# Patient Record
Sex: Male | Born: 1950
Health system: Southern US, Community
[De-identification: ages and names within clinical notes are randomized; demographics above are authoritative.]

## PROBLEM LIST (undated history)

## (undated) DIAGNOSIS — I509 Heart failure, unspecified: Secondary | ICD-10-CM

## (undated) DIAGNOSIS — M1990 Unspecified osteoarthritis, unspecified site: Secondary | ICD-10-CM

## (undated) DIAGNOSIS — K429 Umbilical hernia without obstruction or gangrene: Secondary | ICD-10-CM

## (undated) DIAGNOSIS — M21372 Foot drop, left foot: Secondary | ICD-10-CM

## (undated) DIAGNOSIS — I10 Essential (primary) hypertension: Secondary | ICD-10-CM

## (undated) DIAGNOSIS — M869 Osteomyelitis, unspecified: Secondary | ICD-10-CM

## (undated) DIAGNOSIS — M5126 Other intervertebral disc displacement, lumbar region: Secondary | ICD-10-CM

## (undated) DIAGNOSIS — N289 Disorder of kidney and ureter, unspecified: Secondary | ICD-10-CM

## (undated) HISTORY — PX: FOOT SURGERY: SHX648

## (undated) HISTORY — PX: ACHILLES TENDON SURGERY: SHX542

## (undated) HISTORY — PX: EYE SURGERY: SHX253

---

## 2008-09-03 DIAGNOSIS — M869 Osteomyelitis, unspecified: Secondary | ICD-10-CM

## 2008-09-03 HISTORY — DX: Osteomyelitis, unspecified: M86.9

## 2012-06-03 ENCOUNTER — Other Ambulatory Visit: Payer: Self-pay | Admitting: Orthopedic Surgery

## 2012-06-03 MED ORDER — DEXAMETHASONE SODIUM PHOSPHATE 10 MG/ML IJ SOLN: 10.0000 mg | Freq: Once | INTRAMUSCULAR | Status: DC

## 2012-06-03 MED ORDER — BUPIVACAINE LIPOSOME 1.3 % IJ SUSP: 20.0000 mL | Freq: Once | INTRAMUSCULAR | Status: DC

## 2012-06-03 NOTE — Progress Notes (Signed)
Need orders for surgery pre op is 10-3

## 2012-06-03 NOTE — Progress Notes (Signed)
Preoperative surgical orders have been place into the Epic hospital system for Ethan Alexander on 06/03/2012, 7:58 PM  by Patrica Duel for surgery on 06/11/12.  Preop Total Hip orders including Experel Injecion, IV Tylenol, and IV Decadron as long as there are no contraindications to the above medications. Avel Peace, PA-C

## 2012-06-04 ENCOUNTER — Other Ambulatory Visit: Payer: Self-pay | Admitting: Orthopedic Surgery

## 2012-06-04 ENCOUNTER — Encounter (HOSPITAL_COMMUNITY): Payer: Self-pay | Admitting: Pharmacy Technician

## 2012-06-05 ENCOUNTER — Ambulatory Visit (HOSPITAL_COMMUNITY)
Admission: RE | Admit: 2012-06-05 | Discharge: 2012-06-05 | Disposition: A | Discharge status: Home / Self Care | Payer: BC Managed Care – PPO | Source: Ambulatory Visit | Attending: Orthopedic Surgery | Admitting: Orthopedic Surgery

## 2012-06-05 ENCOUNTER — Encounter (HOSPITAL_COMMUNITY)
Admission: RE | Admit: 2012-06-05 | Discharge: 2012-06-05 | Disposition: A | Discharge status: Home / Self Care | Payer: BC Managed Care – PPO | Source: Ambulatory Visit | Attending: Orthopedic Surgery | Admitting: Orthopedic Surgery

## 2012-06-05 ENCOUNTER — Encounter (HOSPITAL_COMMUNITY): Payer: Self-pay

## 2012-06-05 DIAGNOSIS — Z01812 Encounter for preprocedural laboratory examination: Secondary | ICD-10-CM | POA: Insufficient documentation

## 2012-06-05 DIAGNOSIS — M169 Osteoarthritis of hip, unspecified: Secondary | ICD-10-CM | POA: Insufficient documentation

## 2012-06-05 DIAGNOSIS — I10 Essential (primary) hypertension: Secondary | ICD-10-CM | POA: Insufficient documentation

## 2012-06-05 DIAGNOSIS — M1610 Unilateral primary osteoarthritis, unspecified hip: Secondary | ICD-10-CM | POA: Insufficient documentation

## 2012-06-05 DIAGNOSIS — Z79899 Other long term (current) drug therapy: Secondary | ICD-10-CM | POA: Insufficient documentation

## 2012-06-05 DIAGNOSIS — E119 Type 2 diabetes mellitus without complications: Secondary | ICD-10-CM | POA: Insufficient documentation

## 2012-06-05 HISTORY — DX: Unspecified osteoarthritis, unspecified site: M19.90

## 2012-06-05 HISTORY — DX: Other intervertebral disc displacement, lumbar region: M51.26

## 2012-06-05 HISTORY — DX: Osteomyelitis, unspecified: M86.9

## 2012-06-05 HISTORY — DX: Foot drop, left foot: M21.372

## 2012-06-05 HISTORY — DX: Essential (primary) hypertension: I10

## 2012-06-05 LAB — CBC
HCT: 39.3 % (ref 39.0–52.0)
Hemoglobin: 12.7 g/dL — ABNORMAL LOW (ref 13.0–17.0)
MCHC: 32.3 g/dL (ref 30.0–36.0)
MCV: 92.9 fL (ref 78.0–100.0)
RDW: 14.6 % (ref 11.5–15.5)

## 2012-06-05 LAB — URINALYSIS, ROUTINE W REFLEX MICROSCOPIC
Bilirubin Urine: NEGATIVE
Hgb urine dipstick: NEGATIVE
Nitrite: NEGATIVE
Protein, ur: NEGATIVE mg/dL
Specific Gravity, Urine: 1.016 (ref 1.005–1.030)
Urobilinogen, UA: 0.2 mg/dL (ref 0.0–1.0)

## 2012-06-05 LAB — COMPREHENSIVE METABOLIC PANEL
Albumin: 3 g/dL — ABNORMAL LOW (ref 3.5–5.2)
BUN: 14 mg/dL (ref 6–23)
Creatinine, Ser: 0.74 mg/dL (ref 0.50–1.35)
GFR calc Af Amer: >90 mL/min (ref >90)
Glucose, Bld: 76 mg/dL (ref 70–99)
Total Bilirubin: 0.2 mg/dL — ABNORMAL LOW (ref 0.3–1.2)
Total Protein: 7 g/dL (ref 6.0–8.3)

## 2012-06-05 LAB — PROTIME-INR
INR: 0.94 (ref 0.00–1.49)
Prothrombin Time: 12.5 seconds (ref 11.6–15.2)

## 2012-06-05 LAB — SURGICAL PCR SCREEN: Staphylococcus aureus: POSITIVE — AB

## 2012-06-05 NOTE — Patient Instructions (Addendum)
20 Ethan Alexander  06/05/2012   Your procedure is scheduled on:  06/11/12   Wednesday  Surgery [id number]  Report to [facility] at     1030am Call this number if you have problems the morning of surgery: [phone]       Remember: HAVE SNACK BEFORE BEDTIME OR MIDNIGHT Tuesday NIGHT  Do not eat food  Or drink :After Midnight.  Tuesday NIGHT   Take these medicines the morning of surgery with A SIP OF WATER:   none   DO NOT TAKE ANY BLOOD SUGAR MEDICINE MORNING OF SURGERY.  Contacts, dentures or partial plates can not be worn to surgery  Leave suitcase in the car. After surgery it may be brought to your room.  For patients admitted to the hospital, checkout time is 11:00 AM day of  discharge.             SPECIAL INSTRUCTIONS- SEE  PREPARING FOR SURGERY INSTRUCTION SHEET-     DO NOT WEAR JEWELRY, LOTIONS, POWDERS, OR PERFUMES.  WOMEN-- DO NOT SHAVE LEGS OR UNDERARMS FOR 12 HOURS BEFORE SHOWERS. MEN MAY SHAVE FACE.  Patients discharged the day of surgery will not be allowed to drive home. IF going home the day of surgery, you must have a driver and someone to stay with you for the first 24 hours  Name and phone number of your driver:  wife                                                                      Please read over the following fact sheets that you were given: MRSA Information, Incentive Spirometry Sheet, Blood Transfusion Sheet  Information                                                                                   [name]  PST [phone]  [phone]

## 2012-06-05 NOTE — Progress Notes (Signed)
06/05/12 1325  OBSTRUCTIVE SLEEP APNEA  Have you ever been diagnosed with sleep apnea through a sleep study? No  Do you snore loudly (loud enough to be heard through closed doors)?  1  Do you often feel tired, fatigued, or sleepy during the daytime? 0  Has anyone observed you stop breathing during your sleep? 0  Do you have, or are you being treated for high blood pressure? 1  BMI more than 35 kg/m2? 0  Age over 59 years old? 1  Neck circumference greater than 40 cm/18 inches? 0  Gender: 1  Obstructive Sleep Apnea Score 4   Score 4 or greater  Results sent to PCP

## 2012-06-05 NOTE — Pre-Procedure Instructions (Signed)
Clearance Loleta Books PA  chart

## 2012-06-05 NOTE — Pre-Procedure Instructions (Signed)
When EKG completed per Tamela Gammon today,  I noted after patient left hospital that name did not scan. Cannot verify this was his EKG.  Called wife and informed her it would be repeated day of surgery and why. Told her there would be no charge for today

## 2012-06-06 ENCOUNTER — Encounter (HOSPITAL_COMMUNITY): Payer: Self-pay

## 2012-06-09 ENCOUNTER — Other Ambulatory Visit: Payer: Self-pay | Admitting: Orthopedic Surgery

## 2012-06-09 NOTE — H&P (Signed)
Ethan Alexander  DOB: [birth year] Married / Language: English / Race: White Male  Date of Admission:  06/11/2012  Chief Complaint:  Left Hip Pain  History of Present Illness The patient is a 61 year old male who comes in for a preoperative History and Physical. The patient is scheduled for a left total hip arthroplasty to be performed by Dr. Gus Rankin. Aluisio, MD at [facility] on 06/11/2012. The patient is a 61 year old male who presents for their left hip pain and osteoarthritis. Symptoms reported today include: pain, stiffness and difficulty ambulating. The patient feels that they are doing poorly and report their pain level to be moderate. The following medication has been used for pain control: Ultram. He feels as though the left hip is bothering him all the time. It is limiting what he can and cannot do. He has endstage arthritis of the left hip. At this point, the most radicular means of improving his pain and function would be a total hip arthroplasty.  He would like to go ahead and proceed with left total hip arthroplasty. They have been treated conservatively in the past for the above stated problem and despite conservative measures, they continue to have progressive pain and severe functional limitations and dysfunction. They have failed non-operative management. It is felt that they would benefit from undergoing total joint replacement. Risks and benefits of the procedure have been discussed with the patient and they elect to proceed with surgery. There are no active contraindications to surgery such as ongoing infection or rapidly progressive neurological disease.  Problem List/Past Medical Osteoarthritis, Hip Blood Clot. 1980's - Never did an ultrasound so not confirmed but treated like one. Diabetes Mellitus, Type II Peripheral Neuropathy Rheumatoid Arthritis Osteoporosis High blood pressure Hypercholesterolemia Kidney Stone   Allergies Sulfa Drugs.  Flu-like syndrome   Family History Osteoarthritis. mother Osteoporosis. mother Heart Disease. father Cancer. father Diabetes Mellitus. father   Social History Living situation. live with spouse Marital status. married Most recent primary occupation. Teacher, English as a foreign language Illicit drug use. no Current work status. working full time Drug/Alcohol Rehab (Currently). no Exercise. Exercises daily; does running / walking Number of flights of stairs before winded. 2-3 Tobacco use. former smoker; smoke(d) 2 pack(s) per day Pain Contract. no Previously in rehab. no Tobacco / smoke exposure. no Children. 2 Alcohol use. current drinker; drinks beer; only occasionally per week Post-Surgical Plans. Plan is to go home. Advance Directives. Living Will, Healthcare POA   Medication History Ramipril (5MG  Capsule, Oral) Active. MetFORMIN HCl ER (500MG  Tablet ER 24HR, Oral) Active. Nabumetone (750MG  Tablet, Oral) Active. PredniSONE (10MG  Tablet, Oral) Active. Leflunomide (20MG  Tablet, Oral) Active. Methotrexate Sodium ( Injection) Specific dose unknown - Active. TraMADol HCl (50MG  Tablet, 1 (one) Tablet Oral every 6-8 hours as needed for pain, Taken starting 05/13/2012) Active.   Past Surgical History Foot Surgery. left Cataract Extraction-Bilateral   Review of Systems General:Not Present- Chills, Fever, Night Sweats, Fatigue, Weight Gain, Weight Loss and Memory Loss. Skin:Not Present- Hives, Itching, Rash, Eczema and Lesions. HEENT:Not Present- Tinnitus, Headache, Double Vision, Visual Loss, Hearing Loss and Dentures. Respiratory:Not Present- Shortness of breath with exertion, Shortness of breath at rest, Allergies, Coughing up blood and Chronic Cough. Cardiovascular:Not Present- Chest Pain, Racing/skipping heartbeats, Difficulty Breathing Lying Down, Murmur, Swelling and Palpitations. Gastrointestinal:Not Present- Bloody Stool, Heartburn, Abdominal Pain,  Vomiting, Nausea, Constipation, Diarrhea, Difficulty Swallowing, Jaundice and Loss of appetitie. Male Genitourinary:Present- Urinating at Night. Not Present- Urinary frequency, Blood in Urine, Weak urinary  stream, Discharge, Flank Pain, Incontinence, Painful Urination, Urgency and Urinary Retention. Musculoskeletal:Present- Joint Swelling, Joint Pain, Back Pain and Morning Stiffness. Not Present- Muscle Weakness, Muscle Pain and Spasms. Neurological:Not Present- Tremor, Dizziness, Blackout spells, Paralysis, Difficulty with balance and Weakness. Psychiatric:Not Present- Insomnia.   Vitals Weight: 192 lb Height: 78 in Body Surface Area: 2.19 m Body Mass Index: 22.19 kg/m Pulse: 84 (Regular) Resp.: 14 (Unlabored) BP: 114/64 (Sitting, Left Arm, Standard)    Physical Exam The physical exam findings are as follows:  Note: Patient is a 61 year old male with continued hip pain. Patient is accompanied today by his wife today.   General Mental Status - Alert, cooperative and good historian. General Appearance- pleasant. Not in acute distress. Orientation- Oriented X3. Build & Nutrition- Well nourished and Well developed.   Head and Neck Head- normocephalic, atraumatic . Neck Global Assessment- supple. no bruit auscultated on the right and no bruit auscultated on the left.   Eye Pupil- Bilateral- Regular and Round. Motion- Bilateral- EOMI.   Chest and Lung Exam Auscultation: Breath sounds:- clear at anterior chest wall and - clear at posterior chest wall. Adventitious sounds:- No Adventitious sounds.   Cardiovascular Auscultation:Rhythm- Regular rate and rhythm. Heart Sounds- S1 WNL and S2 WNL. Murmurs & Other Heart Sounds:Auscultation of the heart reveals - No Murmurs.   Abdomen Palpation/Percussion:Tenderness- Abdomen is non-tender to palpation. Rigidity (guarding)- Abdomen is soft. Auscultation:Auscultation of the abdomen  reveals - Bowel sounds normal.   Male Genitourinary Not done, not pertinent to present illness  Musculoskeletal He is alert and oriented in no apparent distress. His right hip has normal motion. No discomfort. Left hip flexion is 90. No internal rotation, about 20 external rotation and 20 abduction. His gait pattern is significantly antalgic. He has a foot drop on the left side from previous back injury.  Assessment & Plan Osteoarthritis, Hip (715.35) Impression: Left Hip  Note: Patinet is for a left total hip replacement by Dr. Lequita Halt.  Plan is to go home.  PCP - Donnel Saxon - Patient has been seen preoperatively and felt to be stable for surgery.  Signed electronically by Roberts Gaudy, PA-C

## 2012-06-11 ENCOUNTER — Ambulatory Visit (HOSPITAL_COMMUNITY): Payer: BC Managed Care – PPO

## 2012-06-11 ENCOUNTER — Encounter (HOSPITAL_COMMUNITY): Payer: Self-pay | Admitting: Registered Nurse

## 2012-06-11 ENCOUNTER — Encounter (HOSPITAL_COMMUNITY): Payer: Self-pay | Admitting: *Deleted

## 2012-06-11 ENCOUNTER — Ambulatory Visit (HOSPITAL_COMMUNITY): Payer: BC Managed Care – PPO | Admitting: Registered Nurse

## 2012-06-11 ENCOUNTER — Encounter (HOSPITAL_COMMUNITY)
Admission: RE | Disposition: A | Discharge status: Home / Self Care | Payer: Self-pay | Source: Ambulatory Visit | Attending: Orthopedic Surgery

## 2012-06-11 ENCOUNTER — Encounter (HOSPITAL_COMMUNITY): Payer: Self-pay

## 2012-06-11 ENCOUNTER — Inpatient Hospital Stay (HOSPITAL_COMMUNITY)
Admission: RE | Admit: 2012-06-11 | Discharge: 2012-06-13 | DRG: 818 | Disposition: A | Discharge status: Home / Self Care | Payer: BC Managed Care – PPO | Source: Ambulatory Visit | Attending: Orthopedic Surgery | Admitting: Orthopedic Surgery

## 2012-06-11 DIAGNOSIS — M169 Osteoarthritis of hip, unspecified: Secondary | ICD-10-CM | POA: Diagnosis present

## 2012-06-11 DIAGNOSIS — M1610 Unilateral primary osteoarthritis, unspecified hip: Principal | ICD-10-CM | POA: Diagnosis present

## 2012-06-11 DIAGNOSIS — Z96649 Presence of unspecified artificial hip joint: Secondary | ICD-10-CM

## 2012-06-11 DIAGNOSIS — I447 Left bundle-branch block, unspecified: Secondary | ICD-10-CM | POA: Diagnosis present

## 2012-06-11 DIAGNOSIS — M069 Rheumatoid arthritis, unspecified: Secondary | ICD-10-CM | POA: Diagnosis present

## 2012-06-11 DIAGNOSIS — M810 Age-related osteoporosis without current pathological fracture: Secondary | ICD-10-CM | POA: Diagnosis present

## 2012-06-11 DIAGNOSIS — Z79899 Other long term (current) drug therapy: Secondary | ICD-10-CM

## 2012-06-11 DIAGNOSIS — E7800 Pure hypercholesterolemia, unspecified: Secondary | ICD-10-CM | POA: Diagnosis present

## 2012-06-11 DIAGNOSIS — I10 Essential (primary) hypertension: Secondary | ICD-10-CM | POA: Diagnosis present

## 2012-06-11 DIAGNOSIS — E119 Type 2 diabetes mellitus without complications: Secondary | ICD-10-CM | POA: Diagnosis present

## 2012-06-11 DIAGNOSIS — Z87891 Personal history of nicotine dependence: Secondary | ICD-10-CM

## 2012-06-11 DIAGNOSIS — D62 Acute posthemorrhagic anemia: Secondary | ICD-10-CM

## 2012-06-11 HISTORY — PX: TOTAL HIP ARTHROPLASTY: SHX124

## 2012-06-11 LAB — TYPE AND SCREEN

## 2012-06-11 LAB — GLUCOSE, CAPILLARY

## 2012-06-11 LAB — ABO/RH: ABO/RH(D): O POS

## 2012-06-11 SURGERY — ARTHROPLASTY, HIP, TOTAL,POSTERIOR APPROACH
Anesthesia: General | Site: Hip | Laterality: Left | Wound class: Clean

## 2012-06-11 MED ORDER — CEFAZOLIN SODIUM-DEXTROSE 2-3 GM-% IV SOLR
INTRAVENOUS | Status: AC
  Filled 2012-06-11: qty 50

## 2012-06-11 MED ORDER — METOCLOPRAMIDE HCL 10 MG PO TABS: 5.0000 mg | ORAL_TABLET | Freq: Three times a day (TID) | ORAL | Status: DC | PRN

## 2012-06-11 MED ORDER — HYDROMORPHONE HCL PF 1 MG/ML IJ SOLN
0.2500 mg | INTRAMUSCULAR | Status: DC | PRN
  Administered 2012-06-11 (×2): 0.5 mg via INTRAVENOUS

## 2012-06-11 MED ORDER — FENTANYL CITRATE 0.05 MG/ML IJ SOLN
INTRAMUSCULAR | Status: DC | PRN
  Administered 2012-06-11: 25 ug via INTRAVENOUS
  Administered 2012-06-11: 100 ug via INTRAVENOUS
  Administered 2012-06-11 (×2): 50 ug via INTRAVENOUS
  Administered 2012-06-11: 25 ug via INTRAVENOUS

## 2012-06-11 MED ORDER — PREDNISONE 20 MG PO TABS
20.0000 mg | ORAL_TABLET | Freq: Two times a day (BID) | ORAL | Status: AC
  Administered 2012-06-12 (×2): 20 mg via ORAL
  Filled 2012-06-11 (×2): qty 1

## 2012-06-11 MED ORDER — RAMIPRIL 5 MG PO TABS: 5.0000 mg | ORAL_TABLET | Freq: Every day | ORAL | Status: DC

## 2012-06-11 MED ORDER — ACETAMINOPHEN 325 MG PO TABS: 650.0000 mg | ORAL_TABLET | Freq: Four times a day (QID) | ORAL | Status: DC | PRN

## 2012-06-11 MED ORDER — PROMETHAZINE HCL 25 MG/ML IJ SOLN: 6.2500 mg | INTRAMUSCULAR | Status: DC | PRN

## 2012-06-11 MED ORDER — CISATRACURIUM BESYLATE (PF) 10 MG/5ML IV SOLN
INTRAVENOUS | Status: DC | PRN
  Administered 2012-06-11: 6 mg via INTRAVENOUS

## 2012-06-11 MED ORDER — ACETAMINOPHEN 10 MG/ML IV SOLN
1000.0000 mg | Freq: Four times a day (QID) | INTRAVENOUS | Status: AC
  Administered 2012-06-11 – 2012-06-12 (×4): 1000 mg via INTRAVENOUS
  Filled 2012-06-11 (×5): qty 100

## 2012-06-11 MED ORDER — METOCLOPRAMIDE HCL 5 MG/ML IJ SOLN: 5.0000 mg | Freq: Three times a day (TID) | INTRAMUSCULAR | Status: DC | PRN

## 2012-06-11 MED ORDER — HYDROCORTISONE SOD SUCCINATE 100 MG IJ SOLR
INTRAMUSCULAR | Status: AC
  Filled 2012-06-11: qty 2

## 2012-06-11 MED ORDER — POTASSIUM CHLORIDE IN NACL 20-0.9 MEQ/L-% IV SOLN
INTRAVENOUS | Status: DC
  Administered 2012-06-11 – 2012-06-12 (×2): via INTRAVENOUS
  Filled 2012-06-11 (×2): qty 1000

## 2012-06-11 MED ORDER — LIDOCAINE HCL (CARDIAC) 20 MG/ML IV SOLN
INTRAVENOUS | Status: DC | PRN
  Administered 2012-06-11: 50 mg via INTRAVENOUS

## 2012-06-11 MED ORDER — POLYETHYLENE GLYCOL 3350 17 G PO PACK
17.0000 g | PACK | Freq: Every day | ORAL | Status: DC | PRN
  Filled 2012-06-11: qty 1

## 2012-06-11 MED ORDER — METHOCARBAMOL 100 MG/ML IJ SOLN
500.0000 mg | Freq: Four times a day (QID) | INTRAVENOUS | Status: DC | PRN
  Administered 2012-06-11: 500 mg via INTRAVENOUS
  Filled 2012-06-11: qty 5

## 2012-06-11 MED ORDER — LACTATED RINGERS IV SOLN
INTRAVENOUS | Status: DC | PRN
  Administered 2012-06-11 (×2): via INTRAVENOUS

## 2012-06-11 MED ORDER — HYDROCORTISONE SOD SUCCINATE 100 MG IJ SOLR
INTRAMUSCULAR | Status: DC | PRN
  Administered 2012-06-11: 100 mg via INTRAVENOUS

## 2012-06-11 MED ORDER — PREDNISONE 20 MG PO TABS
20.0000 mg | ORAL_TABLET | Freq: Every day | ORAL | Status: AC
  Administered 2012-06-11: 20 mg via ORAL
  Filled 2012-06-11: qty 1

## 2012-06-11 MED ORDER — ACETAMINOPHEN 10 MG/ML IV SOLN
INTRAVENOUS | Status: AC
  Filled 2012-06-11: qty 100

## 2012-06-11 MED ORDER — SUCCINYLCHOLINE CHLORIDE 20 MG/ML IJ SOLN
INTRAMUSCULAR | Status: DC | PRN
  Administered 2012-06-11: 100 mg via INTRAVENOUS

## 2012-06-11 MED ORDER — CEFAZOLIN SODIUM 1-5 GM-% IV SOLN
1.0000 g | Freq: Four times a day (QID) | INTRAVENOUS | Status: AC
  Administered 2012-06-11 – 2012-06-12 (×2): 1 g via INTRAVENOUS
  Filled 2012-06-11 (×2): qty 50

## 2012-06-11 MED ORDER — PHENOL 1.4 % MT LIQD: 1.0000 | OROMUCOSAL | Status: DC | PRN

## 2012-06-11 MED ORDER — LACTATED RINGERS IV SOLN: INTRAVENOUS | Status: DC

## 2012-06-11 MED ORDER — PROPOFOL 10 MG/ML IV BOLUS
INTRAVENOUS | Status: DC | PRN
  Administered 2012-06-11: 200 mg via INTRAVENOUS

## 2012-06-11 MED ORDER — PREDNISONE 10 MG PO TABS
10.0000 mg | ORAL_TABLET | Freq: Two times a day (BID) | ORAL | Status: DC
  Filled 2012-06-11: qty 1

## 2012-06-11 MED ORDER — SODIUM CHLORIDE 0.9 % IJ SOLN
INTRAMUSCULAR | Status: DC | PRN
  Administered 2012-06-11: 50 mL

## 2012-06-11 MED ORDER — ONDANSETRON HCL 4 MG/2ML IJ SOLN: 4.0000 mg | Freq: Four times a day (QID) | INTRAMUSCULAR | Status: DC | PRN

## 2012-06-11 MED ORDER — MORPHINE SULFATE 2 MG/ML IJ SOLN
1.0000 mg | INTRAMUSCULAR | Status: DC | PRN
  Administered 2012-06-11: 2 mg via INTRAVENOUS
  Filled 2012-06-11: qty 1

## 2012-06-11 MED ORDER — INSULIN ASPART 100 UNIT/ML ~~LOC~~ SOLN
0.0000 [IU] | Freq: Three times a day (TID) | SUBCUTANEOUS | Status: DC
  Administered 2012-06-11 – 2012-06-12 (×3): 2 [IU] via SUBCUTANEOUS

## 2012-06-11 MED ORDER — RAMIPRIL 5 MG PO CAPS
5.0000 mg | ORAL_CAPSULE | Freq: Every day | ORAL | Status: DC
  Administered 2012-06-12 – 2012-06-13 (×2): 5 mg via ORAL
  Filled 2012-06-11 (×2): qty 1

## 2012-06-11 MED ORDER — ONDANSETRON HCL 4 MG/2ML IJ SOLN
INTRAMUSCULAR | Status: DC | PRN
  Administered 2012-06-11: 4 mg via INTRAVENOUS

## 2012-06-11 MED ORDER — ONDANSETRON HCL 4 MG PO TABS: 4.0000 mg | ORAL_TABLET | Freq: Four times a day (QID) | ORAL | Status: DC | PRN

## 2012-06-11 MED ORDER — PREDNISONE 10 MG PO TABS: 10.0000 mg | ORAL_TABLET | Freq: Every day | ORAL | Status: DC

## 2012-06-11 MED ORDER — TRAMADOL HCL 50 MG PO TABS
50.0000 mg | ORAL_TABLET | Freq: Four times a day (QID) | ORAL | Status: DC | PRN
  Administered 2012-06-12 – 2012-06-13 (×3): 100 mg via ORAL
  Filled 2012-06-11 (×3): qty 2

## 2012-06-11 MED ORDER — RIVAROXABAN 10 MG PO TABS
10.0000 mg | ORAL_TABLET | Freq: Every day | ORAL | Status: DC
  Administered 2012-06-12 – 2012-06-13 (×2): 10 mg via ORAL
  Filled 2012-06-11 (×3): qty 1

## 2012-06-11 MED ORDER — ACETAMINOPHEN 650 MG RE SUPP: 650.0000 mg | Freq: Four times a day (QID) | RECTAL | Status: DC | PRN

## 2012-06-11 MED ORDER — BUPIVACAINE LIPOSOME 1.3 % IJ SUSP
20.0000 mL | Freq: Once | INTRAMUSCULAR | Status: AC
  Administered 2012-06-11: 20 mL
  Filled 2012-06-11: qty 20

## 2012-06-11 MED ORDER — METFORMIN HCL 500 MG PO TABS
500.0000 mg | ORAL_TABLET | Freq: Two times a day (BID) | ORAL | Status: DC
  Administered 2012-06-11 – 2012-06-12 (×2): 500 mg via ORAL
  Filled 2012-06-11 (×4): qty 1

## 2012-06-11 MED ORDER — SODIUM CHLORIDE 0.9 % IV SOLN: INTRAVENOUS | Status: DC

## 2012-06-11 MED ORDER — OXYCODONE HCL 5 MG PO TABS
5.0000 mg | ORAL_TABLET | ORAL | Status: DC | PRN
  Administered 2012-06-11: 10 mg via ORAL
  Filled 2012-06-11: qty 2

## 2012-06-11 MED ORDER — MENTHOL 3 MG MT LOZG: 1.0000 | LOZENGE | OROMUCOSAL | Status: DC | PRN

## 2012-06-11 MED ORDER — FLEET ENEMA 7-19 GM/118ML RE ENEM: 1.0000 | ENEMA | Freq: Once | RECTAL | Status: AC | PRN

## 2012-06-11 MED ORDER — DOCUSATE SODIUM 100 MG PO CAPS
100.0000 mg | ORAL_CAPSULE | Freq: Two times a day (BID) | ORAL | Status: DC
  Administered 2012-06-11 – 2012-06-13 (×4): 100 mg via ORAL
  Filled 2012-06-11 (×3): qty 1

## 2012-06-11 MED ORDER — 0.9 % SODIUM CHLORIDE (POUR BTL) OPTIME
TOPICAL | Status: DC | PRN
  Administered 2012-06-11: 1000 mL

## 2012-06-11 MED ORDER — ACETAMINOPHEN 10 MG/ML IV SOLN
1000.0000 mg | Freq: Once | INTRAVENOUS | Status: AC
  Administered 2012-06-11: 1000 mg via INTRAVENOUS

## 2012-06-11 MED ORDER — MIDAZOLAM HCL 5 MG/5ML IJ SOLN
INTRAMUSCULAR | Status: DC | PRN
  Administered 2012-06-11: 2 mg via INTRAVENOUS

## 2012-06-11 MED ORDER — PREDNISONE 5 MG PO TABS
15.0000 mg | ORAL_TABLET | Freq: Two times a day (BID) | ORAL | Status: DC
  Administered 2012-06-13: 15 mg via ORAL
  Filled 2012-06-11 (×3): qty 1

## 2012-06-11 MED ORDER — DIPHENHYDRAMINE HCL 12.5 MG/5ML PO ELIX: 12.5000 mg | ORAL_SOLUTION | ORAL | Status: DC | PRN

## 2012-06-11 MED ORDER — HYDROMORPHONE HCL PF 1 MG/ML IJ SOLN
INTRAMUSCULAR | Status: DC | PRN
  Administered 2012-06-11 (×2): 1 mg via INTRAVENOUS

## 2012-06-11 MED ORDER — CEFAZOLIN SODIUM-DEXTROSE 2-3 GM-% IV SOLR
2.0000 g | INTRAVENOUS | Status: AC
  Administered 2012-06-11: 2 g via INTRAVENOUS

## 2012-06-11 MED ORDER — BISACODYL 10 MG RE SUPP: 10.0000 mg | Freq: Every day | RECTAL | Status: DC | PRN

## 2012-06-11 MED ORDER — METHOCARBAMOL 500 MG PO TABS
500.0000 mg | ORAL_TABLET | Freq: Four times a day (QID) | ORAL | Status: DC | PRN
  Administered 2012-06-11 – 2012-06-12 (×2): 500 mg via ORAL
  Filled 2012-06-11 (×2): qty 1

## 2012-06-11 MED ORDER — HYDROMORPHONE HCL PF 1 MG/ML IJ SOLN
INTRAMUSCULAR | Status: AC
  Filled 2012-06-11: qty 1

## 2012-06-11 SURGICAL SUPPLY — 49 items
BAG ZIPLOCK 12X15 (MISCELLANEOUS) ×2 IMPLANT
BIT DRILL 2.8X128 (BIT) ×2 IMPLANT
BLADE EXTENDED COATED 6.5IN (ELECTRODE) ×2 IMPLANT
BLADE SAW SAG 73X25 THK (BLADE) ×1
BLADE SAW SGTL 73X25 THK (BLADE) ×1 IMPLANT
CLOTH BEACON ORANGE TIMEOUT ST (SAFETY) ×2 IMPLANT
DECANTER SPIKE VIAL GLASS SM (MISCELLANEOUS) ×2 IMPLANT
DRAPE INCISE IOBAN 66X45 STRL (DRAPES) ×2 IMPLANT
DRAPE ORTHO SPLIT 77X108 STRL (DRAPES) ×2
DRAPE POUCH INSTRU U-SHP 10X18 (DRAPES) ×2 IMPLANT
DRAPE SURG ORHT 6 SPLT 77X108 (DRAPES) ×2 IMPLANT
DRAPE U-SHAPE 47X51 STRL (DRAPES) ×2 IMPLANT
DRSG ADAPTIC 3X8 NADH LF (GAUZE/BANDAGES/DRESSINGS) ×2 IMPLANT
DRSG MEPILEX BORDER 4X4 (GAUZE/BANDAGES/DRESSINGS) ×2 IMPLANT
DRSG MEPILEX BORDER 4X8 (GAUZE/BANDAGES/DRESSINGS) ×2 IMPLANT
DURAPREP 26ML APPLICATOR (WOUND CARE) ×2 IMPLANT
ELECT REM PT RETURN 9FT ADLT (ELECTROSURGICAL) ×2
ELECTRODE REM PT RTRN 9FT ADLT (ELECTROSURGICAL) ×1 IMPLANT
EVACUATOR 1/8 PVC DRAIN (DRAIN) ×2 IMPLANT
FACESHIELD LNG OPTICON STERILE (SAFETY) ×8 IMPLANT
GLOVE BIO SURGEON STRL SZ8 (GLOVE) ×2 IMPLANT
GLOVE BIOGEL PI IND STRL 8 (GLOVE) ×2 IMPLANT
GLOVE BIOGEL PI INDICATOR 8 (GLOVE) ×2
GLOVE ECLIPSE 8.0 STRL XLNG CF (GLOVE) ×2 IMPLANT
GLOVE SURG SS PI 6.5 STRL IVOR (GLOVE) ×4 IMPLANT
GOWN STRL NON-REIN LRG LVL3 (GOWN DISPOSABLE) ×4 IMPLANT
GOWN STRL REIN XL XLG (GOWN DISPOSABLE) ×2 IMPLANT
IMMOBILIZER KNEE 20 (SOFTGOODS) ×2
IMMOBILIZER KNEE 20 THIGH 36 (SOFTGOODS) ×1 IMPLANT
KIT BASIN OR (CUSTOM PROCEDURE TRAY) ×2 IMPLANT
MANIFOLD NEPTUNE II (INSTRUMENTS) ×2 IMPLANT
NDL SAFETY ECLIPSE 18X1.5 (NEEDLE) ×1 IMPLANT
NEEDLE HYPO 18GX1.5 SHARP (NEEDLE) ×1
NS IRRIG 1000ML POUR BTL (IV SOLUTION) ×2 IMPLANT
PACK TOTAL JOINT (CUSTOM PROCEDURE TRAY) ×2 IMPLANT
PASSER SUT SWANSON 36MM LOOP (INSTRUMENTS) ×2 IMPLANT
POSITIONER SURGICAL ARM (MISCELLANEOUS) ×2 IMPLANT
SPONGE GAUZE 4X4 12PLY (GAUZE/BANDAGES/DRESSINGS) ×2 IMPLANT
STRIP CLOSURE SKIN 1/2X4 (GAUZE/BANDAGES/DRESSINGS) ×2 IMPLANT
SUT ETHIBOND NAB CT1 #1 30IN (SUTURE) ×4 IMPLANT
SUT MNCRL AB 4-0 PS2 18 (SUTURE) ×2 IMPLANT
SUT VIC AB 2-0 CT1 27 (SUTURE) ×3
SUT VIC AB 2-0 CT1 TAPERPNT 27 (SUTURE) ×3 IMPLANT
SUT VLOC 180 0 24IN GS25 (SUTURE) ×2 IMPLANT
SYR 50ML LL SCALE MARK (SYRINGE) ×2 IMPLANT
TOWEL OR 17X26 10 PK STRL BLUE (TOWEL DISPOSABLE) ×4 IMPLANT
TOWEL OR NON WOVEN STRL DISP B (DISPOSABLE) ×2 IMPLANT
TRAY FOLEY CATH 14FRSI W/METER (CATHETERS) ×2 IMPLANT
WATER STERILE IRR 1500ML POUR (IV SOLUTION) ×4 IMPLANT

## 2012-06-11 NOTE — Anesthesia Preprocedure Evaluation (Addendum)
Anesthesia Evaluation  Patient identified by MRN, date of birth, ID band Patient awake    Reviewed: Allergy & Precautions, H&P , NPO status , Patient's Chart, lab work & pertinent test results  Airway Mallampati: II TM Distance: >3 FB Neck ROM: Full    Dental No notable dental hx.    Pulmonary former smoker,  breath sounds clear to auscultation  Pulmonary exam normal       Cardiovascular hypertension, Pt. on medications Rhythm:Regular Rate:Normal  LBBB on ECG   Neuro/Psych Left foot drop negative psych ROS   GI/Hepatic negative GI ROS, Neg liver ROS,   Endo/Other  diabetes, Type 2, Oral Hypoglycemic Agents  Renal/GU Renal diseaseH/O kidney stones.  negative genitourinary   Musculoskeletal  (+) Arthritis -, Rheumatoid disorders,    Abdominal   Peds negative pediatric ROS (+)  Hematology negative hematology ROS (+)   Anesthesia Other Findings   Reproductive/Obstetrics negative OB ROS                        Anesthesia Physical Anesthesia Plan  ASA: III  Anesthesia Plan: General   Post-op Pain Management:    Induction: Intravenous  Airway Management Planned:   Additional Equipment:   Intra-op Plan:   Post-operative Plan: Extubation in OR  Informed Consent: I have reviewed the patients History and Physical, chart, labs and discussed the procedure including the risks, benefits and alternatives for the proposed anesthesia with the patient or authorized representative who has indicated his/her understanding and acceptance.   Dental advisory given  Plan Discussed with: CRNA  Anesthesia Plan Comments:         Anesthesia Quick Evaluation

## 2012-06-11 NOTE — Transfer of Care (Signed)
Immediate Anesthesia Transfer of Care Note  Patient: Ethan Alexander  Procedure(s) Performed: Procedure(s) (LRB) with comments: TOTAL HIP ARTHROPLASTY (Left)  Patient Location: PACU  Anesthesia Type: General  Level of Consciousness: awake, alert  and oriented  Airway & Oxygen Therapy: Patient Spontanous Breathing and Patient connected to face mask oxygen  Post-op Assessment: Report given to PACU RN  Post vital signs: Reviewed and stable  Complications: No apparent anesthesia complications

## 2012-06-11 NOTE — H&P (View-Only) (Signed)
Ethan Alexander  DOB: 03/31/1951 Married / Language: English / Race: White Male  Date of Admission:  06/11/2012  Chief Complaint:  Left Hip Pain  History of Present Illness The patient is a 61 year old male who comes in for a preoperative History and Physical. The patient is scheduled for a left total hip arthroplasty to be performed by Dr. Frank V. Aluisio, MD at [facility] on 06/11/2012. The patient is a 60 year old male who presents for their left hip pain and osteoarthritis. Symptoms reported today include: pain, stiffness and difficulty ambulating. The patient feels that they are doing poorly and report their pain level to be moderate. The following medication has been used for pain control: Ultram. He feels as though the left hip is bothering him all the time. It is limiting what he can and cannot do. He has endstage arthritis of the left hip. At this point, the most radicular means of improving his pain and function would be a total hip arthroplasty.  He would like to go ahead and proceed with left total hip arthroplasty. They have been treated conservatively in the past for the above stated problem and despite conservative measures, they continue to have progressive pain and severe functional limitations and dysfunction. They have failed non-operative management. It is felt that they would benefit from undergoing total joint replacement. Risks and benefits of the procedure have been discussed with the patient and they elect to proceed with surgery. There are no active contraindications to surgery such as ongoing infection or rapidly progressive neurological disease.  Problem List/Past Medical Osteoarthritis, Hip Blood Clot. 1980's - Never did an ultrasound so not confirmed but treated like one. Diabetes Mellitus, Type II Peripheral Neuropathy Rheumatoid Arthritis Osteoporosis High blood pressure Hypercholesterolemia Kidney Stone   Allergies Sulfa Drugs.  Flu-like syndrome   Family History Osteoarthritis. mother Osteoporosis. mother Heart Disease. father Cancer. father Diabetes Mellitus. father   Social History Living situation. live with spouse Marital status. 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 no Current work status. working full time Drug/Alcohol Rehab (Currently). no Exercise. Exercises daily; does running / walking Number of flights of stairs before winded. 2-3 Tobacco use. former smoker; smoke(d) 2 pack(s) per day Pain Contract. no Previously in rehab. no Tobacco / smoke exposure. no Children. 2 Alcohol use. current drinker; drinks beer; only occasionally per week Post-Surgical Plans. Plan is to go home. Advance Directives. Living Will, Healthcare POA   Medication History Ramipril (5MG Capsule, Oral) Active. MetFORMIN HCl ER (500MG Tablet ER 24HR, Oral) Active. Nabumetone (750MG Tablet, Oral) Active. PredniSONE (10MG Tablet, Oral) Active. Leflunomide (20MG Tablet, Oral) Active. Methotrexate Sodium ( Injection) Specific dose unknown - Active. TraMADol HCl (50MG Tablet, 1 (one) Tablet Oral every 6-8 hours as needed for pain, Taken starting 05/13/2012) Active.   Past Surgical History Foot Surgery. left Cataract Extraction-Bilateral   Review of Systems General:Not Present- Chills, Fever, Night Sweats, Fatigue, Weight Gain, Weight Loss and Memory Loss. Skin:Not Present- Hives, Itching, Rash, Eczema and Lesions. HEENT:Not Present- Tinnitus, Headache, Double Vision, Visual Loss, Hearing Loss and Dentures. Respiratory:Not Present- Shortness of breath with exertion, Shortness of breath at rest, Allergies, Coughing up blood and Chronic Cough. Cardiovascular:Not Present- Chest Pain, Racing/skipping heartbeats, Difficulty Breathing Lying Down, Murmur, Swelling and Palpitations. Gastrointestinal:Not Present- Bloody Stool, Heartburn, Abdominal Pain,  Vomiting, Nausea, Constipation, Diarrhea, Difficulty Swallowing, Jaundice and Loss of appetitie. Male Genitourinary:Present- Urinating at Night. Not Present- Urinary frequency, Blood in Urine, Weak urinary   stream, Discharge, Flank Pain, Incontinence, Painful Urination, Urgency and Urinary Retention. Musculoskeletal:Present- Joint Swelling, Joint Pain, Back Pain and Morning Stiffness. Not Present- Muscle Weakness, Muscle Pain and Spasms. Neurological:Not Present- Tremor, Dizziness, Blackout spells, Paralysis, Difficulty with balance and Weakness. Psychiatric:Not Present- Insomnia.   Vitals Weight: 192 lb Height: 78 in Body Surface Area: 2.19 m Body Mass Index: 22.19 kg/m Pulse: 84 (Regular) Resp.: 14 (Unlabored) BP: 114/64 (Sitting, Left Arm, Standard)    Physical Exam The physical exam findings are as follows:  Note: Patient is a 61 year old male with continued hip pain. Patient is accompanied today by his wife today.   General Mental Status - Alert, cooperative and good historian. General Appearance- pleasant. Not in acute distress. Orientation- Oriented X3. Build & Nutrition- Well nourished and Well developed.   Head and Neck Head- normocephalic, atraumatic . Neck Global Assessment- supple. no bruit auscultated on the right and no bruit auscultated on the left.   Eye Pupil- Bilateral- Regular and Round. Motion- Bilateral- EOMI.   Chest and Lung Exam Auscultation: Breath sounds:- clear at anterior chest wall and - clear at posterior chest wall. Adventitious sounds:- No Adventitious sounds.   Cardiovascular Auscultation:Rhythm- Regular rate and rhythm. Heart Sounds- S1 WNL and S2 WNL. Murmurs & Other Heart Sounds:Auscultation of the heart reveals - No Murmurs.   Abdomen Palpation/Percussion:Tenderness- Abdomen is non-tender to palpation. Rigidity (guarding)- Abdomen is soft. Auscultation:Auscultation of the abdomen  reveals - Bowel sounds normal.   Male Genitourinary Not done, not pertinent to present illness  Musculoskeletal He is alert and oriented in no apparent distress. His right hip has normal motion. No discomfort. Left hip flexion is 90. No internal rotation, about 20 external rotation and 20 abduction. His gait pattern is significantly antalgic. He has a foot drop on the left side from previous back injury.  Assessment & Plan Osteoarthritis, Hip (715.35) Impression: Left Hip  Note: Patinet is for a left total hip replacement by Dr. Aluisio.  Plan is to go home.  PCP - Imran Haque - Patient has been seen preoperatively and felt to be stable for surgery.  Signed electronically by DREW L Barett Whidbee, PA-C  

## 2012-06-11 NOTE — Op Note (Signed)
Pre-operative diagnosis- Osteoarthritis Left hip  Post-operative diagnosis- Osteoarthritis  Left hip  Procedure-  LeftTotal Hip Arthroplasty  Surgeon- Ethan Alexander. Bertina Guthridge, MD  Assistant- Leilani Able, PA-C   Anesthesia  General  EBL- 300   Drain Hemovac   Complication- None  Condition-PACU - hemodynamically stable.   Brief Clinical Note- Ethan Alexander is a 61 y.o. male with end stage arthritis of his left hip with progressively worsening pain and dysfunction. Pain occurs with activity and rest including pain at night. He has tried analgesics, protected weight bearing and rest without benefit. Pain is too severe to attempt physical therapy. Radiographs demonstrate bone on bone arthritis with subchondral cyst formation. He presents now for left THA.  Procedure in detail-   The patient is brought into the operating room and placed on the operating table. After successful administration of General  anesthesia, the patient is placed in the  Right lateral decubitus position with the  Left side up and held in place with the hip positioner. The lower extremity is isolated from the perineum with plastic drapes and time-out is performed by the surgical team. The lower extremity is then prepped and draped in the usual sterile fashion. A short posterolateral incision is made with a ten blade through the subcutaneous tissue to the level of the fascia lata which is incised in line with the skin incision. The sciatic nerve is palpated and protected and the short external rotators and capsule are isolated from the femur. The hip is then dislocated and the center of the femoral head is marked. A trial prosthesis is placed such that the trial head corresponds to the center of the patients' native femoral head. The resection level is marked on the femoral neck and the resection is made with an oscillating saw. The femoral head is removed and femoral retractors placed to gain access to the femoral canal.  The canal finder is passed into the femoral canal and the canal is thoroughly irrigated with sterile saline to remove the fatty contents. Axial reaming is performed to 17.5  mm, proximal reaming to 10F  and the sleeve machined to a large. A 22 F large trial sleeve is placed into the proximal femur.      The femur is then retracted anteriorly to gain acetabular exposure. Acetabular retractors are placed and the labrum and osteophytes are removed, Acetabular reaming is performed to 59  mm and a 60  mm Pinnacle acetabular shell is placed in anatomic position with excellent purchase. Additional dome screws  were placed. An apex hole eliminator is placed and the permanent 36 mm neutral + 4 Marathon liner is placed into the acetabular shell.      The trial femur is then placed into the femoral canal. The size is 22 x 17  stem with a 36 + 12  neck and a 36 + 6 head with the neck version 10 degrees beyond  the patients' native anteversion. The hip is reduced with excellent stability with full extension and full external rotation, 70 degrees flexion with 40 degrees adduction and 90 degrees internal rotation and 90 degrees of flexion with 70 degrees of internal rotation. The operative leg is placed on top of the non-operative leg and the leg lengths are found to be equal. The trials are then removed and the permanent implant of the same size is impacted into the femoral canal. The ceramic femoral head of the same size as the trial is placed and the hip is reduced with  the same stability parameters. The operative leg is again placed on top of the non-operative leg and the leg lengths are found to be equal.      The wound is then copiously irrigated with saline solution and the capsule and short external rotators are re-attached to the femur through drill holes with Ethibond suture. The fascia lata is closed over a hemovac drain with #1 vicryl suture and the fascia lata, gluteal muscles and subcutaneous tissues are injected  with Exparel 20ml diluted with saline 50ml. The subcutaneous tissues are closed with #1 and2-0 vicryl and the subcuticular layer closed with running 4-0 Monocryl. The drain is hooked to suction, incision cleaned and dried, and steri-srips and a bulky sterile dressing applied. The limb is placed into a knee immobilizer and the patient is awakened and transported to recovery in stable condition.      Please note that a surgical assistant was a medical necessity for this procedure in order to perform it in a safe and expeditious manner. The assistant was necessary to provide retraction to the vital neurovascular structures and to retract and position the limb to allow for anatomic placement of the prosthetic components.  Ethan Alexander Ethan Fedorchak, MD    06/11/2012, 1:53 PM

## 2012-06-11 NOTE — Anesthesia Procedure Notes (Signed)
Procedure Name: Intubation Date/Time: 06/11/2012 12:44 PM Performed by: Hulan Fess Pre-anesthesia Checklist: Patient identified, Emergency Drugs available, Suction available, Patient being monitored and Timeout performed Patient Re-evaluated:Patient Re-evaluated prior to inductionOxygen Delivery Method: Circle system utilized Preoxygenation: Pre-oxygenation with 100% oxygen Intubation Type: IV induction Ventilation: Mask ventilation without difficulty Laryngoscope Size: Mac and 4 Grade View: Grade II Tube type: Oral Number of attempts: 2 Placement Confirmation: ETT inserted through vocal cords under direct vision and positive ETCO2 Secured at: 23 cm Dental Injury: Teeth and Oropharynx as per pre-operative assessment

## 2012-06-11 NOTE — Interval H&P Note (Signed)
History and Physical Interval Note:  06/11/2012 12:31 PM  Ethan Alexander  has presented today for surgery, with the diagnosis of Osteoarthritis of the Left Hip  The various methods of treatment have been discussed with the patient and family. After consideration of risks, benefits and other options for treatment, the patient has consented to  Procedure(s) (LRB) with comments: TOTAL HIP ARTHROPLASTY (Left) as a surgical intervention .  The patient's history has been reviewed, patient examined, no change in status, stable for surgery.  I have reviewed the patient's chart and labs.  Questions were answered to the patient's satisfaction.     Loanne Drilling

## 2012-06-11 NOTE — Anesthesia Postprocedure Evaluation (Signed)
  Anesthesia Post-op Note  Patient: Ethan Alexander  Procedure(s) Performed: Procedure(s) (LRB): TOTAL HIP ARTHROPLASTY (Left)  Patient Location: PACU  Anesthesia Type: General  Level of Consciousness: awake and alert   Airway and Oxygen Therapy: Patient Spontanous Breathing  Post-op Pain: mild  Post-op Assessment: Post-op Vital signs reviewed, Patient's Cardiovascular Status Stable, Respiratory Function Stable, Patent Airway and No signs of Nausea or vomiting  Post-op Vital Signs: stable  Complications: No apparent anesthesia complications

## 2012-06-12 ENCOUNTER — Encounter (HOSPITAL_COMMUNITY): Payer: Self-pay | Admitting: Orthopedic Surgery

## 2012-06-12 LAB — GLUCOSE, CAPILLARY: Glucose-Capillary: 129 mg/dL — ABNORMAL HIGH (ref 70–99)

## 2012-06-12 LAB — CBC
Hemoglobin: 11 g/dL — ABNORMAL LOW (ref 13.0–17.0)
MCH: 29.6 pg (ref 26.0–34.0)
MCHC: 32.1 g/dL (ref 30.0–36.0)
MCV: 92.2 fL (ref 78.0–100.0)
Platelets: 362 10*3/uL (ref 150–400)
RBC: 3.72 MIL/uL — ABNORMAL LOW (ref 4.22–5.81)

## 2012-06-12 LAB — BASIC METABOLIC PANEL
CO2: 27 mEq/L (ref 19–32)
Calcium: 8.4 mg/dL (ref 8.4–10.5)
Creatinine, Ser: 0.7 mg/dL (ref 0.50–1.35)
GFR calc non Af Amer: >90 mL/min (ref >90)
Glucose, Bld: 145 mg/dL — ABNORMAL HIGH (ref 70–99)
Sodium: 135 mEq/L (ref 135–145)

## 2012-06-12 NOTE — Evaluation (Signed)
Occupational Therapy Evaluation Patient Details Name: Ethan Alexander MRN: [id number] DOB: 11/11/1950 Today's Date: 06/12/2012 Time: 2841-3244 OT Time Calculation (min): 38 min  OT Assessment / Plan / Recommendation Clinical Impression  Pt doing well POD 1 LTHR. Skilled OT indicated to maximize independence with BADLs to supervision level in prep for d/c home with prn A from family.    OT Assessment  Patient needs continued OT Services    Follow Up Recommendations  No OT follow up    Barriers to Discharge      Equipment Recommendations  Rolling walker with 5" wheels;3 in 1 bedside comode    Recommendations for Other Services    Frequency  Min 2X/week    Precautions / Restrictions Precautions Precautions: Posterior Hip Precaution Comments: verbally reviewed and demonstrated hip precautions Restrictions Weight Bearing Restrictions: No LLE Weight Bearing: Weight bearing as tolerated   Pertinent Vitals/Pain Pt stated pain became worse as session progressed. RN made aware and repositioned for comfort.    ADL  Grooming: Performed;Wash/dry hands;Min guard Where Assessed - Grooming: Unsupported standing Upper Body Bathing: Simulated;Set up Where Assessed - Upper Body Bathing: Unsupported sitting Lower Body Bathing: Simulated;Minimal assistance Where Assessed - Lower Body Bathing: Supported sit to stand Upper Body Dressing: Simulated;Set up Where Assessed - Upper Body Dressing: Unsupported sitting Lower Body Dressing: Simulated;Moderate assistance Where Assessed - Lower Body Dressing: Supported sit to Pharmacist, hospital: Performed;Minimal Dentist Method: Sit to Barista: Raised toilet seat with arms (or 3-in-1 over toilet) Toileting - Clothing Manipulation and Hygiene: Performed;Min guard Where Assessed - Glass blower/designer Manipulation and Hygiene: Sit to stand from 3-in-1 or toilet Equipment Used: Rolling walker;Gait  belt Transfers/Ambulation Related to ADLs: Pt ambulated to the bathroom with min A and VCs for safe manipulation of RW around the bathroom. Cues to keep walker close to body when standing at the sink. ADL Comments: Will educate in the use of AE. Pt/family to obtain hip kit.    OT Diagnosis: Generalized weakness  OT Problem List: Decreased activity tolerance;Pain;Decreased knowledge of use of DME or AE OT Treatment Interventions: Self-care/ADL training;Therapeutic activities;DME and/or AE instruction;Patient/family education   OT Goals Acute Rehab OT Goals OT Goal Formulation: With patient/family Time For Goal Achievement: 06/19/12 Potential to Achieve Goals: Good ADL Goals Pt Will Perform Grooming: with supervision;Standing at sink ADL Goal: Grooming - Progress: Goal set today Pt Will Perform Lower Body Bathing: with supervision;Sit to stand from chair;Sit to stand from bed;with adaptive equipment ADL Goal: Lower Body Bathing - Progress: Goal set today Pt Will Perform Lower Body Dressing: with supervision;Sit to stand from bed;Sit to stand from chair ADL Goal: Lower Body Dressing - Progress: Goal set today Pt Will Transfer to Toilet: with supervision;3-in-1;Raised toilet seat with arms;Ambulation;Maintaining hip precautions ADL Goal: Toilet Transfer - Progress: Goal set today Pt Will Perform Toileting - Clothing Manipulation: with supervision;Sitting on 3-in-1 or toilet;Standing ADL Goal: Toileting - Clothing Manipulation - Progress: Goal set today Pt Will Perform Toileting - Hygiene: with supervision;Sit to stand from 3-in-1/toilet ADL Goal: Toileting - Hygiene - Progress: Goal set today Pt Will Perform Tub/Shower Transfer: Shower transfer;with supervision;Maintaining hip precautions;Ambulation ADL Goal: Tub/Shower Transfer - Progress: Goal set today  Visit Information  Assistance Needed: +1 PT/OT Co-Evaluation/Treatment: Yes    Subjective Data  Subjective: This isnt as bad as I  thought it was going to be... Patient Stated Goal: Return home tomorrow.   Prior Functioning     Home Living Lives With:  Spouse Available Help at Discharge: Family Type of Home: House Home Access: Stairs to enter [employer] of Steps: 4 Entrance Stairs-Rails: Right Home Layout: Two level;Able to live on main level with bedroom/bathroom Bathroom Shower/Tub: Health visitor: Standard Home Adaptive Equipment: Grab bars in shower;Walker - rolling;Straight cane;Crutches Prior Function Level of Independence: Independent Able to Take Stairs?: Yes Driving: Yes Vocation: Full time employment Communication Communication: No difficulties Dominant Hand: Right         Vision/Perception     Cognition  Overall Cognitive Status: Appears within functional limits for tasks assessed/performed Arousal/Alertness: Awake/alert Orientation Level: Appears intact for tasks assessed Behavior During Session: [facility] for tasks performed    Extremity/Trunk Assessment Right Upper Extremity Assessment RUE ROM/Strength/Tone: [facility] for tasks assessed Left Upper Extremity Assessment LUE ROM/Strength/Tone: WFL for tasks assessed Right Lower Extremity Assessment RLE ROM/Strength/Tone: [facility] for tasks assessed Left Lower Extremity Assessment LLE ROM/Strength/Tone: Deficits LLE ROM/Strength/Tone Deficits: foot drop on L. hip flex 2/5, knee ext 3/5.  Trunk Assessment Trunk Assessment: Normal     Mobility Bed Mobility Bed Mobility: Supine to Sit Supine to Sit: 4: Min assist Details for Bed Mobility Assistance: Assist for L LE off bed. VCs safety, technique, handplacement.  Transfers Sit to Stand: 4: Min assist;From bed;From elevated surface;With upper extremity assist Stand to Sit: 4: Min assist;To chair/3-in-1;With armrests;With upper extremity assist Details for Transfer Assistance: VCs safety, technique, handplacement. Assist to rise, stabilize, control descent.       Shoulder Instructions     Exercise     Balance     End of Session OT - End of Session Equipment Utilized During Treatment: Gait belt Activity Tolerance: Patient tolerated treatment well Patient left: in chair;with call bell/phone within reach;with family/visitor present Nurse Communication: Patient requests pain meds  GO     Francenia Chimenti A OTR/L [phone] 06/12/2012, 2:15 PM

## 2012-06-12 NOTE — Care Management Note (Signed)
    Page 1 of 2   06/12/2012     11:45:41 AM   CARE MANAGEMENT NOTE 06/12/2012  Patient:  Ethan Alexander, Ethan Alexander   Account Number:  [id number]  Date Initiated:  06/12/2012  Documentation initiated by:  Lorenda Ishihara  Subjective/Objective Assessment:   61 yo male admitted s/p LeftTotal Hip Arthroplasty. PTA lived at home with spouse.     Action/Plan:   Assist with arranging HH services.   Anticipated DC Date:  06/13/2012   Anticipated DC Plan:  HOME W HOME HEALTH SERVICES      DC Planning Services  CM consult      PAC Choice  DURABLE MEDICAL EQUIPMENT  HOME HEALTH   Choice offered to / List presented to:  C-1 Patient   DME arranged  3-N-1  Levan Hurst      DME agency  [facility]     [facility] arranged  HH-2 PT      Status of service:  Completed, signed off Medicare Important Message given?   (If response is "NO", the following Medicare IM given date fields will be blank) Date Medicare IM given:   Date Additional Medicare IM given:    Discharge Disposition:  HOME W HOME HEALTH SERVICES  Per UR Regulation:  Reviewed for med. necessity/level of care/duration of stay  If discussed at Long Length of Stay Meetings, dates discussed:    Comments:  06-12-12 Lorenda Ishihara RN CM [id number] PT evaluations recommend HHPT, patient prearranged with [facility]. Contacted Debbie with [facility], she is following patient for d/c needs, she is arranging DME.

## 2012-06-12 NOTE — Evaluation (Signed)
Physical Therapy Evaluation Patient Details Name: Ethan Alexander MRN: [id number] DOB: Jul 15, 1951 Today's Date: 06/12/2012 Time: 4098-1191 PT Time Calculation (min): 38 min  PT Assessment / Plan / Recommendation Clinical Impression  61 yo male s/p L THA. Mobilizing fairly well. Has L AFO and modified shoes that he prefers to ambulate in. Plan is for home with family assist. Recommend HHPT,RW.     PT Assessment  Patient needs continued PT services    Follow Up Recommendations  Home health PT    Does the patient have the potential to tolerate intense rehabilitation      Barriers to Discharge        Equipment Recommendations  Rolling walker with 5" wheels;3 in 1 bedside comode    Recommendations for Other Services OT consult   Frequency 7X/week    Precautions / Restrictions Precautions Precautions: Posterior Hip Precaution Comments: verbally reviewed and demonstrated hip precautions Restrictions Weight Bearing Restrictions: No LLE Weight Bearing: Weight bearing as tolerated Other Position/Activity Restrictions: WBAT   Pertinent Vitals/Pain 5/10 L hip      Mobility  Bed Mobility Bed Mobility: Supine to Sit Supine to Sit: 4: Min assist Details for Bed Mobility Assistance: Assist for L LE off bed. VCs safety, technique, handplacement.  Transfers Transfers: Sit to Stand;Stand to Sit Sit to Stand: 4: Min assist;From bed;From elevated surface;With upper extremity assist Stand to Sit: 4: Min assist;To chair/3-in-1;With armrests;With upper extremity assist Details for Transfer Assistance: VCs safety, technique, handplacement. Assist to rise, stabilize, control descent.  Ambulation/Gait Ambulation/Gait Assistance: 4: Min assist Ambulation Distance (Feet): 80 Feet Assistive device: Rolling walker Ambulation/Gait Assistance Details: VCs safety, technique, sequence. Assist to stabilize throughout ambulation.  Gait Pattern: Step-to pattern;Decreased stride length      Shoulder Instructions     Exercises     PT Diagnosis: Difficulty walking;Abnormality of gait;Generalized weakness;Acute pain  PT Problem List: Decreased strength;Decreased range of motion;Decreased mobility;Decreased activity tolerance;Pain;Decreased knowledge of precautions;Decreased knowledge of use of DME PT Treatment Interventions: DME instruction;Gait training;Stair training;Functional mobility training;Therapeutic activities;Therapeutic exercise;Patient/family education   PT Goals Acute Rehab PT Goals PT Goal Formulation: With patient Time For Goal Achievement: 06/26/12 Potential to Achieve Goals: Good Pt will go Supine/Side to Sit: with supervision PT Goal: Supine/Side to Sit - Progress: Goal set today Pt will go Sit to Supine/Side: with supervision PT Goal: Sit to Supine/Side - Progress: Goal set today Pt will go Sit to Stand: with supervision PT Goal: Sit to Stand - Progress: Goal set today Pt will Ambulate: 51 - 150 feet;with supervision;with rolling walker PT Goal: Ambulate - Progress: Goal set today Pt will Go Up / Down Stairs: 3-5 stairs;with min assist;with least restrictive assistive device;with rail(s) PT Goal: Up/Down Stairs - Progress: Goal set today  Visit Information  Last PT Received On: 06/12/12    Subjective Data  Subjective: "This was better than dangling last night" Patient Stated Goal: Home Friday   Prior Functioning  Home Living Lives With: Spouse Available Help at Discharge: Family Type of Home: House Home Access: Stairs to enter [employer] of Steps: 4 Entrance Stairs-Rails: Right Home Layout: Two level;Able to live on main level with bedroom/bathroom Bathroom Shower/Tub: Health visitor: Standard Home Adaptive Equipment: Grab bars in shower;Walker - rolling;Straight cane;Crutches Prior Function Level of Independence: Independent Able to Take Stairs?: Yes Driving: Yes Vocation: Full time  employment Communication Communication: No difficulties Dominant Hand: Right    Cognition  Overall Cognitive Status: Appears within functional limits for tasks assessed/performed Arousal/Alertness:  Awake/Alexander Orientation Level: Appears intact for tasks assessed Behavior During Session: [facility] for tasks performed    Extremity/Trunk Assessment Right Lower Extremity Assessment RLE ROM/Strength/Tone: [facility] for tasks assessed Left Lower Extremity Assessment LLE ROM/Strength/Tone: Deficits LLE ROM/Strength/Tone Deficits: foot drop on L. hip flex 2/5, knee ext 3/5.  Trunk Assessment Trunk Assessment: Normal   Balance    End of Session PT - End of Session Equipment Utilized During Treatment: Gait belt;Left ankle foot orthosis Activity Tolerance: Patient tolerated treatment well Patient left: in chair;with call bell/phone within reach;with family/visitor present  GP     Ethan Alexander [facility] 06/12/2012, 11:37 AM [phone]

## 2012-06-12 NOTE — Progress Notes (Signed)
Called report to 6th floor, patient with stable vital signs this shift, dressing is clean dry and intact, ultram 100 mg given prior to transfer to floor Means, Donye Dauenhauer N Rn 17:15pm

## 2012-06-12 NOTE — Progress Notes (Addendum)
   Subjective: 1 Day Post-Op Procedure(s) (LRB): TOTAL HIP ARTHROPLASTY (Left) Patient reports pain as mild.   Patient seen in rounds with Dr. Lequita Halt. Patient is well, and has had no acute complaints or problems We will start therapy today.  Plan is to go Home after hospital stay.  Objective: Vital signs in last 24 hours: Temp:  [96.9 F (36.1 C)-98.5 F (36.9 C)] 97.5 F (36.4 C) (10/10 1006) Pulse Rate:  [70-88] 77  (10/10 1006) Resp:  [10-20] 20  (10/10 1006) BP: (118-176)/(66-96) 124/66 mmHg (10/10 1006) SpO2:  [97 %-100 %] 99 % (10/10 1006) Weight:  [87.091 kg (192 lb)] 87.091 kg (192 lb) (10/09 1533)  Intake/Output from previous day:  Intake/Output Summary (Last 24 hours) at 06/12/12 1133 Last data filed at 06/12/12 1006  Gross per 24 hour  Intake 4646.25 ml  Output   3015 ml  Net 1631.25 ml    Intake/Output this shift: Total I/O In: 240 [P.O.:240] Out: 875 [Urine:875]  Labs:  [facility] 06/12/12 0350  HGB 11.0*    Basename 06/12/12 0350  WBC 8.5  RBC 3.72*  HCT 34.3*  PLT 362    Basename 06/12/12 0350  NA 135  K 4.3  CL 102  CO2 27  BUN 10  CREATININE 0.70  GLUCOSE 145*  CALCIUM 8.4   No results found for this basename: LABPT:2,INR:2 in the last 72 hours  EXAM General - Patient is Alert, Appropriate and Oriented Extremity - Neurovascular intact Sensation intact distally Dorsiflexion/Plantar flexion intact Dressing - dressing C/D/I Motor Function - He has a foot drop on the left side from previous back injury. Hemovac pulled without difficulty.  Past Medical History  Diagnosis Date  . Hypertension   . Diabetes mellitus   . Arthritis   . Ruptured lumbar disc     L5-S1   x 2 disc  . Kidney stones   . Foot drop, left   . Osteomyelitis 2010    left foot  . Osteomyelitis 2008/2010    left  foot- metatarsal- per patient was MRSA    Assessment/Plan: 1 Day Post-Op Procedure(s) (LRB): TOTAL HIP ARTHROPLASTY (Left) Principal  Problem:  *OA (osteoarthritis) of hip  Estimated Body mass index is 22.19 kg/(m^2) as calculated from the following:   Height as of this encounter: 6\' 6" (1.981 m).   Weight as of this encounter: 192 lb(87.091 kg). Advance diet Up with therapy Plan for discharge tomorrow Discharge home with home health  DVT Prophylaxis - Xarelto Weight Bearing As Tolerated left Leg D/C Knee Immobilizer Hemovac Pulled Begin Therapy Hip Preacutions No vaccines. Started steroid taper postop  PERKINS, ALEXZANDREW 06/12/2012, 11:33 AM

## 2012-06-12 NOTE — Progress Notes (Signed)
Physical Therapy Treatment Patient Details Name: Ethan Alexander MRN: [id number] DOB: Mar 26, 1951 Today's Date: 06/12/2012 Time: 4098-1191 PT Time Calculation (min): 37 min  PT Assessment / Plan / Recommendation Comments on Treatment Session  Mobilizing well. Plan is for home Fri afternoon.     Follow Up Recommendations  Home health PT     Does the patient have the potential to tolerate intense rehabilitation     Barriers to Discharge        Equipment Recommendations  Rolling walker with 5" wheels;3 in 1 bedside comode    Recommendations for Other Services OT consult  Frequency 7X/week   Plan Discharge plan remains appropriate    Precautions / Restrictions Precautions Precautions: Posterior Hip Restrictions Weight Bearing Restrictions: No LLE Weight Bearing: Weight bearing as tolerated   Pertinent Vitals/Pain 4/10 L hip    Mobility  Bed Mobility Bed Mobility: Sit to Supine Sit to Supine: 4: Min assist Details for Bed Mobility Assistance: Assist for L LE onto bed. VCs safety, technique, hand placement.  Transfers Transfers: Sit to Stand;Stand to Sit Sit to Stand: 4: Min assist;From chair/3-in-1 Stand to Sit: 4: Min assist;To bed;To elevated surface Details for Transfer Assistance: VCs safety, technique, hand placement. Assist to rise, stabilize, control descent.  Ambulation/Gait Ambulation/Gait Assistance: 4: Min guard Ambulation Distance (Feet): 135 Feet Assistive device: Rolling walker Ambulation/Gait Assistance Details: VCs safety, technique, sequence. Gait Pattern: Step-to pattern;Decreased stride length    Exercises Total Joint Exercises Ankle Circles/Pumps: AROM;Both;10 reps;Supine Quad Sets: AROM;Both;10 reps;Supine Short Arc Quad: AROM;Left;10 reps;Supine Heel Slides: AAROM;10 reps;Left;Supine Hip ABduction/ADduction: AAROM;Left;10 reps;Supine   PT Diagnosis:    PT Problem List:   PT Treatment Interventions:     PT Goals Acute Rehab PT Goals Pt  will go Sit to Supine/Side: with supervision PT Goal: Sit to Supine/Side - Progress: Progressing toward goal Pt will go Sit to Stand: with supervision PT Goal: Sit to Stand - Progress: Progressing toward goal Pt will Ambulate: 51 - 150 feet;with supervision;with rolling walker PT Goal: Ambulate - Progress: Progressing toward goal  Visit Information  Last PT Received On: 06/12/12 Assistance Needed: +1    Subjective Data  Subjective: "It wasn't too bad today" Patient Stated Goal: HOme friday   Cognition  Overall Cognitive Status: Appears within functional limits for tasks assessed/performed Arousal/Alertness: Awake/alert Orientation Level: Appears intact for tasks assessed Behavior During Session: [facility] for tasks performed    Balance     End of Session PT - End of Session Equipment Utilized During Treatment: Gait belt;Left ankle foot orthosis Activity Tolerance: Patient tolerated treatment well Patient left: in bed;with call bell/phone within reach   GP     Ethan Alexander 06/12/2012, 4:06 PM [phone]

## 2012-06-13 LAB — CBC
HCT: 31.5 % — ABNORMAL LOW (ref 39.0–52.0)
Hemoglobin: 10.2 g/dL — ABNORMAL LOW (ref 13.0–17.0)
MCH: 29.8 pg (ref 26.0–34.0)
MCV: 92.1 fL (ref 78.0–100.0)
RBC: 3.42 MIL/uL — ABNORMAL LOW (ref 4.22–5.81)
WBC: 7.5 10*3/uL (ref 4.0–10.5)

## 2012-06-13 LAB — BASIC METABOLIC PANEL
BUN: 9 mg/dL (ref 6–23)
CO2: 28 mEq/L (ref 19–32)
Calcium: 8.5 mg/dL (ref 8.4–10.5)
Chloride: 102 mEq/L (ref 96–112)
Creatinine, Ser: 0.69 mg/dL (ref 0.50–1.35)
Glucose, Bld: 103 mg/dL — ABNORMAL HIGH (ref 70–99)

## 2012-06-13 MED ORDER — PREDNISONE 5 MG PO TABS: 5.0000 mg | ORAL_TABLET | ORAL | Status: DC

## 2012-06-13 MED ORDER — OXYCODONE HCL 5 MG PO TABS: 5.0000 mg | ORAL_TABLET | ORAL | Status: DC | PRN

## 2012-06-13 MED ORDER — RIVAROXABAN 10 MG PO TABS: 10.0000 mg | ORAL_TABLET | Freq: Every day | ORAL | Status: DC

## 2012-06-13 MED ORDER — METHOCARBAMOL 500 MG PO TABS: 500.0000 mg | ORAL_TABLET | Freq: Four times a day (QID) | ORAL | Status: DC | PRN

## 2012-06-13 NOTE — Progress Notes (Signed)
Physical Therapy Treatment Patient Details Name: Ethan Alexander MRN: [id number] DOB: 09-23-1950 Today's Date: 06/13/2012 Time: 1478-2956 PT Time Calculation (min): 37 min  PT Assessment / Plan / Recommendation Comments on Treatment Session  POD #2 Posterior THR.  With spouse present, practiced negociating stairs, instructed on proper tranfers in/out car, HEP and TH percautions.    Follow Up Recommendations  Home health PT     Does the patient have the potential to tolerate intense rehabilitation     Barriers to Discharge        Equipment Recommendations       Recommendations for Other Services    Frequency 7X/week   Plan Discharge plan remains appropriate    Precautions / Restrictions Precautions Precautions: Posterior Hip Precaution Comments: Able to recall 3/3 hip precautions.  Wear L AFO for foot drop. Restrictions Weight Bearing Restrictions: No LLE Weight Bearing: Weight bearing as tolerated    Pertinent Vitals/Pain "soreness" "tightness"    Mobility  Bed Mobility Bed Mobility: Not assessed Details for Bed Mobility Assistance: Pt sitting EOB on arrival with spouse applying shoes.  Transfers Transfers: Sit to Stand;Stand to Sit Sit to Stand: 5: Supervision;From chair/3-in-1 Stand to Sit: 5: Supervision;To chair/3-in-1 Details for Transfer Assistance: <25% VC's to avoid hip flex > 90' by advancing L LE and keeping his "nose in the air".  Ambulation/Gait Ambulation/Gait Assistance: 5: Supervision Ambulation Distance (Feet): 225 Feet Assistive device: Rolling walker Ambulation/Gait Assistance Details: With spouse and instruction on safe handling.  <25% VC's on safety with turns and backward agit sequencing. Gait Pattern: Step-to pattern;Decreased stride length;Decreased stance time - left  Stairs: Yes Stair Management Technique: One rail Left;With crutches (with spouse present for family education) Number of Stairs: 4      PT Goals progressing    Visit  Information  Last PT Received On: 06/13/12 Assistance Needed: +1                   End of Session PT - End of Session Equipment Utilized During Treatment: Gait belt Activity Tolerance: Patient tolerated treatment well Patient left: in chair;with call bell/phone within reach;with family/visitor present Nurse Communication: Other (comment) (pt ready for D/C to home)   Felecia Shelling  PTA [facility]  Acute  Rehab Pager     [phone]

## 2012-06-13 NOTE — Progress Notes (Signed)
   Subjective: 2 Days Post-Op Procedure(s) (LRB): TOTAL HIP ARTHROPLASTY (Left) Patient reports pain as mild.   Patient seen in rounds with Dr. Lequita Halt.  He is ready to go home today. Patient is well, and has had no acute complaints or problems Patient is ready to go home.  Objective: Vital signs in last 24 hours: Temp:  [97.5 F (36.4 C)-98.3 F (36.8 C)] 98.1 F (36.7 C) (10/11 0623) Pulse Rate:  [76-91] 76  (10/11 0623) Resp:  [20] 20  (10/11 0623) BP: (109-124)/(61-77) 124/74 mmHg (10/11 0623) SpO2:  [95 %-99 %] 95 % (10/11 0623)  Intake/Output from previous day:  Intake/Output Summary (Last 24 hours) at 06/13/12 0834 Last data filed at 06/13/12 7829  Gross per 24 hour  Intake    720 ml  Output   2226 ml  Net  -1506 ml    Intake/Output this shift:    Labs:  Basename 06/13/12 0350 06/12/12 0350  HGB 10.2* 11.0*    Basename 06/13/12 0350 06/12/12 0350  WBC 7.5 8.5  RBC 3.42* 3.72*  HCT 31.5* 34.3*  PLT 345 362    Basename 06/13/12 0350 06/12/12 0350  NA 138 135  K 3.8 4.3  CL 102 102  CO2 28 27  BUN 9 10  CREATININE 0.69 0.70  GLUCOSE 103* 145*  CALCIUM 8.5 8.4   No results found for this basename: LABPT:2,INR:2 in the last 72 hours  EXAM: General - Patient is Alert, Appropriate and Oriented Extremity - Intact pulses distally No cellulitis present Incision - clean, dry, no drainage, healing Motor Function - He has a foot drop on the left side from previous back injury.  Assessment/Plan: 2 Days Post-Op Procedure(s) (LRB): TOTAL HIP ARTHROPLASTY (Left) Procedure(s) (LRB): TOTAL HIP ARTHROPLASTY (Left) Past Medical History  Diagnosis Date  . Hypertension   . Diabetes mellitus   . Arthritis   . Ruptured lumbar disc     L5-S1   x 2 disc  . Kidney stones   . Foot drop, left   . Osteomyelitis 2010    left foot  . Osteomyelitis 2008/2010    left  foot- metatarsal- per patient was MRSA   Principal Problem:  *OA (osteoarthritis) of  hip  Estimated Body mass index is 22.19 kg/(m^2) as calculated from the following:   Height as of this encounter: 6\' 6" (1.981 m).   Weight as of this encounter: 192 lb(87.091 kg). Discharge home with home health Diet - Cardiac diet and Diabetic diet Follow up - in 2 weeks Activity - WBAT Disposition - Home Condition Upon Discharge - Good D/C Meds - See DC Summary DVT Prophylaxis - Xarelto  Akisha Sturgill 06/13/2012, 8:34 AM

## 2012-06-13 NOTE — Progress Notes (Signed)
Occupational Therapy Treatment Patient Details Name: Ethan Alexander MRN: [id number] DOB: 05/02/1951 Today's Date: 06/13/2012 Time: 4098-1191 OT Time Calculation (min): 38 min  OT Assessment / Plan / Recommendation Comments on Treatment Session Pt progressing well. D/C planned for today.    Follow Up Recommendations  No OT follow up    Barriers to Discharge       Equipment Recommendations  Other (comment) (Equip already delivered)    Recommendations for Other Services    Frequency Min 2X/week   Plan Discharge plan remains appropriate    Precautions / Restrictions Precautions Precautions: Posterior Hip Precaution Comments: Able to recall 3/3 hip precautions. Restrictions Weight Bearing Restrictions: No LLE Weight Bearing: Weight bearing as tolerated   Pertinent Vitals/Pain Reported some mild pain with mobility that pt did not rate. Repositioned for comfort.    ADL  Lower Body Dressing: Performed;Minimal assistance Where Assessed - Lower Body Dressing: Supported sit to stand Tub/Shower Transfer: Performed;Min guard Tub/Shower Transfer Method: Ambulating Equipment Used: Rolling walker ADL Comments: Educated pt how to safely step into shower with good return demo. Also educated pt how to utilize AE for LB ADLs with good return demo. Wife was educated how to don pt's AFO and TED hose.    OT Diagnosis:    OT Problem List:   OT Treatment Interventions:     OT Goals ADL Goals ADL Goal: Lower Body Dressing - Progress: Progressing toward goals ADL Goal: Tub/Shower Transfer - Progress: Progressing toward goals  Visit Information  Last OT Received On: 06/13/12 Assistance Needed: +1    Subjective Data  Subjective: I don't know how in the world to get these hose on....   Prior Functioning       Cognition  Overall Cognitive Status: Appears within functional limits for tasks assessed/performed Arousal/Alertness: Awake/alert Orientation Level: Appears intact for tasks  assessed Behavior During Session: [facility] for tasks performed    Mobility  Shoulder Instructions Transfers Sit to Stand: 4: Min guard;With upper extremity assist;From bed Stand to Sit: 4: Min guard;With upper extremity assist;To bed Details for Transfer Assistance: Min VCs for hand placement and maintaining THP during transfers.       Exercises      Balance     End of Session OT - End of Session Activity Tolerance: Patient tolerated treatment well Patient left: in bed;with call bell/phone within reach  GO     Roshad Hack A OTR/L [phone] 06/13/2012, 11:51 AM

## 2012-06-13 NOTE — Progress Notes (Signed)
Patient iv lock removed, area without redness. Patient given discharge instructions and prescriptions. Had been issued equipment from PT. No complaints of pain at this time. Patient verbalized understanding of discharge instructions. Left unit in wheelchair accompanied by staff.

## 2012-06-14 NOTE — Progress Notes (Signed)
Discharge summary sent to payer through [facility]  

## 2012-06-26 DIAGNOSIS — D62 Acute posthemorrhagic anemia: Secondary | ICD-10-CM

## 2012-06-26 NOTE — Discharge Summary (Signed)
Physician Discharge Summary   Patient ID: Ethan Alexander MRN: [id number] DOB/AGE: 59/21/1952 59 y.o.  Admit date: 06/11/2012 Discharge date: 06/13/12  Primary Diagnosis: Osteoarthritis Left hip   Admission Diagnoses:  Past Medical History  Diagnosis Date  . Hypertension   . Diabetes mellitus   . Arthritis   . Ruptured lumbar disc     L5-S1   x 2 disc  . Kidney stones   . Foot drop, left   . Osteomyelitis 2010    left foot  . Osteomyelitis 2008/2010    left  foot- metatarsal- per patient was MRSA   Discharge Diagnoses:   Principal Problem:  *OA (osteoarthritis) of hip Active Problems:  Postop Acute blood loss anemia  Estimated Body mass index is 22.19 kg/(m^2) as calculated from the following:   Height as of this encounter: 6\' 6" (1.981 m).   Weight as of this encounter: 192 lb(87.091 kg).  Classification of overweight in adults according to BMI (WHO, 1998)   Procedure: Procedure(s) (LRB): TOTAL HIP ARTHROPLASTY (Left)   Consults: None  HPI: GRASON BRAILSFORD is a 61 y.o. male with end stage arthritis of his left hip with progressively worsening pain and dysfunction. Pain occurs with activity and rest including pain at night. He has tried analgesics, protected weight bearing and rest without benefit. Pain is too severe to attempt physical therapy. Radiographs demonstrate bone on bone arthritis with subchondral cyst formation. He presents now for left THA.  Laboratory Data: Admission on 06/11/2012, Discharged on 06/13/2012  Component Date Value Range Status  . ABO/RH(D) 06/11/2012 O POS   Final  . Antibody Screen 06/11/2012 NEG   Final  . Sample Expiration 06/11/2012 06/14/2012   Final  . ABO/RH(D) 06/11/2012 O POS   Final  . Glucose-Capillary 06/11/2012 70  70 - 99 mg/dL Final  . Glucose-Capillary 06/11/2012 97  70 - 99 mg/dL Final  . Comment 1 56/21/3086 Documented in Chart   Final  . Comment 2 06/11/2012 Notify RN   Final  . Glucose-Capillary 06/11/2012 148*  70 - 99 mg/dL Final  . WBC 57/84/6962 8.5  4.0 - 10.5 K/uL Final  . RBC 06/12/2012 3.72* 4.22 - 5.81 MIL/uL Final  . Hemoglobin 06/12/2012 11.0* 13.0 - 17.0 g/dL Final  . HCT 95/28/4132 34.3* 39.0 - 52.0 % Final  . MCV 06/12/2012 92.2  78.0 - 100.0 fL Final  . MCH 06/12/2012 29.6  26.0 - 34.0 pg Final  . MCHC 06/12/2012 32.1  30.0 - 36.0 g/dL Final  . RDW 44/09/270 14.5  11.5 - 15.5 % Final  . Platelets 06/12/2012 362  150 - 400 K/uL Final  . Sodium 06/12/2012 135  135 - 145 mEq/L Final  . Potassium 06/12/2012 4.3  3.5 - 5.1 mEq/L Final  . Chloride 06/12/2012 102  96 - 112 mEq/L Final  . CO2 06/12/2012 27  19 - 32 mEq/L Final  . Glucose, Bld 06/12/2012 145* 70 - 99 mg/dL Final  . BUN 53/66/4403 10  6 - 23 mg/dL Final  . Creatinine, Ser 06/12/2012 0.70  0.50 - 1.35 mg/dL Final  . Calcium 47/42/5956 8.4  8.4 - 10.5 mg/dL Final  . GFR calc non Af Amer 06/12/2012 >90  >90 mL/min Final  . GFR calc Af Amer 06/12/2012 >90  >90 mL/min Final   Comment:  The eGFR has been calculated                          using the CKD EPI equation.                          This calculation has not been                          validated in all clinical                          situations.                          eGFR's persistently                          <90 mL/min signify                          possible Chronic Kidney Disease.  . Glucose-Capillary 06/11/2012 150* 70 - 99 mg/dL Final  . Comment 1 16/06/9603 Notify RN   Final  . Glucose-Capillary 06/12/2012 143* 70 - 99 mg/dL Final  . Glucose-Capillary 06/12/2012 114* 70 - 99 mg/dL Final  . WBC 54/05/8118 7.5  4.0 - 10.5 K/uL Final  . RBC 06/13/2012 3.42* 4.22 - 5.81 MIL/uL Final  . Hemoglobin 06/13/2012 10.2* 13.0 - 17.0 g/dL Final  . HCT 14/78/2956 31.5* 39.0 - 52.0 % Final  . MCV 06/13/2012 92.1  78.0 - 100.0 fL Final  . MCH 06/13/2012 29.8  26.0 - 34.0 pg Final  . MCHC 06/13/2012 32.4  30.0 - 36.0 g/dL Final  .  RDW 21/30/8657 14.8  11.5 - 15.5 % Final  . Platelets 06/13/2012 345  150 - 400 K/uL Final  . Sodium 06/13/2012 138  135 - 145 mEq/L Final  . Potassium 06/13/2012 3.8  3.5 - 5.1 mEq/L Final  . Chloride 06/13/2012 102  96 - 112 mEq/L Final  . CO2 06/13/2012 28  19 - 32 mEq/L Final  . Glucose, Bld 06/13/2012 103* 70 - 99 mg/dL Final  . BUN 84/69/6295 9  6 - 23 mg/dL Final  . Creatinine, Ser 06/13/2012 0.69  0.50 - 1.35 mg/dL Final  . Calcium 28/41/3244 8.5  8.4 - 10.5 mg/dL Final  . GFR calc non Af Amer 06/13/2012 >90  >90 mL/min Final  . GFR calc Af Amer 06/13/2012 >90  >90 mL/min Final   Comment:                                 The eGFR has been calculated                          using the CKD EPI equation.                          This calculation has not been                          validated in all clinical  situations.                          eGFR's persistently                          <90 mL/min signify                          possible Chronic Kidney Disease.  . Glucose-Capillary 06/12/2012 129* 70 - 99 mg/dL Final  . Glucose-Capillary 06/12/2012 196* 70 - 99 mg/dL Final  . Glucose-Capillary 06/13/2012 84  70 - 99 mg/dL Final  . Comment 1 16/06/9603 Notify RN   Final  . Comment 2 06/13/2012 Documented in Chart   Final  Hospital Outpatient Visit on 06/05/2012  Component Date Value Range Status  . aPTT 06/05/2012 32  24 - 37 seconds Final  . WBC 06/05/2012 7.3  4.0 - 10.5 K/uL Final  . RBC 06/05/2012 4.23  4.22 - 5.81 MIL/uL Final  . Hemoglobin 06/05/2012 12.7* 13.0 - 17.0 g/dL Final  . HCT 54/05/8118 39.3  39.0 - 52.0 % Final  . MCV 06/05/2012 92.9  78.0 - 100.0 fL Final  . MCH 06/05/2012 30.0  26.0 - 34.0 pg Final  . MCHC 06/05/2012 32.3  30.0 - 36.0 g/dL Final  . RDW 14/78/2956 14.6  11.5 - 15.5 % Final  . Platelets 06/05/2012 392  150 - 400 K/uL Final  . Sodium 06/05/2012 139  135 - 145 mEq/L Final  . Potassium 06/05/2012 4.0  3.5 - 5.1 mEq/L  Final  . Chloride 06/05/2012 102  96 - 112 mEq/L Final  . CO2 06/05/2012 28  19 - 32 mEq/L Final  . Glucose, Bld 06/05/2012 76  70 - 99 mg/dL Final  . BUN 21/30/8657 14  6 - 23 mg/dL Final  . Creatinine, Ser 06/05/2012 0.74  0.50 - 1.35 mg/dL Final  . Calcium 84/69/6295 9.5  8.4 - 10.5 mg/dL Final  . Total Protein 06/05/2012 7.0  6.0 - 8.3 g/dL Final  . Albumin 28/41/3244 3.0* 3.5 - 5.2 g/dL Final  . AST 09/05/7251 15  0 - 37 U/L Final  . ALT 06/05/2012 15  0 - 53 U/L Final  . Alkaline Phosphatase 06/05/2012 70  39 - 117 U/L Final  . Total Bilirubin 06/05/2012 0.2* 0.3 - 1.2 mg/dL Final  . GFR calc non Af Amer 06/05/2012 >90  >90 mL/min Final  . GFR calc Af Amer 06/05/2012 >90  >90 mL/min Final   Comment:                                 The eGFR has been calculated                          using the CKD EPI equation.                          This calculation has not been                          validated in all clinical                          situations.  eGFR's persistently                          <90 mL/min signify                          possible Chronic Kidney Disease.  Marland Kitchen Prothrombin Time 06/05/2012 12.5  11.6 - 15.2 seconds Final  . INR 06/05/2012 0.94  0.00 - 1.49 Final  . Color, Urine 06/05/2012 YELLOW  YELLOW Final  . APPearance 06/05/2012 CLEAR  CLEAR Final  . Specific Gravity, Urine 06/05/2012 1.016  1.005 - 1.030 Final  . pH 06/05/2012 5.0  5.0 - 8.0 Final  . Glucose, UA 06/05/2012 NEGATIVE  NEGATIVE mg/dL Final  . Hgb urine dipstick 06/05/2012 NEGATIVE  NEGATIVE Final  . Bilirubin Urine 06/05/2012 NEGATIVE  NEGATIVE Final  . Ketones, ur 06/05/2012 NEGATIVE  NEGATIVE mg/dL Final  . Protein, ur 16/06/9603 NEGATIVE  NEGATIVE mg/dL Final  . Urobilinogen, UA 06/05/2012 0.2  0.0 - 1.0 mg/dL Final  . Nitrite 54/05/8118 NEGATIVE  NEGATIVE Final  . Leukocytes, UA 06/05/2012 NEGATIVE  NEGATIVE Final   MICROSCOPIC NOT DONE ON URINES WITH NEGATIVE  PROTEIN, BLOOD, LEUKOCYTES, NITRITE, OR GLUCOSE <1000 mg/dL.  Marland Kitchen MRSA, PCR 06/05/2012 NEGATIVE  NEGATIVE Final  . Staphylococcus aureus 06/05/2012 POSITIVE* NEGATIVE Final   Comment:                                 The Xpert SA Assay (FDA                          approved for NASAL specimens                          in patients over 78 years of age),                          is one component of                          a comprehensive surveillance                          program.  Test performance has                          been validated by [employer] for patients greater                          than or equal to 6 year old.                          It is not intended                          to diagnose infection nor to                          guide or monitor treatment.  X-Rays:Dg Chest 2 View  06/05/2012  *RADIOLOGY REPORT*  Clinical Data: Preop hip replacement  CHEST - 2 VIEW  Comparison: None.  Findings: Heart size and pulmonary vascularity are normal.  Lungs are clear without infiltrate or effusion.  No mass lesion.  IMPRESSION: No acute cardiopulmonary process.   Original Report Authenticated By: Camelia Phenes, M.D.    Dg Hip Complete Left  06/05/2012  *RADIOLOGY REPORT*  Clinical Data: Preop hip replacement  LEFT HIP - COMPLETE 2+ VIEW  Comparison: None.  Findings: Advanced degenerative change in the left hip with complete loss of joint space.  There is collapse of the femoral head with cystic changes surrounded  by sclerosis in the femoral head.  There is degenerative spurring around the hip joint.  This may be due to underlying AVN.  The right hip appears normal.  No focal bony lesion.  IMPRESSION: Advanced degenerative change in the left hip joint   Original Report Authenticated By: Camelia Phenes, M.D.    Dg Pelvis Portable  06/11/2012  *RADIOLOGY REPORT*  Clinical Data: Status post left hip arthroplasty.  PORTABLE PELVIS  Comparison:  12/19/2011.  Findings: Postoperative changes of left hip total arthroplasty are noted.  No obvious periprosthetic fracture around either the acetabular cup or the visualized portion of the femoral component. The prosthetic femoral head appears located within the acetabular cup.  A surgical drain is in place lateral to the hip joint.  Gas is present overlying soft tissues.  IMPRESSION: 1.  Expected postoperative changes of left total hip arthroplasty, as above, without immediate complicating features.   Original Report Authenticated By: Florencia Reasons, M.D.    Dg Hip Portable 1 View Left  06/11/2012  *RADIOLOGY REPORT*  Clinical Data: Postop hip replacement  PORTABLE LEFT HIP - 1 VIEW  Comparison: 06/05/2012  Findings: The patient has had interval left total hip replacement. Portable AP view at 1445 hours shows no immediate hardware complications.  Surgical drain overlies the lateral soft tissues.  IMPRESSION: No evidence for immediate hardware complications status post left hip replacement.   Original Report Authenticated By: ERIC A. MANSELL, M.D.     EKG: Orders placed during the hospital encounter of 06/11/12  . EKG 12-LEAD  . EKG 12-LEAD  . EKG     Hospital Course:  Patient was admitted to [facility] and taken to the OR and underwent the above state procedure without complications.  Patient tolerated the procedure well and was later transferred to the recovery room and then to the orthopaedic floor for postoperative care.  They were given PO and IV analgesics for pain control following their surgery.  They were given 24 hours of postoperative antibiotics of  Anti-infectives     Start     Dose/Rate Route Frequency Ordered Stop   06/11/12 1800   ceFAZolin (ANCEF) IVPB 1 g/50 mL premix        1 g 100 mL/hr over 30 Minutes Intravenous Every 6 hours 06/11/12 1532 06/12/12 0039   06/11/12 1040   ceFAZolin (ANCEF) IVPB 2 g/50 mL premix        2 g 100 mL/hr over 30 Minutes  Intravenous 60 min pre-op 06/11/12 1040 06/11/12 1244         and started on DVT prophylaxis in the form of Xarelto.   PT and OT were ordered for total hip protocol.  The patient was allowed to be WBAT with therapy. Discharge planning was consulted to help with postop disposition and equipment  needs.  Patient had a decent night on the evening of surgery and started to get up OOB with therapy on day one and walked 80 feet.  Hemovac drain was pulled without difficulty.  The knee immobilizer was removed and discontinued.  Continued to work with therapy into day two adm walked over 200 feet.  Dressing was changed on day two and the incision was healing well.  Patient was seen in rounds and was ready to go home later that afternoon on day two.   Discharge Medications: Prior to Admission medications   Medication Sig Start Date End Date Taking? Authorizing Provider  metFORMIN (GLUCOPHAGE) 500 MG tablet Take 500 mg by mouth 2 (two) times daily with a meal.   Yes Historical Provider, MD  ramipril (ALTACE) 5 MG tablet Take 5 mg by mouth daily before breakfast.   Yes Historical Provider, MD  leflunomide (ARAVA) 20 MG tablet Take 20 mg by mouth daily before breakfast.    Historical Provider, MD  methocarbamol (ROBAXIN) 500 MG tablet Take 1 tablet (500 mg total) by mouth every 6 (six) hours as needed. 06/13/12   Alexzandrew Perkins, PA  oxyCODONE (OXY IR/ROXICODONE) 5 MG immediate release tablet Take 1-2 tablets (5-10 mg total) by mouth every 3 (three) hours as needed. 06/13/12   Alexzandrew Julien Girt, PA  predniSONE (DELTASONE) 5 MG tablet Take 1 tablet (5 mg total) by mouth as directed. 3 tabs (15mg )  this evening 06/13/12.  2 tabs (10mg )  twice a day on 06/14/12.  Then resume the 9 mg dosing daily starting 06/16/11 and take the 5 mg tablet with the 4 mg to equal the 9 mg dosing. 06/13/12   Alexzandrew Julien Girt, PA  rivaroxaban (XARELTO) 10 MG TABS tablet Take 1 tablet (10 mg total) by mouth daily with breakfast.  Take Xarelto for two and a half more weeks, then discontinue Xarelto. 06/13/12   Alexzandrew Julien Girt, PA    Diet: Cardiac diet and Diabetic diet Activity:WBAT No bending hip over 90 degrees- A "L" Angle Do not cross legs Do not let foot roll inward When turning these patients a pillow should be placed between the patient's legs to prevent crossing. Patients should have the affected knee fully extended when trying to sit or stand from all surfaces to prevent excessive hip flexion. When ambulating and turning toward the affected side the affected leg should have the toes turned out prior to moving the walker and the rest of patient's body as to prevent internal rotation/ turning in of the leg. Abduction pillows are the most effective way to prevent a patient from not crossing legs or turning toes in at rest. If an abduction pillow is not ordered placing a regular pillow length wise between the patient's legs is also an effective reminder. It is imperative that these precautions be maintained so that the surgical hip does not dislocate. Follow-up:in 2 weeks Disposition - Home Discharged Condition: good       Discharge Orders    Future Orders Please Complete By Expires   Diet - low sodium heart healthy      Diet Carb Modified      Call MD / Call 911      Comments:   If you experience chest pain or shortness of breath, CALL 911 and be transported to the hospital emergency room.  If you develope a fever above 101 F, pus (white drainage) or increased drainage or redness at the wound, or calf pain, call your surgeon's office.   Discharge  instructions      Comments:   Pick up stool softner and laxative for home. Do not submerge incision under water. May shower. Continue to use ice for pain and swelling from surgery. Hip precautions.  Total Hip Protocol.  Take Xarelto for two and a half more weeks, then discontinue Xarelto.   Constipation Prevention      Comments:   Drink plenty of  fluids.  Prune juice may be helpful.  You may use a stool softener, such as Colace (over the counter) 100 mg twice a day.  Use MiraLax (over the counter) for constipation as needed.   Increase activity slowly as tolerated      Patient may shower      Comments:   You may shower without a dressing once there is no drainage.  Do not wash over the wound.  If drainage remains, do not shower until drainage stops.   Weight bearing as tolerated      Driving restrictions      Comments:   No driving until released by the physician.   Lifting restrictions      Comments:   No lifting until released by the physician.   Follow the hip precautions as taught in Physical Therapy      Change dressing      Comments:   You may change your dressing dressing daily with sterile 4 x 4 inch gauze dressing and paper tape.  Do not submerge the incision under water.   TED hose      Comments:   Use stockings (TED hose) for 3 weeks on both leg(s).  You may remove them at night for sleeping.   Do not sit on low chairs, stoools or toilet seats, as it may be difficult to get up from low surfaces          Medication List     As of 06/26/2012  8:53 AM    STOP taking these medications         cholecalciferol 1000 UNITS tablet   Commonly known as: VITAMIN D      folic acid 400 MCG tablet   Commonly known as: FOLVITE      ibandronate 150 MG tablet   Commonly known as: BONIVA      methotrexate 25 MG/ML Soln      nabumetone 750 MG tablet   Commonly known as: RELAFEN      TAKE these medications         leflunomide 20 MG tablet   Commonly known as: ARAVA   Take 20 mg by mouth daily before breakfast.      metFORMIN 500 MG tablet   Commonly known as: GLUCOPHAGE   Take 500 mg by mouth 2 (two) times daily with a meal.      methocarbamol 500 MG tablet   Commonly known as: ROBAXIN   Take 1 tablet (500 mg total) by mouth every 6 (six) hours as needed.      oxyCODONE 5 MG immediate release tablet   Commonly  known as: Oxy IR/ROXICODONE   Take 1-2 tablets (5-10 mg total) by mouth every 3 (three) hours as needed.      predniSONE 5 MG tablet   Commonly known as: DELTASONE   Take 1 tablet (5 mg total) by mouth as directed. 3 tabs (15mg )  this evening 06/13/12.  2 tabs (10mg )  twice a day on 06/14/12.  Then resume the 9 mg dosing daily starting 06/16/11 and take the 5  mg tablet with the 4 mg to equal the 9 mg dosing.      ramipril 5 MG tablet   Commonly known as: ALTACE   Take 5 mg by mouth daily before breakfast.      rivaroxaban 10 MG Tabs tablet   Commonly known as: XARELTO   Take 1 tablet (10 mg total) by mouth daily with breakfast. Take Xarelto for two and a half more weeks, then discontinue Xarelto.        Follow-up Information    Follow up with Loanne Drilling, MD. Schedule an appointment as soon as possible for a visit in 2 weeks.   Contact information:   [address], [address] [place] [address] [phone]          Signed: Patrica Duel 06/26/2012, 8:53 AM

## 2015-03-25 ENCOUNTER — Inpatient Hospital Stay (HOSPITAL_COMMUNITY): Payer: BLUE CROSS/BLUE SHIELD

## 2015-03-25 ENCOUNTER — Inpatient Hospital Stay (HOSPITAL_COMMUNITY)
Admission: EM | Admit: 2015-03-25 | Discharge: 2015-03-30 | DRG: 545 | Disposition: A | Discharge status: Home / Self Care | Payer: BLUE CROSS/BLUE SHIELD | Attending: Internal Medicine | Admitting: Internal Medicine

## 2015-03-25 ENCOUNTER — Encounter (HOSPITAL_COMMUNITY)
Admission: EM | Disposition: A | Discharge status: Home / Self Care | Payer: Self-pay | Source: Home / Self Care | Attending: Internal Medicine

## 2015-03-25 ENCOUNTER — Emergency Department (HOSPITAL_COMMUNITY): Payer: BLUE CROSS/BLUE SHIELD

## 2015-03-25 ENCOUNTER — Encounter (HOSPITAL_COMMUNITY): Payer: Self-pay | Admitting: Emergency Medicine

## 2015-03-25 DIAGNOSIS — J918 Pleural effusion in other conditions classified elsewhere: Secondary | ICD-10-CM

## 2015-03-25 DIAGNOSIS — M0530 Rheumatoid heart disease with rheumatoid arthritis of unspecified site: Principal | ICD-10-CM | POA: Diagnosis present

## 2015-03-25 DIAGNOSIS — I319 Disease of pericardium, unspecified: Secondary | ICD-10-CM | POA: Diagnosis not present

## 2015-03-25 DIAGNOSIS — I447 Left bundle-branch block, unspecified: Secondary | ICD-10-CM | POA: Diagnosis present

## 2015-03-25 DIAGNOSIS — R0902 Hypoxemia: Secondary | ICD-10-CM | POA: Diagnosis not present

## 2015-03-25 DIAGNOSIS — Z87891 Personal history of nicotine dependence: Secondary | ICD-10-CM

## 2015-03-25 DIAGNOSIS — Z86718 Personal history of other venous thrombosis and embolism: Secondary | ICD-10-CM

## 2015-03-25 DIAGNOSIS — Z7901 Long term (current) use of anticoagulants: Secondary | ICD-10-CM | POA: Diagnosis not present

## 2015-03-25 DIAGNOSIS — I4891 Unspecified atrial fibrillation: Secondary | ICD-10-CM | POA: Diagnosis not present

## 2015-03-25 DIAGNOSIS — I5041 Acute combined systolic (congestive) and diastolic (congestive) heart failure: Secondary | ICD-10-CM | POA: Diagnosis present

## 2015-03-25 DIAGNOSIS — I313 Pericardial effusion (noninflammatory): Secondary | ICD-10-CM | POA: Diagnosis present

## 2015-03-25 DIAGNOSIS — R000 Tachycardia, unspecified: Secondary | ICD-10-CM | POA: Diagnosis present

## 2015-03-25 DIAGNOSIS — I480 Paroxysmal atrial fibrillation: Secondary | ICD-10-CM | POA: Insufficient documentation

## 2015-03-25 DIAGNOSIS — Z79899 Other long term (current) drug therapy: Secondary | ICD-10-CM | POA: Diagnosis not present

## 2015-03-25 DIAGNOSIS — M21372 Foot drop, left foot: Secondary | ICD-10-CM | POA: Diagnosis present

## 2015-03-25 DIAGNOSIS — I10 Essential (primary) hypertension: Secondary | ICD-10-CM | POA: Diagnosis present

## 2015-03-25 DIAGNOSIS — Z9114 Patient's other noncompliance with medication regimen: Secondary | ICD-10-CM | POA: Diagnosis present

## 2015-03-25 DIAGNOSIS — I429 Cardiomyopathy, unspecified: Secondary | ICD-10-CM | POA: Diagnosis present

## 2015-03-25 DIAGNOSIS — R0602 Shortness of breath: Secondary | ICD-10-CM | POA: Diagnosis not present

## 2015-03-25 DIAGNOSIS — I3139 Other pericardial effusion (noninflammatory): Secondary | ICD-10-CM | POA: Diagnosis present

## 2015-03-25 DIAGNOSIS — I471 Supraventricular tachycardia: Secondary | ICD-10-CM | POA: Diagnosis not present

## 2015-03-25 DIAGNOSIS — Z79891 Long term (current) use of opiate analgesic: Secondary | ICD-10-CM | POA: Diagnosis not present

## 2015-03-25 DIAGNOSIS — E119 Type 2 diabetes mellitus without complications: Secondary | ICD-10-CM | POA: Diagnosis present

## 2015-03-25 DIAGNOSIS — Z7952 Long term (current) use of systemic steroids: Secondary | ICD-10-CM | POA: Diagnosis not present

## 2015-03-25 DIAGNOSIS — Z96642 Presence of left artificial hip joint: Secondary | ICD-10-CM | POA: Diagnosis present

## 2015-03-25 DIAGNOSIS — I420 Dilated cardiomyopathy: Secondary | ICD-10-CM

## 2015-03-25 DIAGNOSIS — Z91048 Other nonmedicinal substance allergy status: Secondary | ICD-10-CM

## 2015-03-25 DIAGNOSIS — E871 Hypo-osmolality and hyponatremia: Secondary | ICD-10-CM | POA: Diagnosis present

## 2015-03-25 DIAGNOSIS — R0681 Apnea, not elsewhere classified: Secondary | ICD-10-CM | POA: Diagnosis not present

## 2015-03-25 DIAGNOSIS — M069 Rheumatoid arthritis, unspecified: Secondary | ICD-10-CM | POA: Diagnosis not present

## 2015-03-25 DIAGNOSIS — Z882 Allergy status to sulfonamides status: Secondary | ICD-10-CM | POA: Diagnosis not present

## 2015-03-25 HISTORY — PX: CARDIAC CATHETERIZATION: SHX172

## 2015-03-25 LAB — CBC
HCT: 43.5 % (ref 39.0–52.0)
Hemoglobin: 14.4 g/dL (ref 13.0–17.0)
MCH: 31.9 pg (ref 26.0–34.0)
MCHC: 33.1 g/dL (ref 30.0–36.0)
MCV: 96.5 fL (ref 78.0–100.0)
Platelets: 403 10*3/uL — ABNORMAL HIGH (ref 150–400)
RBC: 4.51 MIL/uL (ref 4.22–5.81)
RDW: 15.1 % (ref 11.5–15.5)
WBC: 12.3 10*3/uL — ABNORMAL HIGH (ref 4.0–10.5)

## 2015-03-25 LAB — BASIC METABOLIC PANEL
Anion gap: 9 (ref 5–15)
BUN: 26 mg/dL — ABNORMAL HIGH (ref 6–20)
CO2: 21 mmol/L — ABNORMAL LOW (ref 22–32)
Calcium: 8.7 mg/dL — ABNORMAL LOW (ref 8.9–10.3)
Chloride: 104 mmol/L (ref 101–111)
Creatinine, Ser: 1.23 mg/dL (ref 0.61–1.24)
GFR calc Af Amer: >60 mL/min (ref >60)
GFR calc non Af Amer: >60 mL/min (ref >60)
Glucose, Bld: 166 mg/dL — ABNORMAL HIGH (ref 65–99)
Potassium: 4.5 mmol/L (ref 3.5–5.1)
Sodium: 134 mmol/L — ABNORMAL LOW (ref 135–145)

## 2015-03-25 LAB — URINE MICROSCOPIC-ADD ON

## 2015-03-25 LAB — URINALYSIS, ROUTINE W REFLEX MICROSCOPIC
Glucose, UA: 250 mg/dL — AB
Hgb urine dipstick: NEGATIVE
Ketones, ur: 15 mg/dL — AB
Nitrite: POSITIVE — AB
Protein, ur: 30 mg/dL — AB
Specific Gravity, Urine: 1.037 — ABNORMAL HIGH (ref 1.005–1.030)
Urobilinogen, UA: 1 mg/dL (ref 0.0–1.0)
pH: 5.5 (ref 5.0–8.0)

## 2015-03-25 LAB — I-STAT TROPONIN, ED: Troponin i, poc: 0.01 ng/mL (ref 0.00–0.08)

## 2015-03-25 LAB — C-REACTIVE PROTEIN: CRP: 29 mg/dL — ABNORMAL HIGH (ref <1.0)

## 2015-03-25 SURGERY — RIGHT HEART CATH

## 2015-03-25 MED ORDER — FOLIC ACID 1 MG PO TABS
1.0000 mg | ORAL_TABLET | Freq: Every day | ORAL | Status: DC
  Administered 2015-03-26 – 2015-03-30 (×5): 1 mg via ORAL
  Filled 2015-03-25 (×5): qty 1

## 2015-03-25 MED ORDER — ACETAMINOPHEN 650 MG RE SUPP: 650.0000 mg | Freq: Four times a day (QID) | RECTAL | Status: DC | PRN

## 2015-03-25 MED ORDER — RAMIPRIL 5 MG PO CAPS
5.0000 mg | ORAL_CAPSULE | Freq: Every day | ORAL | Status: DC
  Filled 2015-03-25: qty 1

## 2015-03-25 MED ORDER — MORPHINE SULFATE 2 MG/ML IJ SOLN
2.0000 mg | INTRAMUSCULAR | Status: DC | PRN
  Administered 2015-03-27: 2 mg via INTRAVENOUS
  Filled 2015-03-25: qty 1

## 2015-03-25 MED ORDER — LIDOCAINE HCL (PF) 1 % IJ SOLN
INTRAMUSCULAR | Status: AC
  Filled 2015-03-25: qty 30

## 2015-03-25 MED ORDER — MIDAZOLAM HCL 2 MG/2ML IJ SOLN
INTRAMUSCULAR | Status: AC
  Filled 2015-03-25: qty 2

## 2015-03-25 MED ORDER — SODIUM CHLORIDE 0.9 % IV BOLUS (SEPSIS)
1000.0000 mL | Freq: Once | INTRAVENOUS | Status: AC
  Administered 2015-03-25: 1000 mL via INTRAVENOUS

## 2015-03-25 MED ORDER — ONDANSETRON 4 MG PO TBDP
4.0000 mg | ORAL_TABLET | Freq: Two times a day (BID) | ORAL | Status: DC
  Administered 2015-03-27 – 2015-03-30 (×5): 4 mg via ORAL
  Filled 2015-03-25 (×11): qty 1

## 2015-03-25 MED ORDER — COLCHICINE 0.6 MG PO TABS
0.6000 mg | ORAL_TABLET | Freq: Two times a day (BID) | ORAL | Status: DC
  Administered 2015-03-26 – 2015-03-30 (×9): 0.6 mg via ORAL
  Filled 2015-03-25 (×10): qty 1

## 2015-03-25 MED ORDER — SODIUM CHLORIDE 0.9 % IJ SOLN: 3.0000 mL | INTRAMUSCULAR | Status: DC | PRN

## 2015-03-25 MED ORDER — SODIUM CHLORIDE 0.9 % IJ SOLN
3.0000 mL | Freq: Two times a day (BID) | INTRAMUSCULAR | Status: DC
  Administered 2015-03-26: 3 mL via INTRAVENOUS
  Administered 2015-03-26: 09:00:00 via INTRAVENOUS
  Administered 2015-03-27 – 2015-03-30 (×5): 3 mL via INTRAVENOUS

## 2015-03-25 MED ORDER — IBUPROFEN 200 MG PO TABS
600.0000 mg | ORAL_TABLET | Freq: Once | ORAL | Status: AC
  Administered 2015-03-25: 600 mg via ORAL
  Filled 2015-03-25: qty 3

## 2015-03-25 MED ORDER — VITAMIN D 1000 UNITS PO TABS
1000.0000 [IU] | ORAL_TABLET | Freq: Every day | ORAL | Status: DC
  Administered 2015-03-26 – 2015-03-30 (×5): 1000 [IU] via ORAL
  Filled 2015-03-25 (×5): qty 1

## 2015-03-25 MED ORDER — PREDNISONE 10 MG PO TABS
10.0000 mg | ORAL_TABLET | Freq: Every day | ORAL | Status: DC
  Administered 2015-03-26 – 2015-03-30 (×5): 10 mg via ORAL
  Filled 2015-03-25 (×5): qty 1

## 2015-03-25 MED ORDER — SODIUM CHLORIDE 0.9 % IV SOLN
250.0000 mL | INTRAVENOUS | Status: DC | PRN
  Administered 2015-03-25: 250 mL via INTRAVENOUS

## 2015-03-25 MED ORDER — ACETAMINOPHEN 325 MG PO TABS
650.0000 mg | ORAL_TABLET | Freq: Four times a day (QID) | ORAL | Status: DC | PRN
Start: 2015-03-25 — End: 2015-03-30

## 2015-03-25 MED ORDER — SODIUM CHLORIDE 0.9 % IV SOLN
INTRAVENOUS | Status: DC
  Administered 2015-03-25: 250 mL via INTRAVENOUS

## 2015-03-25 MED ORDER — HEPARIN SODIUM (PORCINE) 5000 UNIT/ML IJ SOLN
5000.0000 [IU] | Freq: Three times a day (TID) | INTRAMUSCULAR | Status: DC
  Administered 2015-03-25 – 2015-03-30 (×12): 5000 [IU] via SUBCUTANEOUS
  Filled 2015-03-25 (×19): qty 1

## 2015-03-25 MED ORDER — PANTOPRAZOLE SODIUM 40 MG PO TBEC
40.0000 mg | DELAYED_RELEASE_TABLET | Freq: Every day | ORAL | Status: DC
Start: 2015-03-26 — End: 2015-03-30
  Administered 2015-03-26 – 2015-03-30 (×5): 40 mg via ORAL
  Filled 2015-03-25 (×5): qty 1

## 2015-03-25 MED ORDER — MIDAZOLAM HCL 2 MG/2ML IJ SOLN
INTRAMUSCULAR | Status: DC | PRN
  Administered 2015-03-25: 1 mg via INTRAVENOUS

## 2015-03-25 MED ORDER — DOCUSATE SODIUM 100 MG PO CAPS
100.0000 mg | ORAL_CAPSULE | Freq: Two times a day (BID) | ORAL | Status: DC
  Administered 2015-03-25 – 2015-03-30 (×7): 100 mg via ORAL
  Filled 2015-03-25 (×11): qty 1

## 2015-03-25 MED ORDER — COLCHICINE 0.6 MG PO TABS
0.6000 mg | ORAL_TABLET | Freq: Once | ORAL | Status: AC
  Administered 2015-03-25: 0.6 mg via ORAL
  Filled 2015-03-25: qty 1

## 2015-03-25 MED ORDER — NAPROXEN SODIUM 275 MG PO TABS
275.0000 mg | ORAL_TABLET | Freq: Every day | ORAL | Status: DC
  Administered 2015-03-26: 275 mg via ORAL
  Filled 2015-03-25 (×2): qty 1

## 2015-03-25 MED ORDER — ONDANSETRON HCL 4 MG/2ML IJ SOLN: 4.0000 mg | Freq: Four times a day (QID) | INTRAMUSCULAR | Status: DC | PRN

## 2015-03-25 MED ORDER — ONDANSETRON HCL 4 MG PO TABS: 4.0000 mg | ORAL_TABLET | Freq: Four times a day (QID) | ORAL | Status: DC | PRN

## 2015-03-25 MED ORDER — SODIUM CHLORIDE 0.9 % IJ SOLN
3.0000 mL | Freq: Two times a day (BID) | INTRAMUSCULAR | Status: DC
  Administered 2015-03-25: 3 mL via INTRAVENOUS
  Administered 2015-03-26: 10 mL via INTRAVENOUS
  Administered 2015-03-27 – 2015-03-29 (×2): 3 mL via INTRAVENOUS

## 2015-03-25 MED ORDER — PREDNISONE 20 MG PO TABS
60.0000 mg | ORAL_TABLET | Freq: Once | ORAL | Status: AC
  Administered 2015-03-25: 60 mg via ORAL
  Filled 2015-03-25: qty 3

## 2015-03-25 MED ORDER — HYDROCODONE-ACETAMINOPHEN 10-325 MG PO TABS: 1.0000 | ORAL_TABLET | Freq: Four times a day (QID) | ORAL | Status: DC | PRN

## 2015-03-25 MED ORDER — IOHEXOL 350 MG/ML SOLN
100.0000 mL | Freq: Once | INTRAVENOUS | Status: AC | PRN
  Administered 2015-03-25: 100 mL via INTRAVENOUS

## 2015-03-25 MED ORDER — HEPARIN (PORCINE) IN NACL 2-0.9 UNIT/ML-% IJ SOLN
INTRAMUSCULAR | Status: DC | PRN
  Administered 2015-03-25: 22:00:00

## 2015-03-25 MED ORDER — SODIUM CHLORIDE 0.9 % IV SOLN
INTRAVENOUS | Status: DC | PRN
  Administered 2015-03-25: 10 mL/h via INTRAVENOUS

## 2015-03-25 SURGICAL SUPPLY — 7 items
CATH SWAN GANZ 7F STRAIGHT (CATHETERS) ×2 IMPLANT
ELECT DEFIB PAD ADLT CADENCE (PAD) ×2 IMPLANT
KIT HEART LEFT (KITS) ×2 IMPLANT
PACK CARDIAC CATHETERIZATION (CUSTOM PROCEDURE TRAY) ×2 IMPLANT
SHEATH PINNACLE 7F 10CM (SHEATH) ×2 IMPLANT
SLEEVE REPOSITIONING LENGTH 30 (MISCELLANEOUS) ×2 IMPLANT
TRANSDUCER W/STOPCOCK (MISCELLANEOUS) ×2 IMPLANT

## 2015-03-25 NOTE — ED Notes (Signed)
Pt transported to CT ?

## 2015-03-25 NOTE — Consult Note (Signed)
CONSULTATION NOTE  Reason for Consult: Pericardial effusion  Requesting Physician: Dr. Wyline Copas  Cardiologist: None (NEW)  HPI: This is a 64 y.o. male with a past medical history significant for hypertension, diet-controlled diabetes, kidney stones, rheumatoid arthritis, a number of prior surgeries including left total hip replacement, cataract extraction, Achilles tendon surgery, and history of ruptured lumbar disc who presented to emergency department with complaints of chest pain. In fact he was seen 5 days prior to this admission and was discharged home. He was then readmitted with shortness of breath and sinus tachycardia. A chest CT was performed which is negative for PE however there is noted to be a moderate sized pericardial effusion. Cardiology was consulted for evaluation and management of his pericardial effusion. Currently he reports his chest pain is improved with steroid-induced and colchicine given in the ER, however he still is short of breath and his extremities are cool and clammy. He is tachycardic and EKG demonstrates a left bundle branch block. He tells me this is an old finding that was present when he had hip surgery a couple of years ago but his primary care provider did not put him through any additional cardiac workup. Labs here do indicate a leukocytosis at 12,300, elevated CRP at 29. CT of the chest was negative for pulmonary embolism and no evidence for coronary artery calcification. There is a moderate sized pericardial effusion which appears free flowing. Echo confirms a moderate size free-flowing pericardial effusion measuring between 1 and 1.5 cm with features of tap and on physiology including dilated IVC, respiratory variation in the mitral tricuspid inflow and underfilling of the right heart. There is paradoxical septal motion however this is likely related to left bundle branch block.  PMHx:  Past Medical History  Diagnosis Date  . Hypertension   . Arthritis    . Ruptured lumbar disc     L5-S1   x 2 disc  . Kidney stones   . Foot drop, left   . Osteomyelitis 2010    left foot  . Osteomyelitis 2008/2010    left  foot- metatarsal- per patient was MRSA  . Diabetes mellitus     diet controlled   Past Surgical History  Procedure Laterality Date  . Foot surgery  2008/2010    x 2  left foot/ osteomyelitis  . Achilles tendon surgery      left  . Eye surgery      cataract ectraction with IOL-  bilateral  . Total hip arthroplasty  06/11/2012    Procedure: TOTAL HIP ARTHROPLASTY;  Surgeon: Gearlean Alf, MD;  Location: [facility];  Service: Orthopedics;  Laterality: Left;    FAMHx: No family history on file. No significant family history of CAD.  SOCHx:  reports that he quit smoking about 8 years ago. He has never used smokeless tobacco. He reports that he drinks alcohol. He reports that he does not use illicit drugs.  ALLERGIES: Allergies  Allergen Reactions  . Sulfa Drugs Cross Reactors Other (See Comments)    Flu like symptoms   . Tape     Tears his skin    ROS: A comprehensive review of systems was negative except for: Respiratory: positive for dyspnea on exertion Cardiovascular: positive for chest pain  HOME MEDICATIONS: Prescriptions prior to admission  Medication Sig Dispense Refill Last Dose  . aspirin 81 MG tablet Take 81 mg by mouth daily as needed for pain.   03/24/2015 at Unknown time  . docusate sodium (COLACE)  100 MG capsule Take 100 mg by mouth 2 (two) times daily.   03/24/2015 at Unknown time  . folic acid (FOLVITE) 1 MG tablet Take 1 mg by mouth daily.   03/24/2015 at Unknown time  . HYDROcodone-acetaminophen (NORCO) 10-325 MG per tablet Take 1 tablet by mouth every 6 (six) hours as needed. for pain  0 03/25/2015 at Unknown time  . Methotrexate Sodium (METHOTREXATE, PF,) 50 MG/2ML injection Inject 0.8 mLs as directed once a week. Inject 0.8 mLs as directed once a week.   03/24/2015 at Unknown time  . naproxen sodium (RA  NAPROXEN SODIUM) 220 MG tablet Take 220 mg by mouth daily. Take 1 220 mg tablet daily   03/24/2015 at Unknown time  . ondansetron (ZOFRAN-ODT) 4 MG disintegrating tablet Take 4 mg by mouth 2 (two) times daily.  0 03/25/2015 at Unknown time  . pantoprazole (PROTONIX) 40 MG tablet Take 40 mg by mouth daily.  0 03/24/2015 at Unknown time  . predniSONE (DELTASONE) 10 MG tablet Take 10 mg by mouth daily. Take 1 65m tablet daily   03/24/2015 at Unknown time  . ramipril (ALTACE) 5 MG tablet Take 5 mg by mouth daily before breakfast.   03/24/2015 at Unknown time  . traMADol (ULTRAM) 50 MG tablet Take 50 mg by mouth daily as needed. Take 1 531mtablet daily as needed     . Vitamin D, Ergocalciferol, (DRISDOL) 50000 UNITS CAPS capsule Take 1,000 Units by mouth daily.   03/24/2015 at Unknown time  . methocarbamol (ROBAXIN) 500 MG tablet Take 1 tablet (500 mg total) by mouth every 6 (six) hours as needed. (Patient not taking: Reported on 03/25/2015) 80 tablet 0 Not Taking at Unknown time  . oxyCODONE (OXY IR/ROXICODONE) 5 MG immediate release tablet Take 1-2 tablets (5-10 mg total) by mouth every 3 (three) hours as needed. (Patient not taking: Reported on 03/25/2015) 80 tablet 0 Not Taking at Unknown time  . predniSONE (DELTASONE) 5 MG tablet Take 1 tablet (5 mg total) by mouth as directed. 3 tabs (1544m this evening 06/13/12.  2 tabs (42m48mtwice a day on 06/14/12.  Then resume the 9 mg dosing daily starting 06/16/11 and take the 5 mg tablet with the 4 mg to equal the 9 mg dosing. (Patient not taking: Reported on 03/25/2015) 50 tablet 0 Not Taking at Unknown time  . rivaroxaban (XARELTO) 10 MG TABS tablet Take 1 tablet (10 mg total) by mouth daily with breakfast. Take Xarelto for two and a half more weeks, then discontinue Xarelto. (Patient not taking: Reported on 03/25/2015) 18 tablet 0 Not Taking at Unknown time    HOSPITAL MEDICATIONS: Prior to Admission:  Prescriptions prior to admission  Medication Sig Dispense  Refill Last Dose  . aspirin 81 MG tablet Take 81 mg by mouth daily as needed for pain.   03/24/2015 at Unknown time  . docusate sodium (COLACE) 100 MG capsule Take 100 mg by mouth 2 (two) times daily.   03/24/2015 at Unknown time  . folic acid (FOLVITE) 1 MG tablet Take 1 mg by mouth daily.   03/24/2015 at Unknown time  . HYDROcodone-acetaminophen (NORCO) 10-325 MG per tablet Take 1 tablet by mouth every 6 (six) hours as needed. for pain  0 03/25/2015 at Unknown time  . Methotrexate Sodium (METHOTREXATE, PF,) 50 MG/2ML injection Inject 0.8 mLs as directed once a week. Inject 0.8 mLs as directed once a week.   03/24/2015 at Unknown time  . naproxen sodium (RA  NAPROXEN SODIUM) 220 MG tablet Take 220 mg by mouth daily. Take 1 220 mg tablet daily   03/24/2015 at Unknown time  . ondansetron (ZOFRAN-ODT) 4 MG disintegrating tablet Take 4 mg by mouth 2 (two) times daily.  0 03/25/2015 at Unknown time  . pantoprazole (PROTONIX) 40 MG tablet Take 40 mg by mouth daily.  0 03/24/2015 at Unknown time  . predniSONE (DELTASONE) 10 MG tablet Take 10 mg by mouth daily. Take 1 23m tablet daily   03/24/2015 at Unknown time  . ramipril (ALTACE) 5 MG tablet Take 5 mg by mouth daily before breakfast.   03/24/2015 at Unknown time  . traMADol (ULTRAM) 50 MG tablet Take 50 mg by mouth daily as needed. Take 1 576mtablet daily as needed     . Vitamin D, Ergocalciferol, (DRISDOL) 50000 UNITS CAPS capsule Take 1,000 Units by mouth daily.   03/24/2015 at Unknown time  . methocarbamol (ROBAXIN) 500 MG tablet Take 1 tablet (500 mg total) by mouth every 6 (six) hours as needed. (Patient not taking: Reported on 03/25/2015) 80 tablet 0 Not Taking at Unknown time  . oxyCODONE (OXY IR/ROXICODONE) 5 MG immediate release tablet Take 1-2 tablets (5-10 mg total) by mouth every 3 (three) hours as needed. (Patient not taking: Reported on 03/25/2015) 80 tablet 0 Not Taking at Unknown time  . predniSONE (DELTASONE) 5 MG tablet Take 1 tablet (5 mg total)  by mouth as directed. 3 tabs (1567m this evening 06/13/12.  2 tabs (57m7mtwice a day on 06/14/12.  Then resume the 9 mg dosing daily starting 06/16/11 and take the 5 mg tablet with the 4 mg to equal the 9 mg dosing. (Patient not taking: Reported on 03/25/2015) 50 tablet 0 Not Taking at Unknown time  . rivaroxaban (XARELTO) 10 MG TABS tablet Take 1 tablet (10 mg total) by mouth daily with breakfast. Take Xarelto for two and a half more weeks, then discontinue Xarelto. (Patient not taking: Reported on 03/25/2015) 18 tablet 0 Not Taking at Unknown time    VITALS: Blood pressure 139/91, pulse 102, temperature 98 F (36.7 C), temperature source Oral, resp. rate 18, height 6' 6"  (1.981 m), weight 212 lb 8.4 oz (96.4 kg), SpO2 94 %.  PHYSICAL EXAM: General appearance: alert and no distress Neck: JVD - 3 cm above sternal notch and no carotid bruit Lungs: clear to auscultation bilaterally Heart: regular tachycardia - somewhat distant heart sounds, no murmur Abdomen: soft, non-tender; bowel sounds normal; no masses,  no organomegaly Extremities: cool, pale, diaphoretic Pulses: 1+ pulses Skin: pale, cool, diaphoretic Neurologic: Mental status: Alert and oriented, follows commands Psych: Mildly anxious  LABS: Results for orders placed or performed during the hospital encounter of 03/25/15 (from the past 48 hour(s))  Basic metabolic panel     Status: Abnormal   Collection Time: 03/25/15 10:52 AM  Result Value Ref Range   Sodium 134 (L) 135 - 145 mmol/L   Potassium 4.5 3.5 - 5.1 mmol/L   Chloride 104 101 - 111 mmol/L   CO2 21 (L) 22 - 32 mmol/L   Glucose, Bld 166 (H) 65 - 99 mg/dL   BUN 26 (H) 6 - 20 mg/dL   Creatinine, Ser 1.23 0.61 - 1.24 mg/dL   Calcium 8.7 (L) 8.9 - 10.3 mg/dL   GFR calc non Af Amer >60 >60 mL/min   GFR calc Af Amer >60 >60 mL/min    Comment: (NOTE) The eGFR has been calculated using the CKD EPI equation.  This calculation has not been validated in all clinical  situations. eGFR's persistently <60 mL/min signify possible Chronic Kidney Disease.    Anion gap 9 5 - 15  CBC     Status: Abnormal   Collection Time: 03/25/15 10:52 AM  Result Value Ref Range   WBC 12.3 (H) 4.0 - 10.5 K/uL   RBC 4.51 4.22 - 5.81 MIL/uL   Hemoglobin 14.4 13.0 - 17.0 g/dL   HCT 43.5 39.0 - 52.0 %   MCV 96.5 78.0 - 100.0 fL   MCH 31.9 26.0 - 34.0 pg   MCHC 33.1 30.0 - 36.0 g/dL   RDW 15.1 11.5 - 15.5 %   Platelets 403 (H) 150 - 400 K/uL  I-stat troponin, ED     Status: None   Collection Time: 03/25/15 11:08 AM  Result Value Ref Range   Troponin i, poc 0.01 0.00 - 0.08 ng/mL   Comment 3            Comment: Due to the release kinetics of cTnI, a negative result within the first hours of the onset of symptoms does not rule out myocardial infarction with certainty. If myocardial infarction is still suspected, repeat the test at appropriate intervals.   Urinalysis, Routine w reflex microscopic (not at [facility])     Status: Abnormal   Collection Time: 03/25/15 12:04 PM  Result Value Ref Range   Color, Urine ORANGE (A) YELLOW    Comment: BIOCHEMICALS MAY BE AFFECTED BY COLOR   APPearance CLOUDY (A) CLEAR   Specific Gravity, Urine 1.037 (H) 1.005 - 1.030   pH 5.5 5.0 - 8.0   Glucose, UA 250 (A) NEGATIVE mg/dL   Hgb urine dipstick NEGATIVE NEGATIVE   Bilirubin Urine MODERATE (A) NEGATIVE   Ketones, ur 15 (A) NEGATIVE mg/dL   Protein, ur 30 (A) NEGATIVE mg/dL   Urobilinogen, UA 1.0 0.0 - 1.0 mg/dL   Nitrite POSITIVE (A) NEGATIVE   Leukocytes, UA TRACE (A) NEGATIVE  Urine microscopic-add on     Status: Abnormal   Collection Time: 03/25/15 12:04 PM  Result Value Ref Range   Squamous Epithelial / LPF RARE RARE   WBC, UA 0-2 <3 WBC/hpf   RBC / HPF 0-2 <3 RBC/hpf   Bacteria, UA FEW (A) RARE   Casts HYALINE CASTS (A) NEGATIVE   Urine-Other MUCOUS PRESENT   C-reactive protein     Status: Abnormal   Collection Time: 03/25/15  2:12 PM  Result Value Ref Range   CRP  29.0 (H) <1.0 mg/dL    Comment: Performed at [place]: Dg Chest 2 View  03/25/2015   CLINICAL DATA:  Gradual onset mid chest pain this morning.  EXAM: CHEST  2 VIEW  COMPARISON:  Chest x-ray dated 06/05/2012.  FINDINGS: There is mild cardiomegaly which may be somewhat accentuated by low lung volumes. Ill-defined airspace opacity is seen at the right lung base and there is a denser opacity at the left lung base. Suspect small bilateral pleural effusions, left greater than right. Upper lungs are relatively clear. No pneumothorax. No osseous abnormality seen.  IMPRESSION: 1. Cardiomegaly. 2. Bibasilar airspace opacities, left greater than right. These could be atelectasis or pneumonia. 3. Small bilateral pleural effusions, left greater than right.   Electronically Signed   By: Franki Cabot M.D.   On: 03/25/2015 11:16   Ct Angio Chest Pe W/cm &/or Wo Cm  03/25/2015   CLINICAL DATA:  Central chest pain for 1 week, tachycardia,  hypoxemia, shortness of breath.  EXAM: CT ANGIOGRAPHY CHEST WITH CONTRAST  TECHNIQUE: Multidetector CT imaging of the chest was performed using the standard protocol during bolus administration of intravenous contrast. Multiplanar CT image reconstructions and MIPs were obtained to evaluate the vascular anatomy.  CONTRAST:  164m OMNIPAQUE IOHEXOL 350 MG/ML SOLN  COMPARISON:  None.  FINDINGS: There is no pulmonary embolism identified within the main, lobar, or central segmental pulmonary arteries. Some of the most peripheral segmental and subsegmental pulmonary arteries are difficult to definitively characterize due to mild patient breathing motion artifact.  There is a moderate-sized pericardial effusion, measuring 2 cm greatest thickness. Heart size is upper normal. No masses or enlarged lymph nodes seen within the mediastinum or perihilar regions. Thoracic aorta is normal in caliber. No aortic aneurysm or dissection.  There are bilateral pleural effusions, small to  moderate in size, with adjacent compressive atelectasis. Additional mild atelectasis noted within the right middle lobe and right upper lobe. Small emphysematous blebs at each lung apex. Trachea and central bronchi are unremarkable.  Limited images of the upper abdomen are also provided. There are scattered small hypodense foci within the bilateral liver lobes, incompletely imaged, which are too small to definitively characterize but probably cysts. Mild degenerative changes noted within the thoracic spine but no acute osseous abnormality.  Review of the MIP images confirms the above findings.  IMPRESSION: 1. Pericardial effusion, moderate in size, measuring 2 cm greatest thickness. 2. No pulmonary embolism seen, with mild study limitations detailed above. 3. Bilateral pleural effusions, small to moderate in size (left slightly greater than right), with adjacent compressive atelectasis. 4. Scattered tiny hypodense foci within the bilateral liver lobes, incompletely imaged, too small to characterize but probably benign cysts. 5. Mild biapical emphysematous change. These results were called by telephone at the time of interpretation on 03/25/2015 at 1:07 pm to Dr. SVirgel Manifold, who verbally acknowledged these results.   Electronically Signed   By: SFranki CabotM.D.   On: 03/25/2015 13:09   ECHO: Study Conclusions  - Left ventricle: The cavity size was normal. There was mild concentric hypertrophy. Systolic function was mildly to moderately reduced. The estimated ejection fraction was in the range of 40% to 45%. Diffuse hypokinesis. - Ventricular septum: Septal motion showed paradox. - Aortic valve: There was no regurgitation. - Mitral valve: Structurally normal valve. - Left atrium: The atrium was normal in size. - Right ventricle: Systolic function was normal. - Right atrium: The atrium was normal in size. - Tricuspid valve: There was mild regurgitation. - Pulmonic valve: There was no  regurgitation.  Impressions:  - LVEF is mildly decreased, the assessment might be affected by tachycardia during acquisition.  There is moderate circumferential pericardial effusion (1.0 - 1.6 cm) with signs suggestive of tamponade - respiratory variations in mitral inflow 35%, in tricuspid inflow 38%, limited RV diastolic filling and dilated, non-collapsing IVC.   HOSPITAL DIAGNOSES: Principal Problem:   Pericarditis Active Problems:   Rheumatoid arthritis   SOB (shortness of breath)   Sinus tachycardia   Pericardial effusion   Acute combined systolic and diastolic heart failure   IMPRESSION: 1. Acute pericardial effusion with tamponade features 2. Acute systolic heart failure with a cardiomyopathy and EF which is moderate to severely reduced 3. Progressive shortness of breath and tachycardia  RECOMMENDATION: 1. Mr. ASanromanis presenting with 5 days of progressive chest pain in the interval development of a pericardial effusion which is now moderate demonstrating signs of tamponade physiology on echo.  In addition there is a cardiomyopathy. His EF was read as mildly decreased however my review of the echo suggested his EF is actually moderate to severely reduced perhaps in the 30-35% range. Physical exam also confirms poor cardiac output and although blood pressure is within normal limits, he has cool extremities and is pale and diaphoretic. I suspect this is due to low cardiac output which could be due to his cardiomyopathy or tamponade features. CRP is elevated which is difficult to place as to whether it is due to RA, inflammatory pericarditis and/or possible myocarditis. His troponin initially however is negative, suggesting against myocarditis. The RV appears thickened on echo and highly echogenic. I discussed the case with Dr. Ellyn Hack who is the interventional cardiologist on-call and Dr. Cyndia Bent who is the cardiac surgeon on-call. At this time we will proceed with right  heart catheterization to determine whether there are features of tamponade physiology and/or low cardiac output, this may drive treatment either toward pericardiocentesis and/or optimization of cardiac output. Will move to CCU - discussed with [facility] on call, I will assume care on my service.  Time Spent Directly with Patient: 45 minutes  Pixie Casino, MD, [facility] Attending Cardiologist [place] 03/25/2015, 8:02 PM

## 2015-03-25 NOTE — Plan of Care (Signed)
This NP called by Dr. Debara Pickett stating he would take over care and change pt to cardiology service.  KJKG, NP

## 2015-03-25 NOTE — H&P (View-Only) (Signed)
CONSULTATION NOTE  Reason for Consult: Pericardial effusion  Requesting Physician: Dr. Wyline Copas  Cardiologist: None (NEW)  HPI: This is a 64 y.o. male with a past medical history significant for hypertension, diet-controlled diabetes, kidney stones, rheumatoid arthritis, a number of prior surgeries including left total hip replacement, cataract extraction, Achilles tendon surgery, and history of ruptured lumbar disc who presented to emergency department with complaints of chest pain. In fact he was seen 5 days prior to this admission and was discharged home. He was then readmitted with shortness of breath and sinus tachycardia. A chest CT was performed which is negative for PE however there is noted to be a moderate sized pericardial effusion. Cardiology was consulted for evaluation and management of his pericardial effusion. Currently he reports his chest pain is improved with steroid-induced and colchicine given in the ER, however he still is short of breath and his extremities are cool and clammy. He is tachycardic and EKG demonstrates a left bundle branch block. He tells me this is an old finding that was present when he had hip surgery a couple of years ago but his primary care provider did not put him through any additional cardiac workup. Labs here do indicate a leukocytosis at 12,300, elevated CRP at 29. CT of the chest was negative for pulmonary embolism and no evidence for coronary artery calcification. There is a moderate sized pericardial effusion which appears free flowing. Echo confirms a moderate size free-flowing pericardial effusion measuring between 1 and 1.5 cm with features of tap and on physiology including dilated IVC, respiratory variation in the mitral tricuspid inflow and underfilling of the right heart. There is paradoxical septal motion however this is likely related to left bundle branch block.  PMHx:  Past Medical History  Diagnosis Date  . Hypertension   . Arthritis    . Ruptured lumbar disc     L5-S1   x 2 disc  . Kidney stones   . Foot drop, left   . Osteomyelitis 2010    left foot  . Osteomyelitis 2008/2010    left  foot- metatarsal- per patient was MRSA  . Diabetes mellitus     diet controlled   Past Surgical History  Procedure Laterality Date  . Foot surgery  2008/2010    x 2  left foot/ osteomyelitis  . Achilles tendon surgery      left  . Eye surgery      cataract ectraction with IOL-  bilateral  . Total hip arthroplasty  06/11/2012    Procedure: TOTAL HIP ARTHROPLASTY;  Surgeon: Gearlean Alf, MD;  Location: [facility];  Service: Orthopedics;  Laterality: Left;    FAMHx: No family history on file. No significant family history of CAD.  SOCHx:  reports that he quit smoking about 8 years ago. He has never used smokeless tobacco. He reports that he drinks alcohol. He reports that he does not use illicit drugs.  ALLERGIES: Allergies  Allergen Reactions  . Sulfa Drugs Cross Reactors Other (See Comments)    Flu like symptoms   . Tape     Tears his skin    ROS: A comprehensive review of systems was negative except for: Respiratory: positive for dyspnea on exertion Cardiovascular: positive for chest pain  HOME MEDICATIONS: Prescriptions prior to admission  Medication Sig Dispense Refill Last Dose  . aspirin 81 MG tablet Take 81 mg by mouth daily as needed for pain.   03/24/2015 at Unknown time  . docusate sodium (COLACE)  100 MG capsule Take 100 mg by mouth 2 (two) times daily.   03/24/2015 at Unknown time  . folic acid (FOLVITE) 1 MG tablet Take 1 mg by mouth daily.   03/24/2015 at Unknown time  . HYDROcodone-acetaminophen (NORCO) 10-325 MG per tablet Take 1 tablet by mouth every 6 (six) hours as needed. for pain  0 03/25/2015 at Unknown time  . Methotrexate Sodium (METHOTREXATE, PF,) 50 MG/2ML injection Inject 0.8 mLs as directed once a week. Inject 0.8 mLs as directed once a week.   03/24/2015 at Unknown time  . naproxen sodium (RA  NAPROXEN SODIUM) 220 MG tablet Take 220 mg by mouth daily. Take 1 220 mg tablet daily   03/24/2015 at Unknown time  . ondansetron (ZOFRAN-ODT) 4 MG disintegrating tablet Take 4 mg by mouth 2 (two) times daily.  0 03/25/2015 at Unknown time  . pantoprazole (PROTONIX) 40 MG tablet Take 40 mg by mouth daily.  0 03/24/2015 at Unknown time  . predniSONE (DELTASONE) 10 MG tablet Take 10 mg by mouth daily. Take 1 60m tablet daily   03/24/2015 at Unknown time  . ramipril (ALTACE) 5 MG tablet Take 5 mg by mouth daily before breakfast.   03/24/2015 at Unknown time  . traMADol (ULTRAM) 50 MG tablet Take 50 mg by mouth daily as needed. Take 1 571mtablet daily as needed     . Vitamin D, Ergocalciferol, (DRISDOL) 50000 UNITS CAPS capsule Take 1,000 Units by mouth daily.   03/24/2015 at Unknown time  . methocarbamol (ROBAXIN) 500 MG tablet Take 1 tablet (500 mg total) by mouth every 6 (six) hours as needed. (Patient not taking: Reported on 03/25/2015) 80 tablet 0 Not Taking at Unknown time  . oxyCODONE (OXY IR/ROXICODONE) 5 MG immediate release tablet Take 1-2 tablets (5-10 mg total) by mouth every 3 (three) hours as needed. (Patient not taking: Reported on 03/25/2015) 80 tablet 0 Not Taking at Unknown time  . predniSONE (DELTASONE) 5 MG tablet Take 1 tablet (5 mg total) by mouth as directed. 3 tabs (1539m this evening 06/13/12.  2 tabs (56m80mtwice a day on 06/14/12.  Then resume the 9 mg dosing daily starting 06/16/11 and take the 5 mg tablet with the 4 mg to equal the 9 mg dosing. (Patient not taking: Reported on 03/25/2015) 50 tablet 0 Not Taking at Unknown time  . rivaroxaban (XARELTO) 10 MG TABS tablet Take 1 tablet (10 mg total) by mouth daily with breakfast. Take Xarelto for two and a half more weeks, then discontinue Xarelto. (Patient not taking: Reported on 03/25/2015) 18 tablet 0 Not Taking at Unknown time    HOSPITAL MEDICATIONS: Prior to Admission:  Prescriptions prior to admission  Medication Sig Dispense  Refill Last Dose  . aspirin 81 MG tablet Take 81 mg by mouth daily as needed for pain.   03/24/2015 at Unknown time  . docusate sodium (COLACE) 100 MG capsule Take 100 mg by mouth 2 (two) times daily.   03/24/2015 at Unknown time  . folic acid (FOLVITE) 1 MG tablet Take 1 mg by mouth daily.   03/24/2015 at Unknown time  . HYDROcodone-acetaminophen (NORCO) 10-325 MG per tablet Take 1 tablet by mouth every 6 (six) hours as needed. for pain  0 03/25/2015 at Unknown time  . Methotrexate Sodium (METHOTREXATE, PF,) 50 MG/2ML injection Inject 0.8 mLs as directed once a week. Inject 0.8 mLs as directed once a week.   03/24/2015 at Unknown time  . naproxen sodium (RA  NAPROXEN SODIUM) 220 MG tablet Take 220 mg by mouth daily. Take 1 220 mg tablet daily   03/24/2015 at Unknown time  . ondansetron (ZOFRAN-ODT) 4 MG disintegrating tablet Take 4 mg by mouth 2 (two) times daily.  0 03/25/2015 at Unknown time  . pantoprazole (PROTONIX) 40 MG tablet Take 40 mg by mouth daily.  0 03/24/2015 at Unknown time  . predniSONE (DELTASONE) 10 MG tablet Take 10 mg by mouth daily. Take 1 64m tablet daily   03/24/2015 at Unknown time  . ramipril (ALTACE) 5 MG tablet Take 5 mg by mouth daily before breakfast.   03/24/2015 at Unknown time  . traMADol (ULTRAM) 50 MG tablet Take 50 mg by mouth daily as needed. Take 1 565mtablet daily as needed     . Vitamin D, Ergocalciferol, (DRISDOL) 50000 UNITS CAPS capsule Take 1,000 Units by mouth daily.   03/24/2015 at Unknown time  . methocarbamol (ROBAXIN) 500 MG tablet Take 1 tablet (500 mg total) by mouth every 6 (six) hours as needed. (Patient not taking: Reported on 03/25/2015) 80 tablet 0 Not Taking at Unknown time  . oxyCODONE (OXY IR/ROXICODONE) 5 MG immediate release tablet Take 1-2 tablets (5-10 mg total) by mouth every 3 (three) hours as needed. (Patient not taking: Reported on 03/25/2015) 80 tablet 0 Not Taking at Unknown time  . predniSONE (DELTASONE) 5 MG tablet Take 1 tablet (5 mg total)  by mouth as directed. 3 tabs (1558m this evening 06/13/12.  2 tabs (80m62mtwice a day on 06/14/12.  Then resume the 9 mg dosing daily starting 06/16/11 and take the 5 mg tablet with the 4 mg to equal the 9 mg dosing. (Patient not taking: Reported on 03/25/2015) 50 tablet 0 Not Taking at Unknown time  . rivaroxaban (XARELTO) 10 MG TABS tablet Take 1 tablet (10 mg total) by mouth daily with breakfast. Take Xarelto for two and a half more weeks, then discontinue Xarelto. (Patient not taking: Reported on 03/25/2015) 18 tablet 0 Not Taking at Unknown time    VITALS: Blood pressure 139/91, pulse 102, temperature 98 F (36.7 C), temperature source Oral, resp. rate 18, height 6' 6"  (1.981 m), weight 212 lb 8.4 oz (96.4 kg), SpO2 94 %.  PHYSICAL EXAM: General appearance: alert and no distress Neck: JVD - 3 cm above sternal notch and no carotid bruit Lungs: clear to auscultation bilaterally Heart: regular tachycardia - somewhat distant heart sounds, no murmur Abdomen: soft, non-tender; bowel sounds normal; no masses,  no organomegaly Extremities: cool, pale, diaphoretic Pulses: 1+ pulses Skin: pale, cool, diaphoretic Neurologic: Mental status: Alert and oriented, follows commands Psych: Mildly anxious  LABS: Results for orders placed or performed during the hospital encounter of 03/25/15 (from the past 48 hour(s))  Basic metabolic panel     Status: Abnormal   Collection Time: 03/25/15 10:52 AM  Result Value Ref Range   Sodium 134 (L) 135 - 145 mmol/L   Potassium 4.5 3.5 - 5.1 mmol/L   Chloride 104 101 - 111 mmol/L   CO2 21 (L) 22 - 32 mmol/L   Glucose, Bld 166 (H) 65 - 99 mg/dL   BUN 26 (H) 6 - 20 mg/dL   Creatinine, Ser 1.23 0.61 - 1.24 mg/dL   Calcium 8.7 (L) 8.9 - 10.3 mg/dL   GFR calc non Af Amer >60 >60 mL/min   GFR calc Af Amer >60 >60 mL/min    Comment: (NOTE) The eGFR has been calculated using the CKD EPI equation.  This calculation has not been validated in all clinical  situations. eGFR's persistently <60 mL/min signify possible Chronic Kidney Disease.    Anion gap 9 5 - 15  CBC     Status: Abnormal   Collection Time: 03/25/15 10:52 AM  Result Value Ref Range   WBC 12.3 (H) 4.0 - 10.5 K/uL   RBC 4.51 4.22 - 5.81 MIL/uL   Hemoglobin 14.4 13.0 - 17.0 g/dL   HCT 43.5 39.0 - 52.0 %   MCV 96.5 78.0 - 100.0 fL   MCH 31.9 26.0 - 34.0 pg   MCHC 33.1 30.0 - 36.0 g/dL   RDW 15.1 11.5 - 15.5 %   Platelets 403 (H) 150 - 400 K/uL  I-stat troponin, ED     Status: None   Collection Time: 03/25/15 11:08 AM  Result Value Ref Range   Troponin i, poc 0.01 0.00 - 0.08 ng/mL   Comment 3            Comment: Due to the release kinetics of cTnI, a negative result within the first hours of the onset of symptoms does not rule out myocardial infarction with certainty. If myocardial infarction is still suspected, repeat the test at appropriate intervals.   Urinalysis, Routine w reflex microscopic (not at [facility])     Status: Abnormal   Collection Time: 03/25/15 12:04 PM  Result Value Ref Range   Color, Urine ORANGE (A) YELLOW    Comment: BIOCHEMICALS MAY BE AFFECTED BY COLOR   APPearance CLOUDY (A) CLEAR   Specific Gravity, Urine 1.037 (H) 1.005 - 1.030   pH 5.5 5.0 - 8.0   Glucose, UA 250 (A) NEGATIVE mg/dL   Hgb urine dipstick NEGATIVE NEGATIVE   Bilirubin Urine MODERATE (A) NEGATIVE   Ketones, ur 15 (A) NEGATIVE mg/dL   Protein, ur 30 (A) NEGATIVE mg/dL   Urobilinogen, UA 1.0 0.0 - 1.0 mg/dL   Nitrite POSITIVE (A) NEGATIVE   Leukocytes, UA TRACE (A) NEGATIVE  Urine microscopic-add on     Status: Abnormal   Collection Time: 03/25/15 12:04 PM  Result Value Ref Range   Squamous Epithelial / LPF RARE RARE   WBC, UA 0-2 <3 WBC/hpf   RBC / HPF 0-2 <3 RBC/hpf   Bacteria, UA FEW (A) RARE   Casts HYALINE CASTS (A) NEGATIVE   Urine-Other MUCOUS PRESENT   C-reactive protein     Status: Abnormal   Collection Time: 03/25/15  2:12 PM  Result Value Ref Range   CRP  29.0 (H) <1.0 mg/dL    Comment: Performed at [place]: Dg Chest 2 View  03/25/2015   CLINICAL DATA:  Gradual onset mid chest pain this morning.  EXAM: CHEST  2 VIEW  COMPARISON:  Chest x-ray dated 06/05/2012.  FINDINGS: There is mild cardiomegaly which may be somewhat accentuated by low lung volumes. Ill-defined airspace opacity is seen at the right lung base and there is a denser opacity at the left lung base. Suspect small bilateral pleural effusions, left greater than right. Upper lungs are relatively clear. No pneumothorax. No osseous abnormality seen.  IMPRESSION: 1. Cardiomegaly. 2. Bibasilar airspace opacities, left greater than right. These could be atelectasis or pneumonia. 3. Small bilateral pleural effusions, left greater than right.   Electronically Signed   By: Franki Cabot M.D.   On: 03/25/2015 11:16   Ct Angio Chest Pe W/cm &/or Wo Cm  03/25/2015   CLINICAL DATA:  Central chest pain for 1 week, tachycardia,  hypoxemia, shortness of breath.  EXAM: CT ANGIOGRAPHY CHEST WITH CONTRAST  TECHNIQUE: Multidetector CT imaging of the chest was performed using the standard protocol during bolus administration of intravenous contrast. Multiplanar CT image reconstructions and MIPs were obtained to evaluate the vascular anatomy.  CONTRAST:  117m OMNIPAQUE IOHEXOL 350 MG/ML SOLN  COMPARISON:  None.  FINDINGS: There is no pulmonary embolism identified within the main, lobar, or central segmental pulmonary arteries. Some of the most peripheral segmental and subsegmental pulmonary arteries are difficult to definitively characterize due to mild patient breathing motion artifact.  There is a moderate-sized pericardial effusion, measuring 2 cm greatest thickness. Heart size is upper normal. No masses or enlarged lymph nodes seen within the mediastinum or perihilar regions. Thoracic aorta is normal in caliber. No aortic aneurysm or dissection.  There are bilateral pleural effusions, small to  moderate in size, with adjacent compressive atelectasis. Additional mild atelectasis noted within the right middle lobe and right upper lobe. Small emphysematous blebs at each lung apex. Trachea and central bronchi are unremarkable.  Limited images of the upper abdomen are also provided. There are scattered small hypodense foci within the bilateral liver lobes, incompletely imaged, which are too small to definitively characterize but probably cysts. Mild degenerative changes noted within the thoracic spine but no acute osseous abnormality.  Review of the MIP images confirms the above findings.  IMPRESSION: 1. Pericardial effusion, moderate in size, measuring 2 cm greatest thickness. 2. No pulmonary embolism seen, with mild study limitations detailed above. 3. Bilateral pleural effusions, small to moderate in size (left slightly greater than right), with adjacent compressive atelectasis. 4. Scattered tiny hypodense foci within the bilateral liver lobes, incompletely imaged, too small to characterize but probably benign cysts. 5. Mild biapical emphysematous change. These results were called by telephone at the time of interpretation on 03/25/2015 at 1:07 pm to Dr. SVirgel Manifold, who verbally acknowledged these results.   Electronically Signed   By: SFranki CabotM.D.   On: 03/25/2015 13:09   ECHO: Study Conclusions  - Left ventricle: The cavity size was normal. There was mild concentric hypertrophy. Systolic function was mildly to moderately reduced. The estimated ejection fraction was in the range of 40% to 45%. Diffuse hypokinesis. - Ventricular septum: Septal motion showed paradox. - Aortic valve: There was no regurgitation. - Mitral valve: Structurally normal valve. - Left atrium: The atrium was normal in size. - Right ventricle: Systolic function was normal. - Right atrium: The atrium was normal in size. - Tricuspid valve: There was mild regurgitation. - Pulmonic valve: There was no  regurgitation.  Impressions:  - LVEF is mildly decreased, the assessment might be affected by tachycardia during acquisition.  There is moderate circumferential pericardial effusion (1.0 - 1.6 cm) with signs suggestive of tamponade - respiratory variations in mitral inflow 35%, in tricuspid inflow 38%, limited RV diastolic filling and dilated, non-collapsing IVC.   HOSPITAL DIAGNOSES: Principal Problem:   Pericarditis Active Problems:   Rheumatoid arthritis   SOB (shortness of breath)   Sinus tachycardia   Pericardial effusion   Acute combined systolic and diastolic heart failure   IMPRESSION: 1. Acute pericardial effusion with tamponade features 2. Acute systolic heart failure with a cardiomyopathy and EF which is moderate to severely reduced 3. Progressive shortness of breath and tachycardia  RECOMMENDATION: 1. Mr. AHarigis presenting with 5 days of progressive chest pain in the interval development of a pericardial effusion which is now moderate demonstrating signs of tamponade physiology on echo.  In addition there is a cardiomyopathy. His EF was read as mildly decreased however my review of the echo suggested his EF is actually moderate to severely reduced perhaps in the 30-35% range. Physical exam also confirms poor cardiac output and although blood pressure is within normal limits, he has cool extremities and is pale and diaphoretic. I suspect this is due to low cardiac output which could be due to his cardiomyopathy or tamponade features. CRP is elevated which is difficult to place as to whether it is due to RA, inflammatory pericarditis and/or possible myocarditis. His troponin initially however is negative, suggesting against myocarditis. The RV appears thickened on echo and highly echogenic. I discussed the case with Dr. Ellyn Hack who is the interventional cardiologist on-call and Dr. Cyndia Bent who is the cardiac surgeon on-call. At this time we will proceed with right  heart catheterization to determine whether there are features of tamponade physiology and/or low cardiac output, this may drive treatment either toward pericardiocentesis and/or optimization of cardiac output. Will move to CCU - discussed with [facility] on call, I will assume care on my service.  Time Spent Directly with Patient: 45 minutes  Pixie Casino, MD, [facility] Attending Cardiologist [place] 03/25/2015, 8:02 PM

## 2015-03-25 NOTE — ED Provider Notes (Signed)
CSN: [id number]     Arrival date & time 03/25/15  1034 History   First MD Initiated Contact with Patient 03/25/15 1046     Chief Complaint  Patient presents with  . Chest Pain     (Consider location/radiation/quality/duration/timing/severity/associated sxs/prior Treatment) HPI   64yM with epigastric pain. Had episode this past Sunday while watching [place] [employer]. Intense crescendoing pain in epigastric area. Did not radiate. No associated symptoms. Seen at [facility] at that time. He reports that w/u including cardiac enzymes x2 and CT of a/p which did not reveal obvious pathology.  Felt symptoms may be from GERD. Started on protonix. Symptoms improved prior to leaving ED then and resolved completely by next morning. Felt fine until yesterday while at work. Very similar symptoms but now also noticing worse with deep inspiration. Pain has been constant since returned yesterday. No cough. No fever or chills. Denies SOB at rest but has been winded with only light activity. No known CAD. Reports distant history of DVT ~30 years ago related to orthopedic (knee) inury? Not anticoagulated.   Past Medical History  Diagnosis Date  . Hypertension   . Diabetes mellitus   . Arthritis   . Ruptured lumbar disc     L5-S1   x 2 disc  . Kidney stones   . Foot drop, left   . Osteomyelitis 2010    left foot  . Osteomyelitis 2008/2010    left  foot- metatarsal- per patient was MRSA   Past Surgical History  Procedure Laterality Date  . Foot surgery  2008/2010    x 2  left foot/ osteomyelitis  . Achilles tendon surgery      left  . Eye surgery      cataract ectraction with IOL-  bilateral  . Total hip arthroplasty  06/11/2012    Procedure: TOTAL HIP ARTHROPLASTY;  Surgeon: Gearlean Alf, MD;  Location: [facility];  Service: Orthopedics;  Laterality: Left;   No family history on file. History  Substance Use Topics  . Smoking status: Former Smoker -- 1.00 packs/day for 25 years     Quit date: 06/05/2006  . Smokeless tobacco: Never Used  . Alcohol Use: Yes     Comment: socially    Review of Systems  All systems reviewed and negative, other than as noted in HPI.   Allergies  Sulfa drugs cross reactors and Tape  Home Medications   Prior to Admission medications   Medication Sig Start Date End Date Taking? Authorizing Provider  predniSONE (DELTASONE) 5 MG tablet Take 1 tablet (5 mg total) by mouth as directed. 3 tabs (15mg )  this evening 06/13/12.  2 tabs (10mg )  twice a day on 06/14/12.  Then resume the 9 mg dosing daily starting 06/16/11 and take the 5 mg tablet with the 4 mg to equal the 9 mg dosing. Patient taking differently: Take 10 mg by mouth as directed. Ongoing regimen. There is not an end date. Pt reports that he is on the 10 mg 06/13/12  Yes Arlee Muslim, PA-C  ramipril (ALTACE) 5 MG tablet Take 5 mg by mouth daily before breakfast.   Yes Historical Provider, MD  methocarbamol (ROBAXIN) 500 MG tablet Take 1 tablet (500 mg total) by mouth every 6 (six) hours as needed. Patient not taking: Reported on 03/25/2015 06/13/12   Arlee Muslim, PA-C  oxyCODONE (OXY IR/ROXICODONE) 5 MG immediate release tablet Take 1-2 tablets (5-10 mg total) by mouth every 3 (three) hours as needed. Patient  not taking: Reported on 03/25/2015 06/13/12   Arlee Muslim, PA-C  rivaroxaban (XARELTO) 10 MG TABS tablet Take 1 tablet (10 mg total) by mouth daily with breakfast. Take Xarelto for two and a half more weeks, then discontinue Xarelto. Patient not taking: Reported on 03/25/2015 06/13/12   Arlee Muslim, PA-C   BP 161/95 mmHg  Pulse 122  Temp(Src) 97.4 F (36.3 C) (Oral)  Resp 18  Ht 6\' 6"  (1.981 m)  Wt 219 lb (99.338 kg)  BMI 25.31 kg/m2  SpO2 92% Physical Exam  Constitutional: He appears well-developed and well-nourished. No distress.  HENT:  Head: Normocephalic and atraumatic.  Eyes: Conjunctivae are normal. Right eye exhibits no discharge. Left eye exhibits no discharge.   Neck: Neck supple.  Cardiovascular: Regular rhythm and normal heart sounds.  Exam reveals no gallop and no friction rub.   No murmur heard. tachycardia  Pulmonary/Chest: Effort normal and breath sounds normal. No respiratory distress.  Abdominal: Soft. He exhibits no distension. There is no tenderness.  Musculoskeletal: He exhibits no edema or tenderness.  Mild RLE swelling as compared to L. No calf tenderness. Neg homan's.   Neurological: He is alert.  Skin: Skin is warm and dry.  Psychiatric: He has a normal mood and affect. His behavior is normal. Thought content normal.  Nursing note and vitals reviewed.   ED Course  Procedures (including critical care time) Labs Review Labs Reviewed  BASIC METABOLIC PANEL - Abnormal; Notable for the following:    Sodium 134 (*)    CO2 21 (*)    Glucose, Bld 166 (*)    BUN 26 (*)    Calcium 8.7 (*)    All other components within normal limits  CBC - Abnormal; Notable for the following:    WBC 12.3 (*)    Platelets 403 (*)    All other components within normal limits  URINALYSIS, ROUTINE W REFLEX MICROSCOPIC (NOT AT [facility]) - Abnormal; Notable for the following:    Color, Urine ORANGE (*)    APPearance CLOUDY (*)    Specific Gravity, Urine 1.037 (*)    Glucose, UA 250 (*)    Bilirubin Urine MODERATE (*)    Ketones, ur 15 (*)    Protein, ur 30 (*)    Nitrite POSITIVE (*)    Leukocytes, UA TRACE (*)    All other components within normal limits  URINE MICROSCOPIC-ADD ON - Abnormal; Notable for the following:    Bacteria, UA FEW (*)    Casts HYALINE CASTS (*)    All other components within normal limits  URINE CULTURE  I-STAT TROPOININ, ED    Imaging Review Dg Chest 2 View  03/25/2015   CLINICAL DATA:  Gradual onset mid chest pain this morning.  EXAM: CHEST  2 VIEW  COMPARISON:  Chest x-ray dated 06/05/2012.  FINDINGS: There is mild cardiomegaly which may be somewhat accentuated by low lung volumes. Ill-defined airspace opacity is  seen at the right lung base and there is a denser opacity at the left lung base. Suspect small bilateral pleural effusions, left greater than right. Upper lungs are relatively clear. No pneumothorax. No osseous abnormality seen.  IMPRESSION: 1. Cardiomegaly. 2. Bibasilar airspace opacities, left greater than right. These could be atelectasis or pneumonia. 3. Small bilateral pleural effusions, left greater than right.   Electronically Signed   By: Franki Cabot M.D.   On: 03/25/2015 11:16   Ct Angio Chest Pe W/cm &/or Wo Cm  03/25/2015   CLINICAL DATA:  Central chest pain for 1 week, tachycardia, hypoxemia, shortness of breath.  EXAM: CT ANGIOGRAPHY CHEST WITH CONTRAST  TECHNIQUE: Multidetector CT imaging of the chest was performed using the standard protocol during bolus administration of intravenous contrast. Multiplanar CT image reconstructions and MIPs were obtained to evaluate the vascular anatomy.  CONTRAST:  177mL OMNIPAQUE IOHEXOL 350 MG/ML SOLN  COMPARISON:  None.  FINDINGS: There is no pulmonary embolism identified within the main, lobar, or central segmental pulmonary arteries. Some of the most peripheral segmental and subsegmental pulmonary arteries are difficult to definitively characterize due to mild patient breathing motion artifact.  There is a moderate-sized pericardial effusion, measuring 2 cm greatest thickness. Heart size is upper normal. No masses or enlarged lymph nodes seen within the mediastinum or perihilar regions. Thoracic aorta is normal in caliber. No aortic aneurysm or dissection.  There are bilateral pleural effusions, small to moderate in size, with adjacent compressive atelectasis. Additional mild atelectasis noted within the right middle lobe and right upper lobe. Small emphysematous blebs at each lung apex. Trachea and central bronchi are unremarkable.  Limited images of the upper abdomen are also provided. There are scattered small hypodense foci within the bilateral liver  lobes, incompletely imaged, which are too small to definitively characterize but probably cysts. Mild degenerative changes noted within the thoracic spine but no acute osseous abnormality.  Review of the MIP images confirms the above findings.  IMPRESSION: 1. Pericardial effusion, moderate in size, measuring 2 cm greatest thickness. 2. No pulmonary embolism seen, with mild study limitations detailed above. 3. Bilateral pleural effusions, small to moderate in size (left slightly greater than right), with adjacent compressive atelectasis. 4. Scattered tiny hypodense foci within the bilateral liver lobes, incompletely imaged, too small to characterize but probably benign cysts. 5. Mild biapical emphysematous change. These results were called by telephone at the time of interpretation on 03/25/2015 at 1:07 pm to Dr. Virgel Manifold , who verbally acknowledged these results.   Electronically Signed   By: Franki Cabot M.D.   On: 03/25/2015 13:09     EKG Interpretation   Date/Time:  Friday March 25 2015 10:43:34 EDT Ventricular Rate:  123 PR Interval:  142 QRS Duration: 140 QT Interval:  354 QTC Calculation: 506 R Axis:   5 Text Interpretation:  Sinus tachycardia Left bundle branch block Confirmed  by Glendoris Nodarse  MD, Rosell Khouri ([id number]) on 03/25/2015 10:47:38 AM      MDM   Final diagnoses:  Pericarditis  Pericardial effusion    64yM with epigastric pain. Abdominal exam is benign. Consider ACS, but atypical features. EKG with LBBB which is not new. Normal troponin with constant symptoms since yesterday. Some concern for possible PE with pleuritic nature. Also resting sinus tachycardia to 120s and hypoxemia to low 90s on room air. No fever. R leg slightly larger on exam, but pt reports this is chronic. Distant history of DVT. CXR today with b/l basilar opacities/effusions. This may potentially be pneumonia. Does have leukocytosis but also chronically on prednisone.  This and methotrexate for RA would make him more  predisposed for infection though.  Would explain pleuritic component and low O2 sats.  I still think he needs CT angio to r/o PE.   CT w/o evidence of PE. However, he does have moderate sized pericardial effusion and pleural effusions. Not classic symptoms for pericarditis, but wouldn't be surprising in someone with history of rheumatoid. Pleural effusions may be sympathetic from pericarditis and hypoxemia from resulting compressive atelectasis? Will start on higher  dose of prednisone. NSAIDs. Cholchicine. Decent pericardial effusion, but he is normo-to-hypertensive and denies pre-syncopal symptoms.    1:41 PM Records reviewed from [facility]. Pt actually had CT chest, abdomen and pelvis with contrast on 7/17.   "Heart: normal in size and without pericardial effusion."   "Lungs: No pleural effusion, focal consolidation, or pneumothorax. No suspicious pulmonary nodules. There is mild subsegmental atelectasis within the lower lobes with volume loss."  With radiographic progression of findings within just a few days and tachycardia/mild hypoxemia, will admit.    2:07 PM Discussed with Dr Wyline Copas, medicine for admission. Will admit to [facility] in case patient would end up needing any intervention. Discussed with Wannetta Sender about getting ECHO.      Virgel Manifold, MD 03/25/15 1430

## 2015-03-25 NOTE — Interval H&P Note (Signed)
History and Physical Interval Note:  03/25/2015 9:09 PM  Ethan Alexander  has presented today for surgery, with the diagnosis of cardiomyopathy with pericardial effusion concerning for possible tamponade physiology. Not currently hemodynamic compromised.   I was asked to assist with managing this patient. After long discussion with Dr. Debara Pickett, we decided that prior to consideration of/urgent pericardiocentesis versus low, we would like to have some hemodynamic monitoring to ensure the patient is not in the potential for hemodynamic collapse. He is currently maintaining stable blood pressure with mild tachycardia. Concerning issues are that he has a bundle branch block, an introducing the risk for complete heart block with right heart catheterization. We did discuss this risk as well. CT scan evaluation deathly shows some type of abnormal follow to the RV. Visualization of the pericardial effusion does not appear to be favorable for non-emergent pericardiocentesis.  For now the plan is to a right heart catheterization in order to monitor his hemodynamic condition and determine if there is hemodynamic evidence of tamponade and allow for hemodynamic monitoring overnight.  The various methods of treatment have been discussed with the patient and family. After consideration of risks, benefits and other options for treatment, the patient has consented to    Right Heart Catheterization as a surgical intervention .  The patient's history has been reviewed, patient examined, no change in status, stable for surgery.  I have reviewed the patient's chart and labs.  Questions were answered to the patient's satisfaction.     HARDING, DAVID W

## 2015-03-25 NOTE — Care Management Note (Addendum)
Case Management Note  Patient Details  Name: ANIBAL QUINBY MRN: [id number] Date of Birth: Oct 19, 1950  Subjective/Objective:  Pt admitted for cp and SOB.                   Action/Plan: CM will continue to monitor for disposition needs.    Expected Discharge Date:                  Expected Discharge Plan:  Home/Self Care  In-House Referral:     Discharge planning Services  CM Consult  Post Acute Care Choice:    Choice offered to:     DME Arranged:    DME Agency:     HH Arranged:    HH Agency:     Status of Service:  In process, will continue to follow  Medicare Important Message Given:    Date Medicare IM Given:    Medicare IM give by:    Date Additional Medicare IM Given:    Additional Medicare Important Message give by:     If discussed at [place] of Stay Meetings, dates discussed:    Additional Comments:  Bethena Roys, RN 03/25/2015, 4:18 PM

## 2015-03-25 NOTE — Progress Notes (Signed)
Pt on call to cath lab. VS obtained. Pt voided. Assisted on to stretcher. Pt belongings given to wife.

## 2015-03-25 NOTE — Progress Notes (Signed)
[facility]Link Physician-Brief Progress Note Patient Name: Ethan Alexander DOB: [birth year] MRN: [id number]   Date of Service  03/25/2015  HPI/Events of Note  44 M with pericarditis and acute systolic heart failure to cath lab for evaluation of pericardial effusion.  Now HD stable in no resp distress.  PMH is significant for HTN/RA/DM  eICU Interventions  Plan of care per primary cardiology service. Continue to monitor via [facility]     Intervention Category Evaluation Type: New Patient Evaluation  [name] 03/25/2015, 11:32 PM

## 2015-03-25 NOTE — ED Notes (Signed)
[facility] called. 

## 2015-03-25 NOTE — ED Notes (Signed)
Pt transported to DG.  

## 2015-03-25 NOTE — H&P (Signed)
[facility] History and Physical  AZAI GAFFIN OEV:[id number] DOB: Oct 05, 1950 DOA: 03/25/2015  Referring physician: Emergency Department PCP: Thressa Sheller, MD  Specialists:   Chief Complaint: Chest pain, sob  HPI: Ethan Alexander is a 64 y.o. male  With hx of HTN, RA who presented to ED 5 days prior with complaints of chest pains. Work up was largely unrevealing and pt was discharged home. Pt returns to ED with sob and sinus tach, underwent CTA chest which was neg for PE but notable for moderate sized pericardial effusion. In ED, patient remained normotensive. Patient was given dose of colchicine, Cardiology consulted, and hospitalist consulted for admission.  Review of Systems:  Review of Systems  Constitutional: Negative for fever and chills.  HENT: Negative for ear pain, sore throat and tinnitus.   Eyes: Negative for pain and discharge.  Respiratory: Positive for shortness of breath. Negative for wheezing.   Cardiovascular: Positive for chest pain and palpitations. Negative for orthopnea.  Gastrointestinal: Negative for nausea, vomiting and abdominal pain.  Genitourinary: Negative for urgency and flank pain.  Musculoskeletal: Negative for falls and neck pain.  Neurological: Negative for seizures and loss of consciousness.  Psychiatric/Behavioral: Negative for hallucinations and memory loss.     Past Medical History  Diagnosis Date  . Hypertension   . Arthritis   . Ruptured lumbar disc     L5-S1   x 2 disc  . Kidney stones   . Foot drop, left   . Osteomyelitis 2010    left foot  . Osteomyelitis 2008/2010    left  foot- metatarsal- per patient was MRSA  . Diabetes mellitus     diet controlled   Past Surgical History  Procedure Laterality Date  . Foot surgery  2008/2010    x 2  left foot/ osteomyelitis  . Achilles tendon surgery      left  . Eye surgery      cataract ectraction with IOL-  bilateral  . Total hip arthroplasty  06/11/2012    Procedure:  TOTAL HIP ARTHROPLASTY;  Surgeon: Gearlean Alf, MD;  Location: [facility];  Service: Orthopedics;  Laterality: Left;   Social History:  reports that he quit smoking about 8 years ago. He has never used smokeless tobacco. He reports that he drinks alcohol. He reports that he does not use illicit drugs.  where does patient live--home, ALF, SNF? and with whom if at home?  Can patient participate in ADLs?  Allergies  Allergen Reactions  . Sulfa Drugs Cross Reactors Other (See Comments)    Flu like symptoms   . Tape     Tears his skin    No family history on file. Mother osteoarthritis, osteoporosis // Father heart disease (be sure to complete)  Prior to Admission medications   Medication Sig Start Date End Date Taking? Authorizing Provider  aspirin 81 MG tablet Take 81 mg by mouth daily as needed for pain.   Yes Historical Provider, MD  docusate sodium (COLACE) 100 MG capsule Take 100 mg by mouth 2 (two) times daily.   Yes Historical Provider, MD  folic acid (FOLVITE) 1 MG tablet Take 1 mg by mouth daily.   Yes Historical Provider, MD  HYDROcodone-acetaminophen (NORCO) 10-325 MG per tablet Take 1 tablet by mouth every 6 (six) hours as needed. for pain 03/20/15  Yes Historical Provider, MD  Methotrexate Sodium (METHOTREXATE, PF,) 50 MG/2ML injection Inject 0.8 mLs as directed once a week. Inject 0.8 mLs as directed once a week.  Yes Historical Provider, MD  naproxen sodium (RA NAPROXEN SODIUM) 220 MG tablet Take 220 mg by mouth daily. Take 1 220 mg tablet daily   Yes Historical Provider, MD  ondansetron (ZOFRAN-ODT) 4 MG disintegrating tablet Take 4 mg by mouth 2 (two) times daily. 03/21/15  Yes Historical Provider, MD  pantoprazole (PROTONIX) 40 MG tablet Take 40 mg by mouth daily. 03/20/15  Yes Historical Provider, MD  predniSONE (DELTASONE) 10 MG tablet Take 10 mg by mouth daily. Take 1 10mg  tablet daily 05/09/14  Yes Historical Provider, MD  ramipril (ALTACE) 5 MG tablet Take 5 mg by mouth  daily before breakfast.   Yes Historical Provider, MD  traMADol (ULTRAM) 50 MG tablet Take 50 mg by mouth daily as needed. Take 1 50mg  tablet daily as needed 10/25/14  Yes Historical Provider, MD  Vitamin D, Ergocalciferol, (DRISDOL) 50000 UNITS CAPS capsule Take 1,000 Units by mouth daily.   Yes Historical Provider, MD  methocarbamol (ROBAXIN) 500 MG tablet Take 1 tablet (500 mg total) by mouth every 6 (six) hours as needed. Patient not taking: Reported on 03/25/2015 06/13/12   Arlee Muslim, PA-C  oxyCODONE (OXY IR/ROXICODONE) 5 MG immediate release tablet Take 1-2 tablets (5-10 mg total) by mouth every 3 (three) hours as needed. Patient not taking: Reported on 03/25/2015 06/13/12   Arlee Muslim, PA-C  predniSONE (DELTASONE) 5 MG tablet Take 1 tablet (5 mg total) by mouth as directed. 3 tabs (15mg )  this evening 06/13/12.  2 tabs (10mg )  twice a day on 06/14/12.  Then resume the 9 mg dosing daily starting 06/16/11 and take the 5 mg tablet with the 4 mg to equal the 9 mg dosing. Patient not taking: Reported on 03/25/2015 06/13/12   Arlee Muslim, PA-C  rivaroxaban (XARELTO) 10 MG TABS tablet Take 1 tablet (10 mg total) by mouth daily with breakfast. Take Xarelto for two and a half more weeks, then discontinue Xarelto. Patient not taking: Reported on 03/25/2015 06/13/12   Arlee Muslim, PA-C   Physical Exam: Filed Vitals:   03/25/15 1137 03/25/15 1237 03/25/15 1314 03/25/15 1349  BP: 132/91 123/93 134/96 144/94  Pulse: 117 109 112 116  Temp:      TempSrc:      Resp: 20 16 20 22   Height:      Weight:      SpO2: 92% 92% 92% 92%     General:  Awake, in nad  Eyes: PERRL B  ENT: membranes moist, dentition fair  Neck: trachea midline, neck supple, elevated JVD on L  Cardiovascular: regular, tachycardic, s1, s2  Respiratory: normal resp effort, no wheezing  Abdomen: soft,nondistended  Skin: normal skin turgor, no abnormal skin lesions seen  Musculoskeletal: perfused, no  clubbing  Psychiatric: mood/affect normal// no auditory/visual hallucinations  Neurologic: cn2-12 grossly intact, strength/sensation intact  Labs on Admission:  Basic Metabolic Panel:  Recent Labs Lab 03/25/15 1052  NA 134*  K 4.5  CL 104  CO2 21*  GLUCOSE 166*  BUN 26*  CREATININE 1.23  CALCIUM 8.7*   Liver Function Tests: No results for input(s): AST, ALT, ALKPHOS, BILITOT, PROT, ALBUMIN in the last 168 hours. No results for input(s): LIPASE, AMYLASE in the last 168 hours. No results for input(s): AMMONIA in the last 168 hours. CBC:  Recent Labs Lab 03/25/15 1052  WBC 12.3*  HGB 14.4  HCT 43.5  MCV 96.5  PLT 403*   Cardiac Enzymes: No results for input(s): CKTOTAL, CKMB, CKMBINDEX, TROPONINI in the last 168 hours.  BNP (last 3 results) No results for input(s): BNP in the last 8760 hours.  ProBNP (last 3 results) No results for input(s): PROBNP in the last 8760 hours.  CBG: No results for input(s): GLUCAP in the last 168 hours.  Radiological Exams on Admission: Dg Chest 2 View  03/25/2015   CLINICAL DATA:  Gradual onset mid chest pain this morning.  EXAM: CHEST  2 VIEW  COMPARISON:  Chest x-ray dated 06/05/2012.  FINDINGS: There is mild cardiomegaly which may be somewhat accentuated by low lung volumes. Ill-defined airspace opacity is seen at the right lung base and there is a denser opacity at the left lung base. Suspect small bilateral pleural effusions, left greater than right. Upper lungs are relatively clear. No pneumothorax. No osseous abnormality seen.  IMPRESSION: 1. Cardiomegaly. 2. Bibasilar airspace opacities, left greater than right. These could be atelectasis or pneumonia. 3. Small bilateral pleural effusions, left greater than right.   Electronically Signed   By: Franki Cabot M.D.   On: 03/25/2015 11:16   Ct Angio Chest Pe W/cm &/or Wo Cm  03/25/2015   CLINICAL DATA:  Central chest pain for 1 week, tachycardia, hypoxemia, shortness of breath.   EXAM: CT ANGIOGRAPHY CHEST WITH CONTRAST  TECHNIQUE: Multidetector CT imaging of the chest was performed using the standard protocol during bolus administration of intravenous contrast. Multiplanar CT image reconstructions and MIPs were obtained to evaluate the vascular anatomy.  CONTRAST:  144mL OMNIPAQUE IOHEXOL 350 MG/ML SOLN  COMPARISON:  None.  FINDINGS: There is no pulmonary embolism identified within the main, lobar, or central segmental pulmonary arteries. Some of the most peripheral segmental and subsegmental pulmonary arteries are difficult to definitively characterize due to mild patient breathing motion artifact.  There is a moderate-sized pericardial effusion, measuring 2 cm greatest thickness. Heart size is upper normal. No masses or enlarged lymph nodes seen within the mediastinum or perihilar regions. Thoracic aorta is normal in caliber. No aortic aneurysm or dissection.  There are bilateral pleural effusions, small to moderate in size, with adjacent compressive atelectasis. Additional mild atelectasis noted within the right middle lobe and right upper lobe. Small emphysematous blebs at each lung apex. Trachea and central bronchi are unremarkable.  Limited images of the upper abdomen are also provided. There are scattered small hypodense foci within the bilateral liver lobes, incompletely imaged, which are too small to definitively characterize but probably cysts. Mild degenerative changes noted within the thoracic spine but no acute osseous abnormality.  Review of the MIP images confirms the above findings.  IMPRESSION: 1. Pericardial effusion, moderate in size, measuring 2 cm greatest thickness. 2. No pulmonary embolism seen, with mild study limitations detailed above. 3. Bilateral pleural effusions, small to moderate in size (left slightly greater than right), with adjacent compressive atelectasis. 4. Scattered tiny hypodense foci within the bilateral liver lobes, incompletely imaged, too small to  characterize but probably benign cysts. 5. Mild biapical emphysematous change. These results were called by telephone at the time of interpretation on 03/25/2015 at 1:07 pm to Dr. Virgel Manifold , who verbally acknowledged these results.   Electronically Signed   By: Franki Cabot M.D.   On: 03/25/2015 13:09   Assessment/Plan Principal Problem:   Pericarditis Active Problems:   Rheumatoid arthritis   SOB (shortness of breath)   Sinus tachycardia   1. Moderate pericardial effusion 1. Suspect secondary to RA related pericarditis per below 2. Cardiology was consulted through ED 3. No evidence on acute tamponade at this time, pending  results of 2d echo done in ED 4. Presently hemodynamically stable 5. Will admit to med-tele at [facility] 2. Likely Pericarditis 1. Symptoms leading up to visit today suggestive of acute pericarditis 2. Agree with colchicine, and will cont scheduled BID dosing for now 3. No fevers 3. RA 1. On weekly methotrexate 2. Will cont prednisone 4. SOB 1. On min O2 support 2. Cont to monitor 5. Sinus tach 1. Likely secondary to pericardial effusion 2. Cont monitor for now 3. Stable 6. DVT prophylaxis 1. SCD's  Code Status: Full  Family Communication: Pt in room, family at bedside Disposition Plan: Admit to [facility] [place] [place] [place] [facility] Pager [phone]  If 7PM-7AM, please contact night-coverage [email or web address] Password [facility] 03/25/2015, 2:02 PM

## 2015-03-25 NOTE — ED Notes (Signed)
Pt complaint of continued central chest burning post diagnosis of GERD on Sunday; symptoms of SOB, nausea, and vomiting.

## 2015-03-25 NOTE — ED Notes (Signed)
MD Wilson Singer ok for patient to have PO liquids.

## 2015-03-25 NOTE — ED Notes (Signed)
Echo in process 

## 2015-03-25 NOTE — Progress Notes (Signed)
UR Completed Deyonte Cadden Graves-Bigelow, RN,BSN [phone]  

## 2015-03-25 NOTE — Progress Notes (Signed)
  Echocardiogram 2D Echocardiogram has been performed.  Darlina Sicilian M 03/25/2015, 2:57 PM

## 2015-03-25 NOTE — ED Notes (Signed)
Kohut at bedside.

## 2015-03-26 LAB — URINALYSIS, ROUTINE W REFLEX MICROSCOPIC
BILIRUBIN URINE: NEGATIVE
Glucose, UA: 100 mg/dL — AB
HGB URINE DIPSTICK: NEGATIVE
Ketones, ur: 15 mg/dL — AB
Leukocytes, UA: NEGATIVE
Nitrite: NEGATIVE
Protein, ur: NEGATIVE mg/dL
SPECIFIC GRAVITY, URINE: 1.013 (ref 1.005–1.030)
Urobilinogen, UA: 0.2 mg/dL (ref 0.0–1.0)
pH: 5.5 (ref 5.0–8.0)

## 2015-03-26 LAB — COMPREHENSIVE METABOLIC PANEL
ALT: 15 U/L — AB (ref 17–63)
AST: 15 U/L (ref 15–41)
Albumin: 2.3 g/dL — ABNORMAL LOW (ref 3.5–5.0)
Alkaline Phosphatase: 54 U/L (ref 38–126)
Anion gap: 10 (ref 5–15)
BUN: 19 mg/dL (ref 6–20)
CALCIUM: 8.2 mg/dL — AB (ref 8.9–10.3)
CO2: 21 mmol/L — ABNORMAL LOW (ref 22–32)
CREATININE: 1.03 mg/dL (ref 0.61–1.24)
Chloride: 100 mmol/L — ABNORMAL LOW (ref 101–111)
GFR calc Af Amer: >60 mL/min (ref >60)
GFR calc non Af Amer: >60 mL/min (ref >60)
Glucose, Bld: 245 mg/dL — ABNORMAL HIGH (ref 65–99)
Potassium: 4.4 mmol/L (ref 3.5–5.1)
Sodium: 131 mmol/L — ABNORMAL LOW (ref 135–145)
Total Bilirubin: 0.9 mg/dL (ref 0.3–1.2)
Total Protein: 6.3 g/dL — ABNORMAL LOW (ref 6.5–8.1)

## 2015-03-26 LAB — CBC
HCT: 39.6 % (ref 39.0–52.0)
Hemoglobin: 12.9 g/dL — ABNORMAL LOW (ref 13.0–17.0)
MCH: 31.2 pg (ref 26.0–34.0)
MCHC: 32.6 g/dL (ref 30.0–36.0)
MCV: 95.9 fL (ref 78.0–100.0)
Platelets: 321 10*3/uL (ref 150–400)
RBC: 4.13 MIL/uL — AB (ref 4.22–5.81)
RDW: 15 % (ref 11.5–15.5)
WBC: 8 10*3/uL (ref 4.0–10.5)

## 2015-03-26 LAB — URINE CULTURE
Culture: NO GROWTH
Special Requests: NORMAL

## 2015-03-26 LAB — POCT I-STAT 3, VENOUS BLOOD GAS (G3P V)
Acid-base deficit: 5 mmol/L — ABNORMAL HIGH (ref 0.0–2.0)
BICARBONATE: 20 meq/L (ref 20.0–24.0)
O2 SAT: 59 %
PCO2 VEN: 38.2 mmHg — AB (ref 45.0–50.0)
PH VEN: 7.327 — AB (ref 7.250–7.300)
TCO2: 21 mmol/L (ref 0–100)
pO2, Ven: 33 mmHg (ref 30.0–45.0)

## 2015-03-26 LAB — MRSA PCR SCREENING: MRSA BY PCR: NEGATIVE

## 2015-03-26 MED ORDER — METHYLPREDNISOLONE SODIUM SUCC 125 MG IJ SOLR
125.0000 mg | Freq: Four times a day (QID) | INTRAMUSCULAR | Status: AC
  Administered 2015-03-26 – 2015-03-27 (×4): 125 mg via INTRAVENOUS
  Filled 2015-03-26 (×4): qty 2

## 2015-03-26 MED ORDER — SODIUM CHLORIDE 0.9 % IV SOLN
INTRAVENOUS | Status: DC
  Administered 2015-03-26: 1000 mL via INTRAVENOUS
  Administered 2015-03-26: 500 mL via INTRAVENOUS

## 2015-03-26 MED ORDER — TUBERCULIN PPD 5 UNIT/0.1ML ID SOLN
5.0000 [IU] | Freq: Once | INTRADERMAL | Status: AC
  Administered 2015-03-26: 5 [IU] via INTRADERMAL
  Filled 2015-03-26: qty 0.1

## 2015-03-26 NOTE — Progress Notes (Signed)
Subjective:   64 y/o with RA (on chronic prednisone - followed by Dr. Ouida Sills), DM2, LBBB admitted with CP and found to have moderate pericardial effusion, with thickened RV free wall and EF 30%. CT negative for PE.   Taken to cath lab on 7/22 for RHC.  No evidence of tamponade RA 9 RV 33/6 (21) PCWP 9 Thermo 5.8/2.5 Fick 4.2/1.8  Started on colchicine and naproxen.   Feels good this am. Denies recent RA flare. Denies CP. Says he has only had 2 episodes of CP this week. No fevers.   Swan numbers this am.  RA 7 PA 32/14 (21) PCWP 12 Thermo 6.4/2.8   Intake/Output Summary (Last 24 hours) at 03/26/15 1003 Last data filed at 03/26/15 0914  Gross per 24 hour  Intake    703 ml  Output   1475 ml  Net   -772 ml    Current meds: . cholecalciferol  1,000 Units Oral Daily  . colchicine  0.6 mg Oral BID  . docusate sodium  100 mg Oral BID  . folic acid  1 mg Oral Daily  . heparin  5,000 Units Subcutaneous 3 times per day  . naproxen sodium  275 mg Oral Daily  . ondansetron  4 mg Oral BID  . pantoprazole  40 mg Oral Daily  . predniSONE  10 mg Oral Daily  . sodium chloride  3 mL Intravenous Q12H  . sodium chloride  3 mL Intravenous Q12H   Infusions: . sodium chloride 250 mL (03/25/15 2230)  . sodium chloride 1,000 mL (03/26/15 0900)     Objective:  Blood pressure 149/85, pulse 99, temperature 100 F (37.8 C), temperature source Core (Comment), resp. rate 18, height 6\' 6"  (1.981 m), weight 95.8 kg (211 lb 3.2 oz), SpO2 96 %. Weight change:   Physical Exam: General:  Well appearing. No resp difficulty HEENT: normal Neck: supple.  JRIJ swan Carotids 2+ bilat; no bruits. No lymphadenopathy or thryomegaly appreciated. Cor: PMI nondisplaced. Tachy regular No rubs, gallops or murmurs. Lungs: clear Abdomen: soft, nontender, nondistended. No hepatosplenomegaly. No bruits or masses. Good bowel sounds. Extremities: no cyanosis, clubbing, rash, edema no rash. + synovial  thickening Neuro: alert & orientedx3, cranial nerves grossly intact. moves all 4 extremities w/o difficulty. Affect pleasant  Telemetry: Sinus tach 100-110  Lab Results: Basic Metabolic Panel:  Recent Labs Lab 03/25/15 1052 03/26/15  NA 134* 131*  K 4.5 4.4  CL 104 100*  CO2 21* 21*  GLUCOSE 166* 245*  BUN 26* 19  CREATININE 1.23 1.03  CALCIUM 8.7* 8.2*   Liver Function Tests:  Recent Labs Lab 03/26/15  AST 15  ALT 15*  ALKPHOS 54  BILITOT 0.9  PROT 6.3*  ALBUMIN 2.3*   No results for input(s): LIPASE, AMYLASE in the last 168 hours. No results for input(s): AMMONIA in the last 168 hours. CBC:  Recent Labs Lab 03/25/15 1052 03/26/15  WBC 12.3* 8.0  HGB 14.4 12.9*  HCT 43.5 39.6  MCV 96.5 95.9  PLT 403* 321   Cardiac Enzymes: No results for input(s): CKTOTAL, CKMB, CKMBINDEX, TROPONINI in the last 168 hours. BNP: Invalid input(s): POCBNP CBG: No results for input(s): GLUCAP in the last 168 hours. Microbiology: No results found for: CULT No results for input(s): CULT, SDES in the last 168 hours.  Imaging: Dg Chest 2 View  03/25/2015   CLINICAL DATA:  Gradual onset mid chest pain this morning.  EXAM: CHEST  2 VIEW  COMPARISON:  Chest  x-ray dated 06/05/2012.  FINDINGS: There is mild cardiomegaly which may be somewhat accentuated by low lung volumes. Ill-defined airspace opacity is seen at the right lung base and there is a denser opacity at the left lung base. Suspect small bilateral pleural effusions, left greater than right. Upper lungs are relatively clear. No pneumothorax. No osseous abnormality seen.  IMPRESSION: 1. Cardiomegaly. 2. Bibasilar airspace opacities, left greater than right. These could be atelectasis or pneumonia. 3. Small bilateral pleural effusions, left greater than right.   Electronically Signed   By: Franki Cabot M.D.   On: 03/25/2015 11:16   Ct Angio Chest Pe W/cm &/or Wo Cm  03/25/2015   CLINICAL DATA:  Central chest pain for 1 week,  tachycardia, hypoxemia, shortness of breath.  EXAM: CT ANGIOGRAPHY CHEST WITH CONTRAST  TECHNIQUE: Multidetector CT imaging of the chest was performed using the standard protocol during bolus administration of intravenous contrast. Multiplanar CT image reconstructions and MIPs were obtained to evaluate the vascular anatomy.  CONTRAST:  158mL OMNIPAQUE IOHEXOL 350 MG/ML SOLN  COMPARISON:  None.  FINDINGS: There is no pulmonary embolism identified within the main, lobar, or central segmental pulmonary arteries. Some of the most peripheral segmental and subsegmental pulmonary arteries are difficult to definitively characterize due to mild patient breathing motion artifact.  There is a moderate-sized pericardial effusion, measuring 2 cm greatest thickness. Heart size is upper normal. No masses or enlarged lymph nodes seen within the mediastinum or perihilar regions. Thoracic aorta is normal in caliber. No aortic aneurysm or dissection.  There are bilateral pleural effusions, small to moderate in size, with adjacent compressive atelectasis. Additional mild atelectasis noted within the right middle lobe and right upper lobe. Small emphysematous blebs at each lung apex. Trachea and central bronchi are unremarkable.  Limited images of the upper abdomen are also provided. There are scattered small hypodense foci within the bilateral liver lobes, incompletely imaged, which are too small to definitively characterize but probably cysts. Mild degenerative changes noted within the thoracic spine but no acute osseous abnormality.  Review of the MIP images confirms the above findings.  IMPRESSION: 1. Pericardial effusion, moderate in size, measuring 2 cm greatest thickness. 2. No pulmonary embolism seen, with mild study limitations detailed above. 3. Bilateral pleural effusions, small to moderate in size (left slightly greater than right), with adjacent compressive atelectasis. 4. Scattered tiny hypodense foci within the bilateral  liver lobes, incompletely imaged, too small to characterize but probably benign cysts. 5. Mild biapical emphysematous change. These results were called by telephone at the time of interpretation on 03/25/2015 at 1:07 pm to Dr. Virgel Manifold , who verbally acknowledged these results.   Electronically Signed   By: Franki Cabot M.D.   On: 03/25/2015 13:09     ASSESSMENT:  1. Pericardial effusion, moderate   --no tamponade by [place] 7/22 2. Acute systolic HF   --EF ~85% 3. RA 4. DM2 with h/o osteomyelitis LLE 5. Chest pain     --CT 7/22 negative for PE or coronary calcifications    --Trop - x 1 6. Chronic LBBB 7. Hyponatremia  PLAN/DISCUSSION:  He has moderate sized pericardial effusion with significant LV dysfunction and thickening of RV free wall. History is not consistent with pericarditis but might be masked by chronic steroids. No rub on exam. Swan numbers not c/w with tamponade. Will continue colchicine. Stop NSAIDs. Continue daily prednisone. Consider stress-dose steroids as needed.  Will need coronary angio and cMRI on Monday followed by possible pericardial window.  Will check UA, ANA and place PPD. I will reach out to Dr. Ouida Sills and see if he has further recommendations. Start low-dose b-blocker.   LOS: 1 day    Glori Bickers, MD 03/26/2015, 10:03 AM

## 2015-03-27 ENCOUNTER — Inpatient Hospital Stay (HOSPITAL_COMMUNITY): Payer: BLUE CROSS/BLUE SHIELD | Admitting: Certified Registered"

## 2015-03-27 ENCOUNTER — Encounter (HOSPITAL_COMMUNITY)
Admission: EM | Disposition: A | Discharge status: Home / Self Care | Payer: Self-pay | Source: Home / Self Care | Attending: Internal Medicine

## 2015-03-27 DIAGNOSIS — I480 Paroxysmal atrial fibrillation: Secondary | ICD-10-CM

## 2015-03-27 HISTORY — PX: CARDIOVERSION: SHX1299

## 2015-03-27 LAB — CBC
HCT: 39.8 % (ref 39.0–52.0)
Hemoglobin: 13.2 g/dL (ref 13.0–17.0)
MCH: 31.2 pg (ref 26.0–34.0)
MCHC: 33.2 g/dL (ref 30.0–36.0)
MCV: 94.1 fL (ref 78.0–100.0)
Platelets: 348 10*3/uL (ref 150–400)
RBC: 4.23 MIL/uL (ref 4.22–5.81)
RDW: 14.9 % (ref 11.5–15.5)
WBC: 8.9 10*3/uL (ref 4.0–10.5)

## 2015-03-27 LAB — BASIC METABOLIC PANEL
ANION GAP: 9 (ref 5–15)
BUN: 18 mg/dL (ref 6–20)
CHLORIDE: 105 mmol/L (ref 101–111)
CO2: 23 mmol/L (ref 22–32)
CREATININE: 0.88 mg/dL (ref 0.61–1.24)
Calcium: 8.3 mg/dL — ABNORMAL LOW (ref 8.9–10.3)
GFR calc non Af Amer: >60 mL/min (ref >60)
Glucose, Bld: 227 mg/dL — ABNORMAL HIGH (ref 65–99)
POTASSIUM: 4.4 mmol/L (ref 3.5–5.1)
Sodium: 137 mmol/L (ref 135–145)

## 2015-03-27 LAB — MAGNESIUM: Magnesium: 2 mg/dL (ref 1.7–2.4)

## 2015-03-27 LAB — HIV ANTIBODY (ROUTINE TESTING W REFLEX): HIV Screen 4th Generation wRfx: NONREACTIVE

## 2015-03-27 LAB — APTT: APTT: 28 s (ref 24–37)

## 2015-03-27 SURGERY — CARDIOVERSION
Anesthesia: General

## 2015-03-27 MED ORDER — AMIODARONE LOAD VIA INFUSION
150.0000 mg | Freq: Once | INTRAVENOUS | Status: AC
  Administered 2015-03-27: 150 mg via INTRAVENOUS
  Filled 2015-03-27: qty 83.34

## 2015-03-27 MED ORDER — FUROSEMIDE 20 MG PO TABS
20.0000 mg | ORAL_TABLET | Freq: Once | ORAL | Status: AC
  Administered 2015-03-27: 20 mg via ORAL
  Filled 2015-03-27: qty 1

## 2015-03-27 MED ORDER — SODIUM CHLORIDE 0.9 % IJ SOLN: 3.0000 mL | INTRAMUSCULAR | Status: DC | PRN

## 2015-03-27 MED ORDER — SODIUM CHLORIDE 0.9 % IV SOLN: 250.0000 mL | INTRAVENOUS | Status: DC | PRN

## 2015-03-27 MED ORDER — AMIODARONE HCL IN DEXTROSE 360-4.14 MG/200ML-% IV SOLN
30.0000 mg/h | INTRAVENOUS | Status: DC
  Administered 2015-03-27 – 2015-03-29 (×4): 30 mg/h via INTRAVENOUS
  Filled 2015-03-27 (×9): qty 200

## 2015-03-27 MED ORDER — SODIUM CHLORIDE 0.9 % IV SOLN: INTRAVENOUS | Status: DC

## 2015-03-27 MED ORDER — DILTIAZEM LOAD VIA INFUSION
10.0000 mg | Freq: Once | INTRAVENOUS | Status: AC
  Administered 2015-03-27: 10 mg via INTRAVENOUS

## 2015-03-27 MED ORDER — AMIODARONE HCL IN DEXTROSE 360-4.14 MG/200ML-% IV SOLN
60.0000 mg/h | INTRAVENOUS | Status: DC
  Administered 2015-03-27 (×2): 60 mg/h via INTRAVENOUS
  Filled 2015-03-27 (×2): qty 200

## 2015-03-27 MED ORDER — PROPOFOL 10 MG/ML IV BOLUS
INTRAVENOUS | Status: DC | PRN
  Administered 2015-03-27: 70 mg via INTRAVENOUS

## 2015-03-27 MED ORDER — CARVEDILOL 3.125 MG PO TABS: 3.1250 mg | ORAL_TABLET | Freq: Two times a day (BID) | ORAL | Status: DC

## 2015-03-27 MED ORDER — ASPIRIN 81 MG PO CHEW
81.0000 mg | CHEWABLE_TABLET | ORAL | Status: AC
  Administered 2015-03-28: 81 mg via ORAL
  Filled 2015-03-27: qty 1

## 2015-03-27 MED ORDER — DILTIAZEM LOAD VIA INFUSION
10.0000 mg | Freq: Once | INTRAVENOUS | Status: AC
  Administered 2015-03-27: 10 mg via INTRAVENOUS
  Filled 2015-03-27: qty 10

## 2015-03-27 MED ORDER — AMIODARONE LOAD VIA INFUSION
300.0000 mg | Freq: Once | INTRAVENOUS | Status: AC
  Administered 2015-03-27: 300 mg via INTRAVENOUS
  Filled 2015-03-27: qty 166.67

## 2015-03-27 MED ORDER — SODIUM CHLORIDE 0.9 % IJ SOLN: 3.0000 mL | Freq: Two times a day (BID) | INTRAMUSCULAR | Status: DC

## 2015-03-27 MED ORDER — CARVEDILOL 3.125 MG PO TABS
3.1250 mg | ORAL_TABLET | Freq: Two times a day (BID) | ORAL | Status: DC
  Administered 2015-03-27 (×2): 3.125 mg via ORAL
  Filled 2015-03-27 (×3): qty 1

## 2015-03-27 MED ORDER — CARVEDILOL 3.125 MG PO TABS
3.1250 mg | ORAL_TABLET | Freq: Two times a day (BID) | ORAL | Status: DC
  Administered 2015-03-28: 3.125 mg via ORAL
  Filled 2015-03-27 (×3): qty 1

## 2015-03-27 MED ORDER — ASPIRIN 81 MG PO CHEW
81.0000 mg | CHEWABLE_TABLET | ORAL | Status: DC
Start: 2015-03-28 — End: 2015-03-28

## 2015-03-27 MED ORDER — AMIODARONE HCL IN DEXTROSE 360-4.14 MG/200ML-% IV SOLN
INTRAVENOUS | Status: AC
  Filled 2015-03-27: qty 200

## 2015-03-27 MED ORDER — DILTIAZEM HCL 100 MG IV SOLR
5.0000 mg/h | INTRAVENOUS | Status: DC
  Administered 2015-03-27: 5 mg/h via INTRAVENOUS
  Filled 2015-03-27: qty 100

## 2015-03-27 NOTE — Progress Notes (Signed)
Called MD Turner to discuss patient's heart rate due to heart rate and blood pressure still high after initiating and titrating Cardizem drip. MD Turner advised to begin an Amiodarone drip and to keep Cardizem at 5.  Vicie Mutters, RN

## 2015-03-27 NOTE — Anesthesia Postprocedure Evaluation (Signed)
  Anesthesia Post-op Note  Patient: Ethan Alexander  Procedure(s) Performed: Procedure(s): CARDIOVERSION (N/A)  Patient Location: ICU  Anesthesia Type:General  Level of Consciousness: awake, alert  and oriented  Airway and Oxygen Therapy: Patient Spontanous Breathing and Patient connected to nasal cannula oxygen  Post-op Pain: none  Post-op Assessment: Post-op Vital signs reviewed, Patient's Cardiovascular Status Stable, Respiratory Function Stable, Patent Airway and No signs of Nausea or vomiting              Post-op Vital Signs: Reviewed and stable  Last Vitals:  Filed Vitals:   03/27/15 0900  BP: 130/97  Pulse: 120  Temp: 37.2 C  Resp: 11    Complications: No apparent anesthesia complications

## 2015-03-27 NOTE — Transfer of Care (Signed)
Immediate Anesthesia Transfer of Care Note  Patient: Ethan Alexander  Procedure(s) Performed: Procedure(s): CARDIOVERSION (N/A)  Patient Location: ICU  Anesthesia Type:General  Level of Consciousness: awake, alert  and oriented  Airway & Oxygen Therapy: Patient Spontanous Breathing and Patient connected to nasal cannula oxygen  Post-op Assessment: Report given to RN and Post -op Vital signs reviewed and stable  Post vital signs: Reviewed and stable  Last Vitals:  Filed Vitals:   03/27/15 0900  BP: 130/97  Pulse: 120  Temp: 37.2 C  Resp: 11    Complications: No apparent anesthesia complications

## 2015-03-27 NOTE — Progress Notes (Addendum)
Subjective:   64 y/o with RA (on chronic prednisone - followed by Dr. Ouida Sills), DM2, LBBB admitted with CP and found to have moderate pericardial effusion, with thickened RV free wall and EF 30%. CT negative for PE.   Taken to cath lab on 7/22 for RHC.  No evidence of tamponade RA 9 RV 33/6 (21) PCWP 9 Thermo 5.8/2.5 Fick 4.2/1.8  Developed AF with RVR with HRs in 180s. Now on IV amio and cardizem. Feels ok. Fatigued. No dyspnea or CP.   Swan numbers this am.  RA 7 PA 36/21 (26) PCWP 19 Thermo 5.8/2.5 SVR 1353   Intake/Output Summary (Last 24 hours) at 03/27/15 0811 Last data filed at 03/27/15 0630  Gross per 24 hour  Intake 1805.27 ml  Output   1600 ml  Net 205.27 ml    Current meds: . cholecalciferol  1,000 Units Oral Daily  . colchicine  0.6 mg Oral BID  . docusate sodium  100 mg Oral BID  . folic acid  1 mg Oral Daily  . heparin  5,000 Units Subcutaneous 3 times per day  . methylPREDNISolone (SOLU-MEDROL) injection  125 mg Intravenous Q6H  . ondansetron  4 mg Oral BID  . pantoprazole  40 mg Oral Daily  . predniSONE  10 mg Oral Daily  . sodium chloride  3 mL Intravenous Q12H  . sodium chloride  3 mL Intravenous Q12H  . tuberculin  5 Units Intradermal Once   Infusions: . sodium chloride Stopped (03/26/15 0800)  . sodium chloride 500 mL (03/26/15 2234)  . amiodarone 60 mg/hr (03/27/15 0458)   Followed by  . amiodarone    . diltiazem (CARDIZEM) infusion 10 mg/hr (03/27/15 0530)     Objective:  Blood pressure 128/85, pulse 132, temperature 99 F (37.2 C), temperature source Core (Comment), resp. rate 18, height 6\' 6"  (1.981 m), weight 95.8 kg (211 lb 3.2 oz), SpO2 93 %. Weight change:   Physical Exam: General:  Fatigued appearing. No resp difficulty HEENT: normal Neck: supple.  RIJ swan Carotids 2+ bilat; no bruits. No lymphadenopathy or thryomegaly appreciated. Cor: PMI nondisplaced. Tachy irregular No rubs, gallops or murmurs. Lungs:  clear Abdomen: soft, nontender, nondistended. No hepatosplenomegaly. No bruits or masses. Good bowel sounds. Extremities: no cyanosis, clubbing, rash, edema no rash. + synovial thickening and RA changes Neuro: alert & orientedx3, cranial nerves grossly intact. moves all 4 extremities w/o difficulty. Affect pleasant  Telemetry: AF 120-130s  Lab Results: Basic Metabolic Panel:  Recent Labs Lab 03/25/15 1052 03/26/15 03/27/15 0359  NA 134* 131* 137  K 4.5 4.4 4.4  CL 104 100* 105  CO2 21* 21* 23  GLUCOSE 166* 245* 227*  BUN 26* 19 18  CREATININE 1.23 1.03 0.88  CALCIUM 8.7* 8.2* 8.3*  MG  --   --  2.0   Liver Function Tests:  Recent Labs Lab 03/26/15  AST 15  ALT 15*  ALKPHOS 54  BILITOT 0.9  PROT 6.3*  ALBUMIN 2.3*   No results for input(s): LIPASE, AMYLASE in the last 168 hours. No results for input(s): AMMONIA in the last 168 hours. CBC:  Recent Labs Lab 03/25/15 1052 03/26/15 03/27/15 0359  WBC 12.3* 8.0 8.9  HGB 14.4 12.9* 13.2  HCT 43.5 39.6 39.8  MCV 96.5 95.9 94.1  PLT 403* 321 348   Cardiac Enzymes: No results for input(s): CKTOTAL, CKMB, CKMBINDEX, TROPONINI in the last 168 hours. BNP: Invalid input(s): POCBNP CBG: No results for input(s): GLUCAP in the last  168 hours. Microbiology: Lab Results  Component Value Date   CULT  03/25/2015    NO GROWTH 1 DAY Performed at [facility] 03/25/15 1319  CULT NO GROWTH 1 DAYPerformed at [place], [facility]    Imaging: Dg Chest 2 View  03/25/2015   CLINICAL DATA:  Gradual onset mid chest pain this morning.  EXAM: CHEST  2 VIEW  COMPARISON:  Chest x-ray dated 06/05/2012.  FINDINGS: There is mild cardiomegaly which may be somewhat accentuated by low lung volumes. Ill-defined airspace opacity is seen at the right lung base and there is a denser opacity at the left lung base. Suspect small bilateral pleural effusions, left greater than right. Upper  lungs are relatively clear. No pneumothorax. No osseous abnormality seen.  IMPRESSION: 1. Cardiomegaly. 2. Bibasilar airspace opacities, left greater than right. These could be atelectasis or pneumonia. 3. Small bilateral pleural effusions, left greater than right.   Electronically Signed   By: Franki Cabot M.D.   On: 03/25/2015 11:16   Ct Angio Chest Pe W/cm &/or Wo Cm  03/25/2015   CLINICAL DATA:  Central chest pain for 1 week, tachycardia, hypoxemia, shortness of breath.  EXAM: CT ANGIOGRAPHY CHEST WITH CONTRAST  TECHNIQUE: Multidetector CT imaging of the chest was performed using the standard protocol during bolus administration of intravenous contrast. Multiplanar CT image reconstructions and MIPs were obtained to evaluate the vascular anatomy.  CONTRAST:  144mL OMNIPAQUE IOHEXOL 350 MG/ML SOLN  COMPARISON:  None.  FINDINGS: There is no pulmonary embolism identified within the main, lobar, or central segmental pulmonary arteries. Some of the most peripheral segmental and subsegmental pulmonary arteries are difficult to definitively characterize due to mild patient breathing motion artifact.  There is a moderate-sized pericardial effusion, measuring 2 cm greatest thickness. Heart size is upper normal. No masses or enlarged lymph nodes seen within the mediastinum or perihilar regions. Thoracic aorta is normal in caliber. No aortic aneurysm or dissection.  There are bilateral pleural effusions, small to moderate in size, with adjacent compressive atelectasis. Additional mild atelectasis noted within the right middle lobe and right upper lobe. Small emphysematous blebs at each lung apex. Trachea and central bronchi are unremarkable.  Limited images of the upper abdomen are also provided. There are scattered small hypodense foci within the bilateral liver lobes, incompletely imaged, which are too small to definitively characterize but probably cysts. Mild degenerative changes noted within the thoracic spine but  no acute osseous abnormality.  Review of the MIP images confirms the above findings.  IMPRESSION: 1. Pericardial effusion, moderate in size, measuring 2 cm greatest thickness. 2. No pulmonary embolism seen, with mild study limitations detailed above. 3. Bilateral pleural effusions, small to moderate in size (left slightly greater than right), with adjacent compressive atelectasis. 4. Scattered tiny hypodense foci within the bilateral liver lobes, incompletely imaged, too small to characterize but probably benign cysts. 5. Mild biapical emphysematous change. These results were called by telephone at the time of interpretation on 03/25/2015 at 1:07 pm to Dr. Virgel Manifold , who verbally acknowledged these results.   Electronically Signed   By: Franki Cabot M.D.   On: 03/25/2015 13:09     ASSESSMENT:  1. Pericardial effusion, moderate   --no tamponade by [place] 7/22 2. Acute systolic HF   --EF ~34% 3. RA 4. DM2 with h/o osteomyelitis LLE 5. Chest pain     --CT 7/22 negative for PE or coronary  calcifications    --Trop - x 1 6. Chronic LBBB 7. Hyponatremia 8. Atrial fibrillation with RVR  PLAN/DISCUSSION:  He has moderate sized pericardial effusion with significant LV dysfunction and thickening of RV free wall. History is not consistent with pericarditis but might be masked by chronic steroids. Swan numbers not c/w with tamponade. I spoke with Marigene Ehlers (his rheumatologist) and we both feel that his effusion is related to chronic RA. He has not been on biologics due to financial concerns and thus has had persistent smoldering inflammation (supported by CRP 29). He is in the process of getting 4 doses of high-dose solumedrol and then will switch back to oral prednisone 10. UA and HIV negative. PPD placed. ANA pending. With chronic steroids would like to avoid taking him for window, if possible.   Etiology of LV dysfunction unclear. High-risk for CAD with RA but chest CT without significant coronary  calcification. Will plan coronary angio in am. Followed by cMRI to further assess for scar and thickening of RV free wall.   Developed AF with RVR. Not responding to rate control. With moderate effusion would like to avoid anticoagulation. Will continue amio and plan DC-CV later today.   Can pull swan (leave cordis)   Wife also says he appears to have severe OSA. Will need outpatient sleep study.   The HF team will assume care.   The patient is critically ill with multiple organ systems failure and requires high complexity decision making for assessment and support, frequent evaluation and titration of therapies, application of advanced monitoring technologies and extensive interpretation of multiple databases.   Critical Care Time devoted to patient care services described in this note is 35 Minutes.     LOS: 2 days  Glori Bickers, MD 03/27/2015, 8:11 AM

## 2015-03-27 NOTE — Progress Notes (Addendum)
Patient's heart rate still running 130-140's.  MD Turner ordered an Amiodarone bolus and advised to wait thirty minutes and if heart rate still elevated give a 10 mg cardizem bolus and increase cardizem rate to 10 mg.  Vicie Mutters, RN

## 2015-03-27 NOTE — Anesthesia Preprocedure Evaluation (Addendum)
Anesthesia Evaluation  Patient identified by MRN, date of birth, ID band Patient awake    Reviewed: Allergy & Precautions, NPO status , Patient's Chart, lab work & pertinent test results  History of Anesthesia Complications Negative for: history of anesthetic complications  Airway Mallampati: II  TM Distance: >3 FB Neck ROM: Full    Dental  (+) Teeth Intact, Dental Advisory Given   Pulmonary former smoker,    Pulmonary exam normal       Cardiovascular hypertension, +CHF Rhythm:Irregular Rate:Tachycardia  LV EF: 40% -  45%    Neuro/Psych negative neurological ROS  negative psych ROS   GI/Hepatic negative GI ROS, Neg liver ROS,   Endo/Other  diabetes  Renal/GU negative Renal ROS     Musculoskeletal   Abdominal   Peds  Hematology   Anesthesia Other Findings   Reproductive/Obstetrics                           Anesthesia Physical Anesthesia Plan  ASA: III  Anesthesia Plan: General   Post-op Pain Management:    Induction: Intravenous  Airway Management Planned: Mask  Additional Equipment:   Intra-op Plan:   Post-operative Plan:   Informed Consent: I have reviewed the patients History and Physical, chart, labs and discussed the procedure including the risks, benefits and alternatives for the proposed anesthesia with the patient or authorized representative who has indicated his/her understanding and acceptance.   Dental advisory given  Plan Discussed with: CRNA, Anesthesiologist and Surgeon  Anesthesia Plan Comments:        Anesthesia Quick Evaluation

## 2015-03-27 NOTE — CV Procedure (Signed)
     DIRECT CURRENT CARDIOVERSION  NAME:  Ethan Alexander   MRN: [id number] DOB:  01/15/1951   ADMIT DATE: 03/25/2015   INDICATIONS: Atrial fibrillation with RVR   PROCEDURE:   Informed consent was obtained prior to the procedure. The risks, benefits and alternatives for the procedure were discussed and the patient comprehended these risks. Once an appropriate time out was taken, the patient had the defibrillator pads placed in the anterior and posterior position. The patient then underwent sedation by the anesthesia service with IV propofol. Once an appropriate level of sedation was achieved, the patient received a single biphasic, synchronized 150J shock with prompt conversion to sinus rhythm. No apparent complications.   Krissie Merrick,MD 10:45 AM

## 2015-03-27 NOTE — Progress Notes (Signed)
Patient's heart rate increased in to 160-180, patient experiencing shortness of breath and chest pain. EKG showed A-fib RVR.  Gave patient morphine and placed on 3 L oxygen.  Called and spoke with MD Turner, new orders received. Vicie Mutters, RN

## 2015-03-28 ENCOUNTER — Encounter (HOSPITAL_COMMUNITY)
Admission: EM | Disposition: A | Discharge status: Home / Self Care | Payer: Self-pay | Source: Home / Self Care | Attending: Internal Medicine

## 2015-03-28 ENCOUNTER — Encounter (HOSPITAL_COMMUNITY): Payer: Self-pay | Admitting: Cardiology

## 2015-03-28 HISTORY — PX: CARDIAC CATHETERIZATION: SHX172

## 2015-03-28 LAB — CREATININE, SERUM
Creatinine, Ser: 1.06 mg/dL (ref 0.61–1.24)
GFR calc Af Amer: >60 mL/min (ref >60)
GFR calc non Af Amer: >60 mL/min (ref >60)

## 2015-03-28 LAB — CBC
HCT: 40.9 % (ref 39.0–52.0)
Hemoglobin: 13.4 g/dL (ref 13.0–17.0)
MCH: 31.2 pg (ref 26.0–34.0)
MCHC: 32.8 g/dL (ref 30.0–36.0)
MCV: 95.3 fL (ref 78.0–100.0)
Platelets: 360 10*3/uL (ref 150–400)
RBC: 4.29 MIL/uL (ref 4.22–5.81)
RDW: 15 % (ref 11.5–15.5)
WBC: 12 10*3/uL — AB (ref 4.0–10.5)

## 2015-03-28 LAB — PROTIME-INR
INR: 1.15 (ref 0.00–1.49)
Prothrombin Time: 14.9 seconds (ref 11.6–15.2)

## 2015-03-28 LAB — GLUCOSE, CAPILLARY: GLUCOSE-CAPILLARY: 175 mg/dL — AB (ref 65–99)

## 2015-03-28 SURGERY — LEFT HEART CATH
Anesthesia: LOCAL

## 2015-03-28 MED ORDER — HEPARIN (PORCINE) IN NACL 2-0.9 UNIT/ML-% IJ SOLN
INTRAMUSCULAR | Status: AC
  Filled 2015-03-28: qty 1500

## 2015-03-28 MED ORDER — ONDANSETRON HCL 4 MG/2ML IJ SOLN: 4.0000 mg | Freq: Four times a day (QID) | INTRAMUSCULAR | Status: DC | PRN

## 2015-03-28 MED ORDER — HEPARIN SODIUM (PORCINE) 1000 UNIT/ML IJ SOLN
INTRAMUSCULAR | Status: AC
  Filled 2015-03-28: qty 1

## 2015-03-28 MED ORDER — SODIUM CHLORIDE 0.9 % IV SOLN: 250.0000 mL | INTRAVENOUS | Status: DC | PRN

## 2015-03-28 MED ORDER — IOHEXOL 350 MG/ML SOLN
INTRAVENOUS | Status: DC | PRN
  Administered 2015-03-28: 35 mL via INTRACARDIAC

## 2015-03-28 MED ORDER — LIDOCAINE HCL (PF) 1 % IJ SOLN
INTRAMUSCULAR | Status: DC | PRN
  Administered 2015-03-28: 5 mL via SUBCUTANEOUS

## 2015-03-28 MED ORDER — SODIUM CHLORIDE 0.9 % IJ SOLN
3.0000 mL | Freq: Two times a day (BID) | INTRAMUSCULAR | Status: DC
  Administered 2015-03-28 – 2015-03-29 (×3): 3 mL via INTRAVENOUS

## 2015-03-28 MED ORDER — FENTANYL CITRATE (PF) 100 MCG/2ML IJ SOLN
INTRAMUSCULAR | Status: AC
  Filled 2015-03-28: qty 2

## 2015-03-28 MED ORDER — LIDOCAINE HCL (PF) 1 % IJ SOLN
INTRAMUSCULAR | Status: DC | PRN
  Administered 2015-03-28: 10:00:00

## 2015-03-28 MED ORDER — ACETAMINOPHEN 325 MG PO TABS: 650.0000 mg | ORAL_TABLET | ORAL | Status: DC | PRN

## 2015-03-28 MED ORDER — SODIUM CHLORIDE 0.9 % IV SOLN: INTRAVENOUS | Status: AC

## 2015-03-28 MED ORDER — HEPARIN SODIUM (PORCINE) 1000 UNIT/ML IJ SOLN
INTRAMUSCULAR | Status: DC | PRN
  Administered 2015-03-28: 4500 [IU] via INTRAVENOUS

## 2015-03-28 MED ORDER — VERAPAMIL HCL 2.5 MG/ML IV SOLN
INTRAVENOUS | Status: AC
  Filled 2015-03-28: qty 2

## 2015-03-28 MED ORDER — MIDAZOLAM HCL 2 MG/2ML IJ SOLN
INTRAMUSCULAR | Status: AC
  Filled 2015-03-28: qty 2

## 2015-03-28 MED ORDER — SODIUM CHLORIDE 0.9 % IV SOLN
INTRAVENOUS | Status: DC | PRN
  Administered 2015-03-28: 10 mL/h via INTRAVENOUS

## 2015-03-28 MED ORDER — VERAPAMIL HCL 2.5 MG/ML IV SOLN
INTRAVENOUS | Status: DC | PRN
  Administered 2015-03-28: 10:00:00 via INTRA_ARTERIAL

## 2015-03-28 MED ORDER — MIDAZOLAM HCL 2 MG/2ML IJ SOLN
INTRAMUSCULAR | Status: DC | PRN
  Administered 2015-03-28: 2 mg via INTRAVENOUS

## 2015-03-28 MED ORDER — LIDOCAINE HCL (PF) 1 % IJ SOLN
INTRAMUSCULAR | Status: AC
  Filled 2015-03-28: qty 30

## 2015-03-28 MED ORDER — FENTANYL CITRATE (PF) 100 MCG/2ML IJ SOLN
INTRAMUSCULAR | Status: DC | PRN
  Administered 2015-03-28: 25 ug via INTRAVENOUS

## 2015-03-28 MED ORDER — SODIUM CHLORIDE 0.9 % IJ SOLN: 3.0000 mL | INTRAMUSCULAR | Status: DC | PRN

## 2015-03-28 MED ORDER — HEPARIN SODIUM (PORCINE) 5000 UNIT/ML IJ SOLN: 5000.0000 [IU] | Freq: Three times a day (TID) | INTRAMUSCULAR | Status: DC

## 2015-03-28 SURGICAL SUPPLY — 12 items
CATH INFINITI 5 FR JL3.5 (CATHETERS) ×2 IMPLANT
CATH INFINITI 5FR ANG PIGTAIL (CATHETERS) IMPLANT
CATH INFINITI JR4 5F (CATHETERS) ×2 IMPLANT
DEVICE RAD COMP TR BAND LRG (VASCULAR PRODUCTS) ×2 IMPLANT
GLIDESHEATH SLEND SS 6F .021 (SHEATH) ×2 IMPLANT
KIT HEART LEFT (KITS) ×2 IMPLANT
PACK CARDIAC CATHETERIZATION (CUSTOM PROCEDURE TRAY) ×2 IMPLANT
SYR MEDRAD MARK V 150ML (SYRINGE) IMPLANT
TRANSDUCER W/STOPCOCK (MISCELLANEOUS) ×2 IMPLANT
TUBING CIL FLEX 10 FLL-RA (TUBING) ×2 IMPLANT
WIRE HI TORQ VERSACORE-J 145CM (WIRE) ×2 IMPLANT
WIRE SAFE-T 1.5MM-J .035X260CM (WIRE) ×2 IMPLANT

## 2015-03-28 NOTE — H&P (View-Only) (Signed)
Subjective:   64 y/o with RA (on chronic prednisone - followed by Dr. Ouida Sills), DM2, LBBB admitted with CP and found to have moderate pericardial effusion, with thickened RV free wall and EF 30%. CT negative for PE.   Taken to cath lab on 7/22 for RHC.  No evidence of tamponade RA 9 RV 33/6 (21) PCWP 9 Thermo 5.8/2.5 Fick 4.2/1.8  Events 7/24 DC-CV  --> NSR   Denies SOB.     .  Intake/Output Summary (Last 24 hours) at 03/28/15 0709 Last data filed at 03/28/15 0600  Gross per 24 hour  Intake  790.5 ml  Output   1100 ml  Net -309.5 ml    Current meds: . aspirin  81 mg Oral Pre-Cath  . carvedilol  3.125 mg Oral BID WC  . cholecalciferol  1,000 Units Oral Daily  . colchicine  0.6 mg Oral BID  . docusate sodium  100 mg Oral BID  . folic acid  1 mg Oral Daily  . heparin  5,000 Units Subcutaneous 3 times per day  . ondansetron  4 mg Oral BID  . pantoprazole  40 mg Oral Daily  . predniSONE  10 mg Oral Daily  . sodium chloride  3 mL Intravenous Q12H  . sodium chloride  3 mL Intravenous Q12H  . tuberculin  5 Units Intradermal Once   Infusions: . sodium chloride Stopped (03/26/15 0800)  . sodium chloride 500 mL (03/26/15 2234)  . sodium chloride    . amiodarone 30 mg/hr (03/28/15 0430)     Objective:  Blood pressure 130/67, pulse 56, temperature 98.3 F (36.8 C), temperature source Oral, resp. rate 14, height 6\' 6"  (1.981 m), weight 206 lb (93.441 kg), SpO2 92 %. Weight change:   Physical Exam: General:  Well appearing.  No resp difficulty HEENT: normal Neck: supple.  RIJ swan Carotids 2+ bilat; no bruits. No lymphadenopathy or thryomegaly appreciated. Cor: PMI nondisplaced. Regular.  No rubs, gallops or murmurs. Lungs: clear Abdomen: soft, nontender, nondistended. No hepatosplenomegaly. No bruits or masses. Good bowel sounds. Extremities: no cyanosis, clubbing, rash, edema no rash. + synovial thickening and RA changes Neuro: alert & orientedx3, cranial nerves  grossly intact. moves all 4 extremities w/o difficulty. Affect pleasant  Telemetry: NSR 60s Lab Results: Basic Metabolic Panel:  Recent Labs Lab 03/25/15 1052 03/26/15 03/27/15 0359  NA 134* 131* 137  K 4.5 4.4 4.4  CL 104 100* 105  CO2 21* 21* 23  GLUCOSE 166* 245* 227*  BUN 26* 19 18  CREATININE 1.23 1.03 0.88  CALCIUM 8.7* 8.2* 8.3*  MG  --   --  2.0   Liver Function Tests:  Recent Labs Lab 03/26/15  AST 15  ALT 15*  ALKPHOS 54  BILITOT 0.9  PROT 6.3*  ALBUMIN 2.3*   No results for input(s): LIPASE, AMYLASE in the last 168 hours. No results for input(s): AMMONIA in the last 168 hours. CBC:  Recent Labs Lab 03/25/15 1052 03/26/15 03/27/15 0359  WBC 12.3* 8.0 8.9  HGB 14.4 12.9* 13.2  HCT 43.5 39.6 39.8  MCV 96.5 95.9 94.1  PLT 403* 321 348   Cardiac Enzymes: No results for input(s): CKTOTAL, CKMB, CKMBINDEX, TROPONINI in the last 168 hours. BNP: Invalid input(s): POCBNP CBG:  Recent Labs Lab 03/28/15 0618  GLUCAP 175*   Microbiology: Lab Results  Component Value Date   CULT  03/25/2015    NO GROWTH 1 DAY Performed at [facility]     Recent  Labs Lab 03/25/15 1319  CULT NO GROWTH 1 DAYPerformed at [place], [facility] C[facility]    Imaging: No results found.   ASSESSMENT:  1. Pericardial effusion, moderate   --no tamponade by [place] 7/22 2. Acute systolic HF   --EF ~31% 3. RA 4. DM2 with h/o osteomyelitis LLE 5. Chest pain     --CT 7/22 negative for PE or coronary calcifications    --Trop - x 1 6. Chronic LBBB 7. Hyponatremia 8. Atrial fibrillation with RVR  PLAN/DISCUSSION: He has moderate sized pericardial effusion with significant LV dysfunction and thickening of RV free wall. History is not consistent with pericarditis but might be masked by chronic steroids. Swan numbers not c/w with tamponade. He has not been on biologics due to financial concerns and thus has had persistent smoldering inflammation  (supported by CRP 29). He is in the process of getting 4 doses of high-dose solumedrol and then will switch back to oral prednisone 10. UA and HIV negative. PPD placed. ANA pending.   Per Dr Haroldine Laws. With chronic steroids would like to avoid taking him for window, if possible.   Etiology of LV dysfunction unclear. High-risk for CAD with RA but chest CT without significant coronary calcification. LHC this am. Followed by cMRI to further assess for scar and thickening of RV free wall.   7/24 DC-CV Maintaining NSR. Continue amio for 5 gram load. No anticoagulation with moderate effusion. On subcutaneous heparin.   DVT prophylaxis. Continue SCDs  Wife also says he appears to have severe OSA. Will need outpatient sleep study.     LOS: 3 days  CLEGG,AMY, NP 03/28/2015, 7:09 AM  Patient seen and examined with Darrick Grinder, NP. We discussed all aspects of the encounter. I agree with the assessment and plan as stated above.   Improved today. Maintaining NSR after DC-CV yesterday. Has received high-dose solumedrol. For cath this am then will need cMRI this afternoon or tomorrow. Continue amio load. HIV negative.   [place] Gipe,MD 8:25 AM

## 2015-03-28 NOTE — Progress Notes (Signed)
TR band removed following protocol. TR band removed without complication. Site level 0. No bleeding, no bruising, no hematoma at site. Dressing applied to site. Patient educated on care of right arm.   Roxan Hockey, RN

## 2015-03-28 NOTE — Progress Notes (Signed)
Subjective:   64 y/o with RA (on chronic prednisone - followed by Dr. Ouida Sills), DM2, LBBB admitted with CP and found to have moderate pericardial effusion, with thickened RV free wall and EF 30%. CT negative for PE.   Taken to cath lab on 7/22 for RHC.  No evidence of tamponade RA 9 RV 33/6 (21) PCWP 9 Thermo 5.8/2.5 Fick 4.2/1.8  Events 7/24 DC-CV  --> NSR   Denies SOB.     .  Intake/Output Summary (Last 24 hours) at 03/28/15 0709 Last data filed at 03/28/15 0600  Gross per 24 hour  Intake  790.5 ml  Output   1100 ml  Net -309.5 ml    Current meds: . aspirin  81 mg Oral Pre-Cath  . carvedilol  3.125 mg Oral BID WC  . cholecalciferol  1,000 Units Oral Daily  . colchicine  0.6 mg Oral BID  . docusate sodium  100 mg Oral BID  . folic acid  1 mg Oral Daily  . heparin  5,000 Units Subcutaneous 3 times per day  . ondansetron  4 mg Oral BID  . pantoprazole  40 mg Oral Daily  . predniSONE  10 mg Oral Daily  . sodium chloride  3 mL Intravenous Q12H  . sodium chloride  3 mL Intravenous Q12H  . tuberculin  5 Units Intradermal Once   Infusions: . sodium chloride Stopped (03/26/15 0800)  . sodium chloride 500 mL (03/26/15 2234)  . sodium chloride    . amiodarone 30 mg/hr (03/28/15 0430)     Objective:  Blood pressure 130/67, pulse 56, temperature 98.3 F (36.8 C), temperature source Oral, resp. rate 14, height 6\' 6"  (1.981 m), weight 206 lb (93.441 kg), SpO2 92 %. Weight change:   Physical Exam: General:  Well appearing.  No resp difficulty HEENT: normal Neck: supple.  RIJ swan Carotids 2+ bilat; no bruits. No lymphadenopathy or thryomegaly appreciated. Cor: PMI nondisplaced. Regular.  No rubs, gallops or murmurs. Lungs: clear Abdomen: soft, nontender, nondistended. No hepatosplenomegaly. No bruits or masses. Good bowel sounds. Extremities: no cyanosis, clubbing, rash, edema no rash. + synovial thickening and RA changes Neuro: alert & orientedx3, cranial nerves  grossly intact. moves all 4 extremities w/o difficulty. Affect pleasant  Telemetry: NSR 60s Lab Results: Basic Metabolic Panel:  Recent Labs Lab 03/25/15 1052 03/26/15 03/27/15 0359  NA 134* 131* 137  K 4.5 4.4 4.4  CL 104 100* 105  CO2 21* 21* 23  GLUCOSE 166* 245* 227*  BUN 26* 19 18  CREATININE 1.23 1.03 0.88  CALCIUM 8.7* 8.2* 8.3*  MG  --   --  2.0   Liver Function Tests:  Recent Labs Lab 03/26/15  AST 15  ALT 15*  ALKPHOS 54  BILITOT 0.9  PROT 6.3*  ALBUMIN 2.3*   No results for input(s): LIPASE, AMYLASE in the last 168 hours. No results for input(s): AMMONIA in the last 168 hours. CBC:  Recent Labs Lab 03/25/15 1052 03/26/15 03/27/15 0359  WBC 12.3* 8.0 8.9  HGB 14.4 12.9* 13.2  HCT 43.5 39.6 39.8  MCV 96.5 95.9 94.1  PLT 403* 321 348   Cardiac Enzymes: No results for input(s): CKTOTAL, CKMB, CKMBINDEX, TROPONINI in the last 168 hours. BNP: Invalid input(s): POCBNP CBG:  Recent Labs Lab 03/28/15 0618  GLUCAP 175*   Microbiology: Lab Results  Component Value Date   CULT  03/25/2015    NO GROWTH 1 DAY Performed at [facility]     Recent  Labs Lab 03/25/15 1319  CULT NO GROWTH 1 DAYPerformed at [place], [facility]    Imaging: No results found.   ASSESSMENT:  1. Pericardial effusion, moderate   --no tamponade by [place] 7/22 2. Acute systolic HF   --EF ~44% 3. RA 4. DM2 with h/o osteomyelitis LLE 5. Chest pain     --CT 7/22 negative for PE or coronary calcifications    --Trop - x 1 6. Chronic LBBB 7. Hyponatremia 8. Atrial fibrillation with RVR  PLAN/DISCUSSION: He has moderate sized pericardial effusion with significant LV dysfunction and thickening of RV free wall. History is not consistent with pericarditis but might be masked by chronic steroids. Swan numbers not c/w with tamponade. He has not been on biologics due to financial concerns and thus has had persistent smoldering inflammation  (supported by CRP 29). He is in the process of getting 4 doses of high-dose solumedrol and then will switch back to oral prednisone 10. UA and HIV negative. PPD placed. ANA pending.   Per Dr Haroldine Laws. With chronic steroids would like to avoid taking him for window, if possible.   Etiology of LV dysfunction unclear. High-risk for CAD with RA but chest CT without significant coronary calcification. LHC this am. Followed by cMRI to further assess for scar and thickening of RV free wall.   7/24 DC-CV Maintaining NSR. Continue amio for 5 gram load. No anticoagulation with moderate effusion. On subcutaneous heparin.   DVT prophylaxis. Continue SCDs  Wife also says he appears to have severe OSA. Will need outpatient sleep study.     LOS: 3 days  CLEGG,AMY, NP 03/28/2015, 7:09 AM  Patient seen and examined with Darrick Grinder, NP. We discussed all aspects of the encounter. I agree with the assessment and plan as stated above.   Improved today. Maintaining NSR after DC-CV yesterday. Has received high-dose solumedrol. For cath this am then will need cMRI this afternoon or tomorrow. Continue amio load. HIV negative.   Citlally Captain,MD 8:25 AM

## 2015-03-28 NOTE — Progress Notes (Signed)
Deflated 3cc's air. Area bleeding. Added 3 cc's air. Will monitor closely

## 2015-03-28 NOTE — Interval H&P Note (Signed)
History and Physical Interval Note:  03/28/2015 9:23 AM  Ethan Alexander  has presented today for surgery, with the diagnosis of HF The various methods of treatment have been discussed with the patient and family. After consideration of risks, benefits and other options for treatment, the patient has consented to  Coronary angiography with possible coronary angioplasty as a surgical intervention .  The patient's history has been reviewed, patient examined, no change in status, stable for surgery.  I have reviewed the patient's chart and labs.  Questions were answered to the patient's satisfaction.     Taquana Bartley, Quillian Quince

## 2015-03-29 ENCOUNTER — Inpatient Hospital Stay (HOSPITAL_COMMUNITY): Payer: BLUE CROSS/BLUE SHIELD

## 2015-03-29 DIAGNOSIS — I319 Disease of pericardium, unspecified: Secondary | ICD-10-CM

## 2015-03-29 LAB — BASIC METABOLIC PANEL
ANION GAP: 6 (ref 5–15)
BUN: 19 mg/dL (ref 6–20)
CO2: 29 mmol/L (ref 22–32)
CREATININE: 1.15 mg/dL (ref 0.61–1.24)
Calcium: 8.6 mg/dL — ABNORMAL LOW (ref 8.9–10.3)
Chloride: 106 mmol/L (ref 101–111)
GFR calc Af Amer: >60 mL/min (ref >60)
GFR calc non Af Amer: >60 mL/min (ref >60)
GLUCOSE: 87 mg/dL (ref 65–99)
Potassium: 4.6 mmol/L (ref 3.5–5.1)
SODIUM: 141 mmol/L (ref 135–145)

## 2015-03-29 MED ORDER — AMIODARONE HCL 200 MG PO TABS
200.0000 mg | ORAL_TABLET | Freq: Two times a day (BID) | ORAL | Status: DC
  Administered 2015-03-30: 200 mg via ORAL
  Filled 2015-03-29 (×2): qty 1

## 2015-03-29 MED ORDER — LOSARTAN POTASSIUM 25 MG PO TABS
25.0000 mg | ORAL_TABLET | Freq: Every day | ORAL | Status: DC
  Administered 2015-03-29: 25 mg via ORAL
  Filled 2015-03-29 (×2): qty 1

## 2015-03-29 MED ORDER — AMIODARONE HCL 200 MG PO TABS
200.0000 mg | ORAL_TABLET | Freq: Once | ORAL | Status: DC
  Filled 2015-03-29: qty 1

## 2015-03-29 MED ORDER — GADOBENATE DIMEGLUMINE 529 MG/ML IV SOLN
30.0000 mL | Freq: Once | INTRAVENOUS | Status: AC
  Administered 2015-03-29: 30 mL via INTRAVENOUS

## 2015-03-29 MED ORDER — METOPROLOL TARTRATE 1 MG/ML IV SOLN
INTRAVENOUS | Status: AC
  Filled 2015-03-29: qty 15

## 2015-03-29 MED ORDER — CARVEDILOL 3.125 MG PO TABS
3.1250 mg | ORAL_TABLET | Freq: Two times a day (BID) | ORAL | Status: DC
  Administered 2015-03-29 – 2015-03-30 (×2): 3.125 mg via ORAL
  Filled 2015-03-29 (×4): qty 1

## 2015-03-29 MED ORDER — AMIODARONE HCL 200 MG PO TABS: 200.0000 mg | ORAL_TABLET | Freq: Two times a day (BID) | ORAL | Status: DC

## 2015-03-29 MED ORDER — AMIODARONE HCL 200 MG PO TABS
200.0000 mg | ORAL_TABLET | Freq: Once | ORAL | Status: AC
  Administered 2015-03-29: 200 mg via ORAL
  Filled 2015-03-29: qty 1

## 2015-03-29 NOTE — Progress Notes (Signed)
Provider informed that patient had multiple apneic episodes with sats in 70s associated with presyncopal feeling

## 2015-03-29 NOTE — Progress Notes (Signed)
Subjective:   64 y/o with RA (on chronic prednisone - followed by Dr. Ouida Sills), DM2, LBBB admitted with CP and found to have moderate pericardial effusion, with thickened RV free wall and EF 30%. CT negative for PE.   Taken to cath lab on 7/22 for RHC.  No evidence of tamponade RA 9 RV 33/6 (21) PCWP 9 Thermo 5.8/2.5 Fick 4.2/1.8  Events 7/24 DC-CV  --> NSR  7/25 LHC Normal Cors  Over night he had 2 episodes of apnea and hypoxia with saturations in 70s. Denies SOB.    cMRI EF 36% mild hyperenhancement suggestive of myocarditis. Moderate pericardial effusion   .   Intake/Output Summary (Last 24 hours) at 03/29/15 0713 Last data filed at 03/29/15 0600  Gross per 24 hour  Intake 1054.1 ml  Output   1555 ml  Net -500.9 ml    Current meds: . cholecalciferol  1,000 Units Oral Daily  . colchicine  0.6 mg Oral BID  . docusate sodium  100 mg Oral BID  . folic acid  1 mg Oral Daily  . heparin  5,000 Units Subcutaneous 3 times per day  . ondansetron  4 mg Oral BID  . pantoprazole  40 mg Oral Daily  . predniSONE  10 mg Oral Daily  . sodium chloride  3 mL Intravenous Q12H  . sodium chloride  3 mL Intravenous Q12H  . sodium chloride  3 mL Intravenous Q12H   Infusions: . sodium chloride Stopped (03/26/15 0800)  . sodium chloride 500 mL (03/26/15 2234)  . amiodarone 30 mg/hr (03/29/15 0300)     Objective:  Blood pressure 139/80, pulse 53, temperature 98.9 F (37.2 C), temperature source Oral, resp. rate 12, height 6\' 6"  (1.981 m), weight 206 lb (93.441 kg), SpO2 97 %. Weight change:   Physical Exam: General:  Well appearing.  No resp difficulty. In bed.  HEENT: normal Neck: supple.  Carotids 2+ bilat; no bruits. No lymphadenopathy or thryomegaly appreciated. Cor: PMI nondisplaced. Regular.  No rubs, gallops or murmurs. Lungs: clear Abdomen: soft, nontender, nondistended. No hepatosplenomegaly. No bruits or masses. Good bowel sounds. Extremities: no cyanosis, clubbing,  rash, edema no rash. + synovial thickening and RA changes Neuro: alert & orientedx3, cranial nerves grossly intact. moves all 4 extremities w/o difficulty. Affect pleasant  Telemetry: Sinus brady 50s  Lab Results: Basic Metabolic Panel:  Recent Labs Lab 03/25/15 1052 03/26/15 03/27/15 0359 03/28/15 1202  NA 134* 131* 137  --   K 4.5 4.4 4.4  --   CL 104 100* 105  --   CO2 21* 21* 23  --   GLUCOSE 166* 245* 227*  --   BUN 26* 19 18  --   CREATININE 1.23 1.03 0.88 1.06  CALCIUM 8.7* 8.2* 8.3*  --   MG  --   --  2.0  --    Liver Function Tests:  Recent Labs Lab 03/26/15  AST 15  ALT 15*  ALKPHOS 54  BILITOT 0.9  PROT 6.3*  ALBUMIN 2.3*   No results for input(s): LIPASE, AMYLASE in the last 168 hours. No results for input(s): AMMONIA in the last 168 hours. CBC:  Recent Labs Lab 03/25/15 1052 03/26/15 03/27/15 0359 03/28/15 1202  WBC 12.3* 8.0 8.9 12.0*  HGB 14.4 12.9* 13.2 13.4  HCT 43.5 39.6 39.8 40.9  MCV 96.5 95.9 94.1 95.3  PLT 403* 321 348 360   Cardiac Enzymes: No results for input(s): CKTOTAL, CKMB, CKMBINDEX, TROPONINI in the last 168 hours.  BNP: Invalid input(s): POCBNP CBG:  Recent Labs Lab 03/28/15 0618  GLUCAP 175*   Microbiology: Lab Results  Component Value Date   CULT  03/25/2015    NO GROWTH 1 DAY Performed at [facility] 03/25/15 1319  CULT NO GROWTH 1 DAYPerformed at [place], [facility]AN CATCH    Imaging: No results found.   ASSESSMENT:  1. Pericardial effusion, moderate   --no tamponade by [place] 7/22 2. Acute systolic HF   --EF ~32-91% 7/22 - LHC normal cors.  3. RA 4. DM2 with h/o osteomyelitis LLE 5. Chest pain     --CT 7/22 negative for PE or coronary calcifications    --Trop - x 1 6. Chronic LBBB 7. Hyponatremia 8. Atrial fibrillation with RVR 9. Hypoxia 70s 7/26--> ? Sleep Apnea   PLAN/DISCUSSION: He has moderate sized pericardial effusion with significant LV  dysfunction and thickening of RV free wall. History is not consistent with pericarditis but might be masked by chronic steroids. Swan numbers not c/w with tamponade. He has not been on biologics due to financial concerns and thus has had persistent smoldering inflammation (supported by CRP 29). He is in the process of getting 4 doses of high-dose solumedrol and then will switch back to oral prednisone 10. UA and HIV negative. PPD placed. ANA pending.   Per Dr Haroldine Laws. With chronic steroids would like to avoid taking him for window, if possible.   Etiology of LV dysfunction unclear. High-risk for CAD with RA but chest CT without significant coronary calcification. LHC with normal cors. cMRI this morning.  Volume status stable. BMET pending. Consider adding lisinopril after lab results.   7/24 DC-CV Maintaining NSR. Continue amio for 5 gram load. No anticoagulation with moderate effusion. On subcutaneous heparin.   DVT prophylaxis. Continue SCDs  Had apneic episode last night with sats in 70s and apnea. Will ask pulmonary to evaluate for CPAP prior to discharge.    Consult cardiac rehab.   LOS: 4 days  CLEGG,AMY, NP 03/29/2015, 7:13 AM   Patient seen and examined with Darrick Grinder, NP. We discussed all aspects of the encounter. I agree with the assessment and plan as stated above.   Doing well. cMRI results reviewed with him. Suspect cardiomyopathy due to combination of OSA and RA. He also has moderate pericardial effusion. Will treat with b-blocker, ACE/ARB. Will likely need higher dose steroids for a while. Will d/w with Rheum.  Plan: 1) Change amio to 200 po bid 2) Start carvedilol 3.125 bid 3) Losartan 25 daily 4) Overnight oximetry 5) Discuss steroid dose with rheum 6) Transfer to stepdown 7) Possibly home tomorrow  Bensimhon, Daniel,MD 12:50 PM

## 2015-03-30 ENCOUNTER — Telehealth: Payer: Self-pay | Admitting: Nurse Practitioner

## 2015-03-30 ENCOUNTER — Other Ambulatory Visit: Payer: Self-pay | Admitting: Nurse Practitioner

## 2015-03-30 LAB — BASIC METABOLIC PANEL
Anion gap: 7 (ref 5–15)
BUN: 15 mg/dL (ref 6–20)
CALCIUM: 8.5 mg/dL — AB (ref 8.9–10.3)
CHLORIDE: 104 mmol/L (ref 101–111)
CO2: 28 mmol/L (ref 22–32)
Creatinine, Ser: 1.13 mg/dL (ref 0.61–1.24)
Glucose, Bld: 102 mg/dL — ABNORMAL HIGH (ref 65–99)
Potassium: 4.1 mmol/L (ref 3.5–5.1)
Sodium: 139 mmol/L (ref 135–145)

## 2015-03-30 LAB — ANCA TITERS: P-ANCA: <1:20 {titer}

## 2015-03-30 MED ORDER — AMIODARONE HCL 200 MG PO TABS
200.0000 mg | ORAL_TABLET | Freq: Two times a day (BID) | ORAL | Status: DC
Start: 2015-03-30 — End: 2015-04-06

## 2015-03-30 MED ORDER — SACUBITRIL-VALSARTAN 24-26 MG PO TABS: 1.0000 | ORAL_TABLET | Freq: Two times a day (BID) | ORAL | Status: DC

## 2015-03-30 MED ORDER — PREDNISONE 10 MG PO TABS: 60.0000 mg | ORAL_TABLET | Freq: Every day | ORAL | Status: DC

## 2015-03-30 MED ORDER — CARVEDILOL 3.125 MG PO TABS: 3.1250 mg | ORAL_TABLET | Freq: Two times a day (BID) | ORAL | Status: DC

## 2015-03-30 MED ORDER — SACUBITRIL-VALSARTAN 24-26 MG PO TABS
1.0000 | ORAL_TABLET | Freq: Two times a day (BID) | ORAL | Status: DC
  Administered 2015-03-30: 1 via ORAL
  Filled 2015-03-30 (×2): qty 1

## 2015-03-30 NOTE — Care Management Note (Addendum)
Case Management Note  Patient Details  Name: Ethan Alexander MRN: [id number] Date of Birth: 1951/04/23  Subjective/Objective:           Adm w pericarditis         Action/Plan: lives w wife, pcp dr Alyson Ingles   Expected Discharge Date:    03/30/15              Expected Discharge Plan:  Home/Self Care  In-House Referral:     Discharge planning Services  CM Consult, Medication Assistance  Post Acute Care Choice:    Choice offered to:     DME Arranged:    DME Agency:     HH Arranged:    [place] Agency:     Status of Service:  Completed, signed off  Medicare Important Message Given:    Date Medicare IM Given:    Medicare IM give by:    Date Additional Medicare IM Given:    Additional Medicare Important Message give by:     If discussed at [place] of Stay Meetings, dates discussed:  03/31/15  Additional Comments: gave pt 30day free and 10.00 per month copay card for entresto. Home oxygen order noted in computer. Alerted jermaine w [facility] that pt dc and needs home o2.  Lacretia Leigh, RN 03/30/2015, 9:16 AM

## 2015-03-30 NOTE — Progress Notes (Signed)
Subjective:   64 y/o with RA (on chronic prednisone - followed by Dr. Ouida Sills), DM2, LBBB admitted with CP and found to have moderate pericardial effusion, with thickened RV free wall and EF 30%. CT negative for PE.   Taken to cath lab on 7/22 for RHC.  No evidence of tamponade RA 9 RV 33/6 (21) PCWP 9 Thermo 5.8/2.5 Fick 4.2/1.8  Events 7/24 DC-CV  --> NSR  7/25 LHC Normal Cors  Yesterday he was started on carvedilol and losartan. Over night he had 1  episodes of apnea. On 2 liters at night. No desaturation noted.    Denies SOB.    cMRI EF 36% mild hyperenhancement suggestive of myocarditis. Moderate pericardial effusion   .   Intake/Output Summary (Last 24 hours) at 03/30/15 0714 Last data filed at 03/30/15 0300  Gross per 24 hour  Intake 1383.97 ml  Output   1800 ml  Net -416.03 ml    Current meds: . amiodarone  200 mg Oral BID  . carvedilol  3.125 mg Oral BID WC  . cholecalciferol  1,000 Units Oral Daily  . colchicine  0.6 mg Oral BID  . docusate sodium  100 mg Oral BID  . folic acid  1 mg Oral Daily  . heparin  5,000 Units Subcutaneous 3 times per day  . losartan  25 mg Oral Daily  . ondansetron  4 mg Oral BID  . pantoprazole  40 mg Oral Daily  . predniSONE  10 mg Oral Daily  . sodium chloride  3 mL Intravenous Q12H  . sodium chloride  3 mL Intravenous Q12H   Infusions:     Objective:  Blood pressure 147/86, pulse 70, temperature 98 F (36.7 C), temperature source Oral, resp. rate 17, height 6\' 6"  (1.981 m), weight 206 lb (93.441 kg), SpO2 96 %. Weight change:   Physical Exam: General:  Well appearing.  No resp difficulty. In bed.  HEENT: normal Neck: supple.  Carotids 2+ bilat; no bruits. No lymphadenopathy or thryomegaly appreciated. Cor: PMI nondisplaced. Regular.  No rubs, gallops or murmurs. Lungs: clear Abdomen: soft, nontender, nondistended. No hepatosplenomegaly. No bruits or masses. Good bowel sounds. Extremities: no cyanosis, clubbing,  rash, edema no rash. + synovial thickening and RA changes Neuro: alert & orientedx3, cranial nerves grossly intact. moves all 4 extremities w/o difficulty. Affect pleasant  Telemetry: Sinus brady 50s  Lab Results: Basic Metabolic Panel:  Recent Labs Lab 03/25/15 1052 03/26/15 03/27/15 0359 03/28/15 1202 03/29/15 1015 03/30/15 0243  NA 134* 131* 137  --  141 139  K 4.5 4.4 4.4  --  4.6 4.1  CL 104 100* 105  --  106 104  CO2 21* 21* 23  --  29 28  GLUCOSE 166* 245* 227*  --  87 102*  BUN 26* 19 18  --  19 15  CREATININE 1.23 1.03 0.88 1.06 1.15 1.13  CALCIUM 8.7* 8.2* 8.3*  --  8.6* 8.5*  MG  --   --  2.0  --   --   --    Liver Function Tests:  Recent Labs Lab 03/26/15  AST 15  ALT 15*  ALKPHOS 54  BILITOT 0.9  PROT 6.3*  ALBUMIN 2.3*   No results for input(s): LIPASE, AMYLASE in the last 168 hours. No results for input(s): AMMONIA in the last 168 hours. CBC:  Recent Labs Lab 03/25/15 1052 03/26/15 03/27/15 0359 03/28/15 1202  WBC 12.3* 8.0 8.9 12.0*  HGB 14.4 12.9* 13.2 13.4  HCT 43.5 39.6 39.8 40.9  MCV 96.5 95.9 94.1 95.3  PLT 403* 321 348 360   Cardiac Enzymes: No results for input(s): CKTOTAL, CKMB, CKMBINDEX, TROPONINI in the last 168 hours. BNP: Invalid input(s): POCBNP CBG:  Recent Labs Lab 03/28/15 0618  GLUCAP 175*   Microbiology: Lab Results  Component Value Date   CULT  03/25/2015    NO GROWTH 1 DAY Performed at [facility] 03/25/15 1319  CULT NO GROWTH 1 DAYPerformed at [facility], CLEAN CATCH    Imaging: Mr Card Morphology Wo/w Cm  03/29/2015   CLINICAL DATA:  DCM?  Pericardial Disease  EXAM: CARDIAC MRI  TECHNIQUE: The patient was scanned on a 1.5 Tesla GE magnet. A dedicated cardiac coil was used. Functional imaging was done using Fiesta sequences. 2,3, and 4 chamber views were done to assess for RWMA's. Modified Simpson's rule using a short axis stack was used to calculate an  ejection fraction on a dedicated work Conservation officer, nature. The patient received 25 cc of Multihance. After 10 minutes inversion recovery sequences were used to assess for infiltration and scar tissue.  CONTRAST:  25 cc Multihance  FINDINGS: The LA/RA and RV were normal in size and function. The LV was severely dilated. There was dysynergic septal motion. The quantitative EF was 36% (EDV 242 cc ESV 154 cc SV 87 cc)  There was no signficant valve disease. There was a moderate circumferential pericardial effusion. There did not appear to be diastolic RV collpase. The RV free wall did not appear thickened  Delayed enhancement images showed mild subepicardial uptake in the inferior apex and the mid septum.  IMPRESSION: 1) Severe LVE with dysynergic septal motion EF 36%  2) Gadolinium uptake in mid septum and subepicardial inferior apex  3) Moderate circumferential pericardial effusion no evidence of tamponade  Jenkins Rouge   Electronically Signed   By: Jenkins Rouge M.D.   On: 03/29/2015 11:28     ASSESSMENT:  1. Pericardial effusion, moderate   --no tamponade by [place] 7/22 2. Acute systolic HF   --EF ~30-16% 7/22 - LHC normal cors.  3. RA 4. DM2 with h/o osteomyelitis LLE 5. Chest pain     --CT 7/22 negative for PE or coronary calcifications    --Trop - x 1 6. Chronic LBBB 7. Hyponatremia 8. Atrial fibrillation with RVR 9. Hypoxia 70s 7/26--> ? Sleep Apnea   PLAN/DISCUSSION: He has moderate sized pericardial effusion with significant LV dysfunction and thickening of RV free wall. History is not consistent with pericarditis but might be masked by chronic steroids. Swan numbers not c/w with tamponade. He has not been on biologics due to financial concerns and thus has had persistent smoldering inflammation (supported by CRP 29). He is in the process of getting 4 doses of high-dose solumedrol and then will switch back to oral prednisone 10. UA and HIV negative. PPD placed. ANA pending.   Per  Dr Haroldine Laws. With chronic steroids would like to avoid taking him for window, if possible.   Etiology of LV dysfunction unclear. High-risk for CAD with RA but chest CT without significant coronary calcification. LHC with normal cors. cMRI this morning.  Volume status stable. BMET pending. Consider adding lisinopril after lab results.   7/24 DC-CV Maintaining NSR. Continue amio for 5 gram load. No anticoagulation with moderate effusion. On subcutaneous heparin.   DVT prophylaxis. Continue SCDs  Had apneic episode last night with  sats in 70s and apnea. Will ask pulmonary to evaluate for CPAP prior to discharge.    Consult cardiac rehab.   LOS: 5 days  CLEGG,AMY, NP 03/30/2015, 7:14 AM   Patient seen and examined with Darrick Grinder, NP. We discussed all aspects of the encounter. I agree with the assessment and plan as stated above.   Doing well. cMRI results reviewed with him. Suspect cardiomyopathy due to combination of OSA and RA. He also has moderate pericardial effusion. Will treat with b-blocker, ACE/ARB. Will likely need higher dose steroids for a while. Will d/w with Rheum.  Plan: 1) Change amio to 200 po bid 2) Start carvedilol 3.125 bid 3) Losartan 25 daily 4) Overnight oximetry 5) Discuss steroid dose with rheum 6) Transfer to stepdown 7) Possibly home tomorrow  CLEGG,AMY,MD 7:14 AM        Subjective:   64 y/o with RA (on chronic prednisone - followed by Dr. Ouida Sills), DM2, LBBB admitted with CP and found to have moderate pericardial effusion, with thickened RV free wall and EF 30%. CT negative for PE.   Taken to cath lab on 7/22 for RHC.  No evidence of tamponade RA 9 RV 33/6 (21) PCWP 9 Thermo 5.8/2.5 Fick 4.2/1.8  Events 7/24 DC-CV  --> NSR  7/25 LHC Normal Cors  Over night he had 2 episodes of apnea and hypoxia with saturations in 70s. Denies SOB.    cMRI EF 36% mild hyperenhancement suggestive of myocarditis. Moderate pericardial effusion   .    Intake/Output Summary (Last 24 hours) at 03/30/15 0715 Last data filed at 03/30/15 0300  Gross per 24 hour  Intake 1383.97 ml  Output   1800 ml  Net -416.03 ml    Current meds: . amiodarone  200 mg Oral BID  . carvedilol  3.125 mg Oral BID WC  . cholecalciferol  1,000 Units Oral Daily  . colchicine  0.6 mg Oral BID  . docusate sodium  100 mg Oral BID  . folic acid  1 mg Oral Daily  . heparin  5,000 Units Subcutaneous 3 times per day  . losartan  25 mg Oral Daily  . ondansetron  4 mg Oral BID  . pantoprazole  40 mg Oral Daily  . predniSONE  10 mg Oral Daily  . sodium chloride  3 mL Intravenous Q12H  . sodium chloride  3 mL Intravenous Q12H   Infusions:     Objective:  Blood pressure 147/86, pulse 70, temperature 98 F (36.7 C), temperature source Oral, resp. rate 17, height 6\' 6"  (1.981 m), weight 206 lb (93.441 kg), SpO2 96 %. Weight change:   Physical Exam: General:  Well appearing.  No resp difficulty. In bed.  HEENT: normal Neck: supple.  Carotids 2+ bilat; no bruits. No lymphadenopathy or thryomegaly appreciated. Cor: PMI nondisplaced. Regular.  No rubs, gallops or murmurs. Lungs: clear Abdomen: soft, nontender, nondistended. No hepatosplenomegaly. No bruits or masses. Good bowel sounds. Extremities: no cyanosis, clubbing, rash, edema no rash. + synovial thickening and RA changes Neuro: alert & orientedx3, cranial nerves grossly intact. moves all 4 extremities w/o difficulty. Affect pleasant  Telemetry: Sinus Rhythm 60s   Lab Results: Basic Metabolic Panel:  Recent Labs Lab 03/25/15 1052 03/26/15 03/27/15 0359 03/28/15 1202 03/29/15 1015 03/30/15 0243  NA 134* 131* 137  --  141 139  K 4.5 4.4 4.4  --  4.6 4.1  CL 104 100* 105  --  106 104  CO2 21* 21* 23  --  29 28  GLUCOSE 166* 245* 227*  --  87 102*  BUN 26* 19 18  --  19 15  CREATININE 1.23 1.03 0.88 1.06 1.15 1.13  CALCIUM 8.7* 8.2* 8.3*  --  8.6* 8.5*  MG  --   --  2.0  --   --   --    Liver  Function Tests:  Recent Labs Lab 03/26/15  AST 15  ALT 15*  ALKPHOS 54  BILITOT 0.9  PROT 6.3*  ALBUMIN 2.3*   No results for input(s): LIPASE, AMYLASE in the last 168 hours. No results for input(s): AMMONIA in the last 168 hours. CBC:  Recent Labs Lab 03/25/15 1052 03/26/15 03/27/15 0359 03/28/15 1202  WBC 12.3* 8.0 8.9 12.0*  HGB 14.4 12.9* 13.2 13.4  HCT 43.5 39.6 39.8 40.9  MCV 96.5 95.9 94.1 95.3  PLT 403* 321 348 360   Cardiac Enzymes: No results for input(s): CKTOTAL, CKMB, CKMBINDEX, TROPONINI in the last 168 hours. BNP: Invalid input(s): POCBNP CBG:  Recent Labs Lab 03/28/15 0618  GLUCAP 175*   Microbiology: Lab Results  Component Value Date   CULT  03/25/2015    NO GROWTH 1 DAY Performed at [facility] 03/25/15 1319  CULT NO GROWTH 1 DAYPerformed at [facility], CLEAN CATCH    Imaging: Mr Card Morphology Wo/w Cm  03/29/2015   CLINICAL DATA:  DCM?  Pericardial Disease  EXAM: CARDIAC MRI  TECHNIQUE: The patient was scanned on a 1.5 Tesla GE magnet. A dedicated cardiac coil was used. Functional imaging was done using Fiesta sequences. 2,3, and 4 chamber views were done to assess for RWMA's. Modified Simpson's rule using a short axis stack was used to calculate an ejection fraction on a dedicated work Conservation officer, nature. The patient received 25 cc of Multihance. After 10 minutes inversion recovery sequences were used to assess for infiltration and scar tissue.  CONTRAST:  25 cc Multihance  FINDINGS: The LA/RA and RV were normal in size and function. The LV was severely dilated. There was dysynergic septal motion. The quantitative EF was 36% (EDV 242 cc ESV 154 cc SV 87 cc)  There was no signficant valve disease. There was a moderate circumferential pericardial effusion. There did not appear to be diastolic RV collpase. The RV free wall did not appear thickened  Delayed enhancement images showed  mild subepicardial uptake in the inferior apex and the mid septum.  IMPRESSION: 1) Severe LVE with dysynergic septal motion EF 36%  2) Gadolinium uptake in mid septum and subepicardial inferior apex  3) Moderate circumferential pericardial effusion no evidence of tamponade  Jenkins Rouge   Electronically Signed   By: Jenkins Rouge M.D.   On: 03/29/2015 11:28     ASSESSMENT:  1. Pericardial effusion, moderate   --no tamponade by [place] 7/22 2. Acute systolic HF   --EF ~10-17% 7/22 - LHC normal cors.  3. RA 4. DM2 with h/o osteomyelitis LLE 5. Chest pain     --CT 7/22 negative for PE or coronary calcifications    --Trop - x 1 6. Chronic LBBB 7. Hyponatremia 8. Atrial fibrillation with RVR 9. Hypoxia 70s 7/26--> ? Sleep Apnea   PLAN/DISCUSSION: He has moderate sized pericardial effusion with significant LV dysfunction and thickening of RV free wall. History is not consistent with pericarditis but might be masked by chronic steroids. Swan numbers not c/w with tamponade. He has not been on  biologics due to financial concerns and thus has had persistent smoldering inflammation (supported by CRP 29). He is in the process of getting 4 doses of high-dose solumedrol and then will switch back to oral prednisone 10. UA and HIV negative. PPD placed. ANA pending.   Moderate pericardial effusion . Started on low dose carvedilol and losartan. Stop losartan and add 24-26 mg entresto twice a day. Renal function stable. Follow up next week in HF clinic with BMET   Per Dr Haroldine Laws. With chronic steroids would like to avoid taking him for window, if possible.   Etiology of LV dysfunction unclear. High-risk for CAD with RA but chest CT without significant coronary calcification. LHC with normal cors. cMRI EF 36% mild hyperenhancement suggestive of myocarditis.  7/24 DC-CV Maintaining NSR. On po amiodarone. No anticoagulation with moderate effusion. On subcutaneous heparin.   Had apneic episode last night but  sats > 90s with 2 liters oxygen.   Set home oxygen for bed time.   Home meds carveidlol 3.125 mg twice a day entresto 24-26 mg twice a day   LOS: 5 days  CLEGG,AMY, NP 03/30/2015, 7:15 AM   Patient seen and examined with Darrick Grinder, NP. We discussed all aspects of the encounter. I agree with the assessment and plan as stated above.   Can go home today with close f/u. I discussed the case with Dr. Ouida Sills and will use prednisone 60mg  daily and continue MTX.  Cardiac meds:  Entresto 24/26 Carvedilol 3.125 bid Amio 200 bid (no anti-coagulation yet with inflammatory effusion) Spiro 12.5 daily  F/u in HF Clinic 1 week. Will need to arrange f/u with Rheumatology. Echo in 3-4 weeks.  Outpatient sleep study  Delcenia Inman,MD 8:12 AM

## 2015-03-30 NOTE — Discharge Summary (Signed)
Advanced Heart Failure Team  Discharge Summary   Patient ID: Ethan Alexander MRN: [id number], DOB/AGE: 11/11/1950 64 y.o. Admit date: 03/25/2015 D/C date:     03/30/2015   Primary Discharge Diagnoses:  1. Pericardial effusion, moderate  --no tamponade by [place] 7/22  2. Acute systolic HF  --EF ~86-76% 7/22  - 03/28/15 LHC normal cors.  3. RA  4. DM2 with h/o osteomyelitis LLE  5. Chest pain  --CT 7/22 negative for PE or coronary calcifications  --Trop - x 1  6. Chronic LBBB  7. Hyponatremia  8. Atrial fibrillation with RVR --> DC-CV 7/24. On amio 200 mg twice a day. No anticoagulation with pericardial effusion. 9. Hypoxia 70s on 7/26 during the night-> ? Sleep Apnea . Home oxygen and outpatient sleep study arranged.    Hospital Course:   64 y/o with RA (on chronic prednisone - followed by Dr. Ouida Alexander), DM2, LBBB admitted with CP and found to have moderate pericardial effusion, with thickened RV free wall and EF 30%. CT of chest negative for PE. Troponin was negative. He was taken to cath lab on July 22nd and there no evidence of tamponade. Ethan Alexander catheter remained in place to guide therapy. On July 24 he developed A fib RVR and was started on amio + cardizem drip.  He was later cardioverted.on July 24th and maintained a NSR and will continue on oral amiodarone. Due significant pericardial effusion he was not placed on anticoagulant. He was not a candidate pericardial window due to chronic steroids.   The etiology of LV dysfunction was unclear so he had LHC on that showed normal coronaries. Periods of apnea noted on July 26th with oxygen saturations down to the 70s. Home oxygen was arranged for night time. As he continued to improve swan ganz catheter was removed. He was placed on carvedilol, entresto, and spiro.    He will continue to be followed closely in the HF clinic and has follow up next week. Plan to set up outpatient sleep study at that time.    Procedures RHC 03/25/2015 RA 9   RV 33/6 (21)  PCWP 9  Thermo 5.8/2.5  Fick 4.2/1.8  LHC 03/28/2015--> normal cors   DC-CV 03/27/2015   03/29/2015 cMRI EF 36% mild hyperenhancement suggestive of myocarditis. Moderate pericardial effusion   Discharge Weight Range: 206 pounds  Discharge Vitals: Blood pressure 147/77, pulse 75, temperature 97.9 F (36.6 C), temperature source Oral, resp. rate 23, height 6\' 6"  (1.981 m), weight 206 lb (93.441 kg), SpO2 95 %.  Labs: Lab Results  Component Value Date   WBC 12.0* 03/28/2015   HGB 13.4 03/28/2015   HCT 40.9 03/28/2015   MCV 95.3 03/28/2015   PLT 360 03/28/2015    Recent Labs Lab 03/26/15  03/30/15 0243  NA 131*  < > 139  K 4.4  < > 4.1  CL 100*  < > 104  CO2 21*  < > 28  BUN 19  < > 15  CREATININE 1.03  < > 1.13  CALCIUM 8.2*  < > 8.5*  PROT 6.3*  --   --   BILITOT 0.9  --   --   ALKPHOS 54  --   --   ALT 15*  --   --   AST 15  --   --   GLUCOSE 245*  < > 102*  < > = values in this interval not displayed. No results found for: CHOL, HDL, LDLCALC, TRIG BNP (last 3 results) No  results for input(s): BNP in the last 8760 hours.  ProBNP (last 3 results) No results for input(s): PROBNP in the last 8760 hours.   Diagnostic Studies/Procedures   Mr Card Morphology Wo/w Cm  03/29/2015   CLINICAL DATA:  DCM?  Pericardial Disease  EXAM: CARDIAC MRI  TECHNIQUE: The patient was scanned on a 1.5 Tesla GE magnet. A dedicated cardiac coil was used. Functional imaging was done using Fiesta sequences. 2,3, and 4 chamber views were done to assess for RWMA's. Modified Simpson's rule using a short axis stack was used to calculate an ejection fraction on a dedicated work Conservation officer, nature. The patient received 25 cc of Multihance. After 10 minutes inversion recovery sequences were used to assess for infiltration and scar tissue.  CONTRAST:  25 cc Multihance  FINDINGS: The LA/RA and RV were normal in size and function. The LV was severely dilated. There was dysynergic  septal motion. The quantitative EF was 36% (EDV 242 cc ESV 154 cc SV 87 cc)  There was no signficant valve disease. There was a moderate circumferential pericardial effusion. There did not appear to be diastolic RV collpase. The RV free wall did not appear thickened  Delayed enhancement images showed mild subepicardial uptake in the inferior apex and the mid septum.  IMPRESSION: 1) Severe LVE with dysynergic septal motion EF 36%  2) Gadolinium uptake in mid septum and subepicardial inferior apex  3) Moderate circumferential pericardial effusion no evidence of tamponade  Ethan Alexander   Electronically Signed   By: Ethan Alexander M.D.   On: 03/29/2015 11:28    Discharge Medications     Medication List    STOP taking these medications        RA NAPROXEN SODIUM 220 MG tablet  Generic drug:  naproxen sodium     ramipril 5 MG tablet  Commonly known as:  ALTACE     rivaroxaban 10 MG Tabs tablet  Commonly known as:  XARELTO      TAKE these medications        amiodarone 200 MG tablet  Commonly known as:  PACERONE  Take 1 tablet (200 mg total) by mouth 2 (two) times daily.     aspirin 81 MG tablet  Take 81 mg by mouth daily as needed for pain.     carvedilol 3.125 MG tablet  Commonly known as:  COREG  Take 1 tablet (3.125 mg total) by mouth 2 (two) times daily with a meal.     docusate sodium 100 MG capsule  Commonly known as:  COLACE  Take 100 mg by mouth 2 (two) times daily.     folic acid 1 MG tablet  Commonly known as:  FOLVITE  Take 1 mg by mouth daily.     HYDROcodone-acetaminophen 10-325 MG per tablet  Commonly known as:  NORCO  Take 1 tablet by mouth every 6 (six) hours as needed. for pain     methocarbamol 500 MG tablet  Commonly known as:  ROBAXIN  Take 1 tablet (500 mg total) by mouth every 6 (six) hours as needed.     methotrexate (PF) 50 MG/2ML injection  Inject 0.8 mLs as directed once a week. Inject 0.8 mLs as directed once a week.     ondansetron 4 MG  disintegrating tablet  Commonly known as:  ZOFRAN-ODT  Take 4 mg by mouth 2 (two) times daily.     oxyCODONE 5 MG immediate release tablet  Commonly known as:  Oxy IR/ROXICODONE  Take 1-2 tablets (5-10 mg total) by mouth every 3 (three) hours as needed.     pantoprazole 40 MG tablet  Commonly known as:  PROTONIX  Take 40 mg by mouth daily.     predniSONE 10 MG tablet  Commonly known as:  DELTASONE  Take 10 mg by mouth daily. Take 1 10mg  tablet daily     sacubitril-valsartan 24-26 MG  Commonly known as:  ENTRESTO  Take 1 tablet by mouth 2 (two) times daily.     traMADol 50 MG tablet  Commonly known as:  ULTRAM  Take 50 mg by mouth daily as needed. Take 1 50mg  tablet daily as needed     Vitamin D (Ergocalciferol) 50000 UNITS Caps capsule  Commonly known as:  DRISDOL  Take 1,000 Units by mouth daily.        Disposition   The patient will be discharged in stable condition to home. Discharge Instructions    ACE Inhibitor / ARB already ordered    Complete by:  As directed      ACE Inhibitor / ARB already ordered    Complete by:  As directed      Diet - low sodium heart healthy    Complete by:  As directed      Diet - low sodium heart healthy    Complete by:  As directed      Heart Failure patients record your daily weight using the same scale at the same time of day    Complete by:  As directed      Increase activity slowly    Complete by:  As directed      Increase activity slowly    Complete by:  As directed           Follow-up Information    Follow up with Glori Bickers, MD On 04/06/2015.   Specialty:  Cardiology   Why:  at 1030 for hospital follow up.  Please bring all of your medications with you to your appointment.  Patient parking code is Corporate investment banker information:   [place] [address] [phone]         Duration of Discharge Encounter: Greater than 35 minutes   Signed, CLEGG,AMY NP-C  03/30/2015, 11:48  AM  Patient seen and examined with Darrick Grinder, NP. We discussed all aspects of the encounter. I agree with the assessment and plan as stated above.   Pericardial effusion likely due to active RA. No evidence of tamponade. Discussed with Dr. Ouida Alexander. Will increase prednisone to 60 daily and hopefully can start biologics soon.   Has NICM with EF 36% by cMRI. Possibly related to undiagnosed OSA. Sleep study pending.   PAF controlled on amio. No anticoagulation due to pericardial effusion at this point.   Juanantonio Stolar,MD 12:07 AM

## 2015-03-30 NOTE — Telephone Encounter (Signed)
   Pts wife called this evening stating that he was not provided Rx for higher dose of prednisone upon d/c today.  D/c'd home on 60 mg daily until he can get into see Rheum.  I reviewed chart.  Pt has 10mg  tabs @ home but will run out sooner than anticipated and would not be able to refill per wife.  I sent in Rx for prednisone 10 mg, 6 tabs daily, #42 with 1 refill.  He will be seen in CHF clinic on 8/3, @ which point it could be refilled if a longer course/taper is required.  Pts wife verbalized understanding and was grateful for the call back.  Murray Hodgkins, NP 03/30/2015, 7:34 PM

## 2015-03-30 NOTE — Progress Notes (Signed)
SATURATION QUALIFICATIONS: (This note is used to comply with regulatory documentation for home oxygen)  Patient Saturations on Room Air at Rest =95%  Patient Saturations on Room Air while Ambulating = 88%  Patient Saturations on 2 Liters of oxygen while Ambulating = 90  Please briefly explain why patient needs home oxygen:

## 2015-04-06 ENCOUNTER — Other Ambulatory Visit: Payer: Self-pay

## 2015-04-06 ENCOUNTER — Ambulatory Visit (HOSPITAL_COMMUNITY)
Admit: 2015-04-06 | Discharge: 2015-04-06 | Disposition: A | Discharge status: Home / Self Care | Payer: BLUE CROSS/BLUE SHIELD | Source: Ambulatory Visit | Attending: Internal Medicine | Admitting: Internal Medicine

## 2015-04-06 ENCOUNTER — Encounter (HOSPITAL_COMMUNITY): Payer: Self-pay

## 2015-04-06 VITALS — BP 84/64 | HR 64 | Resp 20 | Wt 205.0 lb

## 2015-04-06 DIAGNOSIS — R0902 Hypoxemia: Secondary | ICD-10-CM | POA: Diagnosis not present

## 2015-04-06 DIAGNOSIS — R0683 Snoring: Secondary | ICD-10-CM

## 2015-04-06 DIAGNOSIS — I4891 Unspecified atrial fibrillation: Secondary | ICD-10-CM | POA: Insufficient documentation

## 2015-04-06 DIAGNOSIS — I313 Pericardial effusion (noninflammatory): Secondary | ICD-10-CM | POA: Insufficient documentation

## 2015-04-06 DIAGNOSIS — Z87891 Personal history of nicotine dependence: Secondary | ICD-10-CM | POA: Insufficient documentation

## 2015-04-06 DIAGNOSIS — I5022 Chronic systolic (congestive) heart failure: Secondary | ICD-10-CM | POA: Insufficient documentation

## 2015-04-06 DIAGNOSIS — I447 Left bundle-branch block, unspecified: Secondary | ICD-10-CM | POA: Diagnosis not present

## 2015-04-06 DIAGNOSIS — I319 Disease of pericardium, unspecified: Secondary | ICD-10-CM | POA: Diagnosis not present

## 2015-04-06 DIAGNOSIS — I5032 Chronic diastolic (congestive) heart failure: Secondary | ICD-10-CM | POA: Diagnosis not present

## 2015-04-06 DIAGNOSIS — M069 Rheumatoid arthritis, unspecified: Secondary | ICD-10-CM | POA: Diagnosis not present

## 2015-04-06 DIAGNOSIS — I480 Paroxysmal atrial fibrillation: Secondary | ICD-10-CM | POA: Diagnosis not present

## 2015-04-06 DIAGNOSIS — E119 Type 2 diabetes mellitus without complications: Secondary | ICD-10-CM | POA: Diagnosis not present

## 2015-04-06 DIAGNOSIS — Z79899 Other long term (current) drug therapy: Secondary | ICD-10-CM | POA: Diagnosis not present

## 2015-04-06 DIAGNOSIS — I3139 Other pericardial effusion (noninflammatory): Secondary | ICD-10-CM

## 2015-04-06 DIAGNOSIS — I4892 Unspecified atrial flutter: Secondary | ICD-10-CM | POA: Diagnosis not present

## 2015-04-06 DIAGNOSIS — I10 Essential (primary) hypertension: Secondary | ICD-10-CM | POA: Diagnosis not present

## 2015-04-06 MED ORDER — AMIODARONE HCL 400 MG PO TABS: 400.0000 mg | ORAL_TABLET | Freq: Two times a day (BID) | ORAL | Status: DC

## 2015-04-06 MED ORDER — PREDNISONE 10 MG PO TABS: 60.0000 mg | ORAL_TABLET | Freq: Every day | ORAL | Status: DC

## 2015-04-06 MED ORDER — APIXABAN 5 MG PO TABS: 5.0000 mg | ORAL_TABLET | Freq: Two times a day (BID) | ORAL | Status: DC

## 2015-04-06 NOTE — Patient Instructions (Addendum)
INCREASE Amiodarone to 400 mg tablet twice daily.  START Eliquis 5 mg tablet twice daily.  Refills sent for prednisone.  Sleep study scheduled for 04/12/15 at 8:30 pm [facility] [address] [place], [place] [address]  Follow up 2-3 weeks.

## 2015-04-06 NOTE — Progress Notes (Signed)
Patient ID: GIANN OBARA, male   DOB: 01-17-1951, 64 y.o.   MRN: [id number]  Referring Physician: Primary Care: Primary HF Cardiologist: Bensihmon  HPI: ANDERSSON LARRABEE is a 64 y.o. with hx of recently diagnosed systolic HF (EF 50%), RA (on chronic prednisone - followed by Dr. Ouida Sills), DM2, LBBB admitted with CP and found    Was admitted on 03/26/15 with CP and found to have moderate pericardial effusion, with thickened RV free wall and EF 30%. CT of chest negative for PE. Troponin was negative. He was taken to cath lab on July 22nd and with no evidence of tamponade. [name] catheter remained in place to guide therapy. On July 24 he developed A fib RVR and was started on amio + cardizem drip. He was cardioverted on July 24th and maintained a NSR and will continue on oral amiodarone. Due to significant pericardial effusion he was not placed on anticoagulant. He was not a candidate pericardial window due to chronic steroids.   The etiology of LV dysfunction was unclear so he had LHC that showed normal coronaries. Periods of apnea noted on July 26th with oxygen saturations down to the 70s. Home oxygen was arranged for night time. As he continued to improve swan ganz catheter was removed. He was placed on carvedilol, entresto, and spiro. He was discharged to home on 7/27 in stable condition.  He returns today for post hospital follow up. Says he had been feeling ok up until yesterday, when about 10 minutes after his morning coffee he felt like his heart was beating irregularly and had increased SOB for about 30 minutes. He felt better after taking all of his medications.  The same thing occurred this morning and EKG showed him to be back in [place]. He has had no other problems with SOB at home, but has had a decreased energy level.  He says he has felt about 70% of his normal since leaving the hospital.  He reports taking all of his medications as directed.  Denies orthopnea, PND, CP. Some  lightheadedness and facial pallor during the episodes.  No Cough, N/V, or sick contacts.  Weight stable. Energy level has been fine up until these two episodes.   RHC 03/25/2015 RA 9  RV 33/6 (21)  PCWP 9  Thermo 5.8/2.5  Fick 4.2/1.8  LHC 03/28/2015--> normal cors   DC-CV 03/27/2015   03/29/2015 cMRI EF 36% mild hyperenhancement suggestive of myocarditis. Moderate pericardial effusion    Review of Systems: [y] = yes, [ ]  = no   General: Weight gain [ ] ; Weight loss [ ] ; Anorexia [ ] ; Fatigue [y]; Fever [ ] ; Chills [ ] ; Weakness [ ]   Cardiac: Chest pain/pressure [ ] ; Resting SOB [ ] ; Exertional SOB [ ] ; Orthopnea [ ] ; Pedal Edema [ ] ; Palpitations [ ] ; Syncope [ ] ; Presyncope [ ] ; Paroxysmal nocturnal dyspnea[ ]   Pulmonary: Cough [ ] ; Wheezing[ ] ; Hemoptysis[ ] ; Sputum [ ] ; Snoring [ ]   GI: Vomiting[ ] ; Dysphagia[ ] ; Melena[ ] ; Hematochezia [ ] ; Heartburn[ ] ; Abdominal pain [ ] ; Constipation [ ] ; Diarrhea [ ] ; BRBPR [ ]   GU: Hematuria[ ] ; Dysuria [ ] ; Nocturia[ ]   Vascular: Pain in legs with walking [ ] ; Pain in feet with lying flat [ ] ; Non-healing sores [ ] ; Stroke [ ] ; TIA [ ] ; Slurred speech [ ] ;  Neuro: Headaches[ ] ; Vertigo[ ] ; Seizures[ ] ; Paresthesias[ ] ;Blurred vision [ ] ; Diplopia [ ] ; Vision changes [ ]   Ortho/Skin: Arthritis [ ] ; Joint  pain [ ] ; Muscle pain [ ] ; Joint swelling [ ] ; Back Pain [ ] ; Rash [ ]   Psych: Depression[ ] ; Anxiety[ ]   Heme: Bleeding problems [ ] ; Clotting disorders [ ] ; Anemia [ ]   Endocrine: Diabetes [ ] ; Thyroid dysfunction[ ]    Past Medical History  Diagnosis Date  . Hypertension   . Arthritis   . Ruptured lumbar disc     L5-S1   x 2 disc  . Kidney stones   . Foot drop, left   . Osteomyelitis 2010    left foot  . Osteomyelitis 2008/2010    left  foot- metatarsal- per patient was MRSA  . Diabetes mellitus     diet controlled    Current Outpatient Prescriptions  Medication Sig Dispense Refill  . amiodarone (PACERONE) 200 MG tablet  Take 1 tablet (200 mg total) by mouth 2 (two) times daily. 60 tablet 6  . carvedilol (COREG) 3.125 MG tablet Take 1 tablet (3.125 mg total) by mouth 2 (two) times daily with a meal. 60 tablet 6  . folic acid (FOLVITE) 1 MG tablet Take 1 mg by mouth daily.    . Methotrexate Sodium (METHOTREXATE, PF,) 50 MG/2ML injection Inject 0.8 mLs as directed once a week. Inject 0.8 mLs as directed once a week.    . predniSONE (DELTASONE) 10 MG tablet Take 10 mg by mouth daily. Take 1 10mg  tablet daily    . predniSONE (DELTASONE) 10 MG tablet Take 6 tablets (60 mg total) by mouth daily with breakfast. 42 tablet 1  . sacubitril-valsartan (ENTRESTO) 24-26 MG Take 1 tablet by mouth 2 (two) times daily. 60 tablet 6  . traMADol (ULTRAM) 50 MG tablet Take 50 mg by mouth daily as needed. Take 1 50mg  tablet daily as needed    . Vitamin D, Ergocalciferol, (DRISDOL) 50000 UNITS CAPS capsule Take 1,000 Units by mouth daily.    Marland Kitchen oxyCODONE (OXY IR/ROXICODONE) 5 MG immediate release tablet Take 1-2 tablets (5-10 mg total) by mouth every 3 (three) hours as needed. (Patient not taking: Reported on 04/06/2015) 80 tablet 0   No current facility-administered medications for this encounter.    Allergies  Allergen Reactions  . Sulfa Drugs Cross Reactors Other (See Comments)    Flu like symptoms   . Tape     Tears his skin      History   Social History  . Marital Status: Married    Spouse Name: N/A  . Number of Children: N/A  . Years of Education: N/A   Occupational History  . Not on file.   Social History Main Topics  . Smoking status: Former Smoker -- 1.00 packs/day for 25 years    Quit date: 06/05/2006  . Smokeless tobacco: Never Used  . Alcohol Use: Yes     Comment: socially  . Drug Use: No  . Sexual Activity: Not on file   Other Topics Concern  . Not on file   Social History Narrative     No family history on file.  Filed Vitals:   04/06/15 1019  BP: 84/64  Pulse: 64  Resp: 20  Weight: 205  lb (92.987 kg)  SpO2: 96%    PHYSICAL EXAM: General:  Well appearing. No respiratory difficulty HEENT: normal Neck: supple. JVD 6-7. Carotids 2+ bilat; no bruits. No lymphadenopathy or thryomegaly appreciated. Cor: PMI nondisplaced. Irregularly irregular rate & rhythm. No rubs, gallops or murmurs. Lungs: CTA Abdomen: soft, nontender, nondistended. No hepatosplenomegaly. No bruits or masses. Good bowel sounds.  Extremities: no cyanosis, clubbing, rash, edema. + diffuse RA changes Neuro: alert & oriented x 3, cranial nerves grossly intact. moves all 4 extremities w/o difficulty. Affect pleasant.  ECG: A flutter with RVR  ASSESSMENT & PLAN: 1. Chronic systolic HF  -EF ~56-38% 7/56  - 03/28/15 LHC normal cors.  - cMRI 7/16 EF 36% 2. Pericardial effusion, moderate  --no tamponade by [place] 7/22  3. RA  4. DM2 with h/o osteomyelitis LLE  6. Chronic LBBB  7. Atrial fibrillation/flutter with RVR s/p DC-CV 7/24.  CHA2DS2VASC is 3. 9. Hypoxia - ? Sleep Apnea . Home oxygen and outpatient sleep study arranged.   EKG shows a flutter with RVR today.  Corrected spontaneously during the exam.  Will increase Amio to 400 mg BID. Start on Eliquis 5 mg BID.  Portable echo performed at bedside showed some improvement in EF and decrease in effusion.  Legrand Como [address] [place], PA-C 04/06/2015 10:59 AM   Patient seen and examined with Oda Kilts, PA-C. We discussed all aspects of the encounter. I agree with the assessment and plan as stated above.   Overall much improved but having paroxysms of AFL. I did bedside echo in clinic and pericardial effusion essentially resolved with higher dose steroids. Will continue Has f/u with Rheum next week. EF now 40-45% and HF symptoms improved as well. Will increase amio to 400 bid and start Eliquis 5 bid. Need to arrange sleep study ASAP. Also decrease caffeine. Continue current HF regimen. Check labs.   Bensimhon, Daniel,MD 11:56 AM

## 2015-04-06 NOTE — Progress Notes (Signed)
Patient scheduled for STAT sleep study with [facility] on 04/12/15 @ 8:30 pm. All medical records, order, and patient info faxed to [facility] as requested to [phone]. Patient and wife made aware of appointment.  Renee Pain

## 2015-04-07 ENCOUNTER — Telehealth (HOSPITAL_COMMUNITY): Payer: Self-pay

## 2015-04-07 NOTE — Telephone Encounter (Signed)
Patient's wife called to report patient had a few "spells" of heart rhythm irregularity this morning.  States it was not as severe as yesterday when patient was seen in our CHF clinic, came and went a few times, and patient was not symptomatic this time.  Advised that increase in Amiodarone that patient started last night may take a few more doses to get the full effect.  Instructed to call us if irregular heart beat happens more often, and/or if patient more symptomatic with these events (if we are not available advised to go to ED). Also questioned about an old IV site from recent hospitalization, stating that after a hot shower and taking new Rx Eliquis a knot formed at this site and was slightly bruised.  Patient elevated arm and put ice to site and it has since resolved.  Instructed to avoid hot showers and ice PRN.  If site becomes inflammed and does not resolve with elevation and ice, and if bruising spreads dependently with hand tingling/numbness to go to ED.  Aware and agreeable to plan.  Renee Pain

## 2015-04-27 ENCOUNTER — Ambulatory Visit (HOSPITAL_COMMUNITY)
Admission: RE | Admit: 2015-04-27 | Discharge: 2015-04-27 | Disposition: A | Discharge status: Home / Self Care | Payer: BLUE CROSS/BLUE SHIELD | Source: Ambulatory Visit | Attending: Cardiology | Admitting: Cardiology

## 2015-04-27 ENCOUNTER — Telehealth (HOSPITAL_COMMUNITY): Payer: Self-pay | Admitting: *Deleted

## 2015-04-27 ENCOUNTER — Encounter (HOSPITAL_COMMUNITY): Payer: Self-pay

## 2015-04-27 VITALS — BP 108/64 | HR 70 | Wt 205.0 lb

## 2015-04-27 DIAGNOSIS — M069 Rheumatoid arthritis, unspecified: Secondary | ICD-10-CM | POA: Diagnosis not present

## 2015-04-27 DIAGNOSIS — I5022 Chronic systolic (congestive) heart failure: Secondary | ICD-10-CM | POA: Diagnosis not present

## 2015-04-27 DIAGNOSIS — E119 Type 2 diabetes mellitus without complications: Secondary | ICD-10-CM | POA: Insufficient documentation

## 2015-04-27 DIAGNOSIS — I447 Left bundle-branch block, unspecified: Secondary | ICD-10-CM | POA: Insufficient documentation

## 2015-04-27 DIAGNOSIS — G4733 Obstructive sleep apnea (adult) (pediatric): Secondary | ICD-10-CM | POA: Diagnosis not present

## 2015-04-27 DIAGNOSIS — Z87891 Personal history of nicotine dependence: Secondary | ICD-10-CM | POA: Diagnosis not present

## 2015-04-27 DIAGNOSIS — I4891 Unspecified atrial fibrillation: Secondary | ICD-10-CM | POA: Insufficient documentation

## 2015-04-27 DIAGNOSIS — I480 Paroxysmal atrial fibrillation: Secondary | ICD-10-CM | POA: Diagnosis not present

## 2015-04-27 DIAGNOSIS — I313 Pericardial effusion (noninflammatory): Secondary | ICD-10-CM | POA: Diagnosis not present

## 2015-04-27 LAB — COMPREHENSIVE METABOLIC PANEL
ALK PHOS: 59 U/L (ref 38–126)
ALT: 24 U/L (ref 17–63)
AST: 24 U/L (ref 15–41)
Albumin: 2.8 g/dL — ABNORMAL LOW (ref 3.5–5.0)
Anion gap: 13 (ref 5–15)
BILIRUBIN TOTAL: 0.5 mg/dL (ref 0.3–1.2)
BUN: 29 mg/dL — AB (ref 6–20)
CO2: 23 mmol/L (ref 22–32)
Calcium: 8.7 mg/dL — ABNORMAL LOW (ref 8.9–10.3)
Chloride: 100 mmol/L — ABNORMAL LOW (ref 101–111)
Creatinine, Ser: 1.39 mg/dL — ABNORMAL HIGH (ref 0.61–1.24)
GFR calc Af Amer: >60 mL/min (ref >60)
GFR calc non Af Amer: 53 mL/min — ABNORMAL LOW (ref >60)
GLUCOSE: 215 mg/dL — AB (ref 65–99)
POTASSIUM: 4 mmol/L (ref 3.5–5.1)
Sodium: 136 mmol/L (ref 135–145)
Total Protein: 5.9 g/dL — ABNORMAL LOW (ref 6.5–8.1)

## 2015-04-27 LAB — BRAIN NATRIURETIC PEPTIDE: B Natriuretic Peptide: 128.6 pg/mL — ABNORMAL HIGH (ref 0.0–100.0)

## 2015-04-27 MED ORDER — AMIODARONE HCL 400 MG PO TABS: 200.0000 mg | ORAL_TABLET | Freq: Two times a day (BID) | ORAL | Status: DC

## 2015-04-27 NOTE — Patient Instructions (Signed)
TAKE Amiodarone 400mg  twice a day for 2 WEEKS... Then Take Amiodarone 200mg  twice day.  Routine lab work today. (cmet bnp) Will notify you of abnormal results  FOLLOW UP in 6 weeks.

## 2015-04-27 NOTE — Progress Notes (Signed)
Patient ID: Ethan Alexander, male   DOB: 02/10/1951, 64 y.o.   MRN: [id number]   Referring Physician: Primary Care: Primary HF Cardiologist: [place]  HPI: Ethan Alexander is a 64 y.o. with hx of recently diagnosed systolic HF (EF 53%), RA (on chronic prednisone - followed by Dr. Ouida Sills), DM2, LBBB admitted with CP and found    Was admitted on 03/26/15 with CP and found to have moderate pericardial effusion, with thickened RV free wall and EF 30%. CT of chest negative for PE. Troponin was negative. He was taken to cath lab on July 22nd and with no evidence of tamponade. [name] catheter remained in place to guide therapy. On July 24 he developed A fib RVR and was started on amio + cardizem drip. He was cardioverted on July 24th and maintained a NSR and will continue on oral amiodarone. Due to significant pericardial effusion he was not placed on anticoagulant. He was not a candidate pericardial window due to chronic steroids.   The etiology of LV dysfunction was unclear so he had LHC that showed normal coronaries. Periods of apnea noted on July 26th with oxygen saturations down to the 70s. Home oxygen was arranged for night time. As he continued to improve swan ganz catheter was removed. He was placed on carvedilol, entresto, and spiro. He was discharged to home on 7/27 in stable condition.  He returns today for HF follow up. At last visit he present with AFL but spontaneously converted. Amio increased to 400 bid. Bedside echo 40-45%. Pericardial effusion essentially resolved. Now seeing Dr. Amil Amen in Rheumatology about to start biologics. Prednisone being weaned. Feels much better. Had brief palpitations for about 5 days in a row last week in the mornings. On Saturday had several hour spell but none since. Failed sleep study with AHI 76. Pending CPAP titration study. Denies dyspnea, orthopnea, PND or edema.    RHC 03/25/2015 RA 9  RV 33/6 (21)  PCWP 9  Thermo 5.8/2.5  Fick  4.2/1.8  LHC 03/28/2015--> normal cors   DC-CV 03/27/2015   03/29/2015 cMRI EF 36% mild hyperenhancement suggestive of myocarditis. Moderate pericardial effusion  Bedside echo 8/16: 40-45% pericardial effusion resolved   Review of Systems: [y] = yes, [ ]  = no   General: Weight gain [ ] ; Weight loss [ ] ; Anorexia [ ] ; Fatigue [y]; Fever [ ] ; Chills [ ] ; Weakness [ ]   Cardiac: Chest pain/pressure [ ] ; Resting SOB [ ] ; Exertional SOB [ ] ; Orthopnea [ ] ; Pedal Edema [ ] ; Palpitations [ ] ; Syncope [ ] ; Presyncope [ ] ; Paroxysmal nocturnal dyspnea[ ]   Pulmonary: Cough [ ] ; Wheezing[ ] ; Hemoptysis[ ] ; Sputum [ ] ; Snoring [ ]   GI: Vomiting[ ] ; Dysphagia[ ] ; Melena[ ] ; Hematochezia [ ] ; Heartburn[ ] ; Abdominal pain [ ] ; Constipation [ ] ; Diarrhea [ ] ; BRBPR [ ]   GU: Hematuria[ ] ; Dysuria [ ] ; Nocturia[ ]   Vascular: Pain in legs with walking [ ] ; Pain in feet with lying flat [ ] ; Non-healing sores [ ] ; Stroke [ ] ; TIA [ ] ; Slurred speech [ ] ;  Neuro: Headaches[ ] ; Vertigo[ ] ; Seizures[ ] ; Paresthesias[ ] ;Blurred vision [ ] ; Diplopia [ ] ; Vision changes [ ]   Ortho/Skin: Arthritis [ ] ; Joint pain [ ] ; Muscle pain [ ] ; Joint swelling [ ] ; Back Pain [ ] ; Rash [ ]   Psych: Depression[ ] ; Anxiety[ ]   Heme: Bleeding problems [ ] ; Clotting disorders [ ] ; Anemia [ ]   Endocrine: Diabetes [ ] ; Thyroid dysfunction[ ]   Past Medical History  Diagnosis Date  . Hypertension   . Arthritis   . Ruptured lumbar disc     L5-S1   x 2 disc  . Kidney stones   . Foot drop, left   . Osteomyelitis 2010    left foot  . Osteomyelitis 2008/2010    left  foot- metatarsal- per patient was MRSA  . Diabetes mellitus     diet controlled    Current Outpatient Prescriptions  Medication Sig Dispense Refill  . amiodarone (PACERONE) 400 MG tablet Take 0.5 tablets (200 mg total) by mouth 2 (two) times daily. 60 tablet 6  . apixaban (ELIQUIS) 5 MG TABS tablet Take 1 tablet (5 mg total) by mouth 2 (two) times daily. 60 tablet 6   . carvedilol (COREG) 3.125 MG tablet Take 1 tablet (3.125 mg total) by mouth 2 (two) times daily with a meal. 60 tablet 6  . folic acid (FOLVITE) 1 MG tablet Take 1 mg by mouth daily.    . Methotrexate Sodium (METHOTREXATE, PF,) 50 MG/2ML injection Inject 0.8 mLs as directed once a week. Inject 0.8 mLs as directed once a week.    Marland Kitchen oxyCODONE (OXY IR/ROXICODONE) 5 MG immediate release tablet Take 1-2 tablets (5-10 mg total) by mouth every 3 (three) hours as needed. 80 tablet 0  . sacubitril-valsartan (ENTRESTO) 24-26 MG Take 1 tablet by mouth 2 (two) times daily. 60 tablet 6  . traMADol (ULTRAM) 50 MG tablet Take 50 mg by mouth daily as needed. Take 1 50mg  tablet daily as needed    . Vitamin D, Ergocalciferol, (DRISDOL) 50000 UNITS CAPS capsule Take 1,000 Units by mouth daily.    . predniSONE (DELTASONE) 10 MG tablet Take 6 tablets (60 mg total) by mouth daily with breakfast. 180 tablet 2   No current facility-administered medications for this encounter.    Allergies  Allergen Reactions  . Sulfa Drugs Cross Reactors Other (See Comments)    Flu like symptoms   . Tape     Tears his skin      Social History   Social History  . Marital Status: Married    Spouse Name: N/A  . Number of Children: N/A  . Years of Education: N/A   Occupational History  . Not on file.   Social History Main Topics  . Smoking status: Former Smoker -- 1.00 packs/day for 25 years    Quit date: 06/05/2006  . Smokeless tobacco: Never Used  . Alcohol Use: Yes     Comment: socially  . Drug Use: No  . Sexual Activity: Not on file   Other Topics Concern  . Not on file   Social History Narrative     No family history on file.  Filed Vitals:   04/27/15 1524  BP: 108/64  Pulse: 70  Weight: 205 lb (92.987 kg)  SpO2: 97%    PHYSICAL EXAM: General:  Well appearing. No respiratory difficulty HEENT: normal Neck: supple. JVD 6-7. Carotids 2+ bilat; no bruits. No lymphadenopathy or thryomegaly  appreciated. Cor: PMI nondisplaced. Irregularly irregular rate & rhythm. No rubs, gallops or murmurs. Lungs: CTA Abdomen: soft, nontender, nondistended. No hepatosplenomegaly. No bruits or masses. Good bowel sounds. Extremities: no cyanosis, clubbing, rash, edema. + diffuse RA changes Neuro: alert & oriented x 3, cranial nerves grossly intact. moves all 4 extremities w/o difficulty. Affect pleasant.   ASSESSMENT & PLAN: 1. Chronic systolic HF  -EF ~27-74% 1/28  - 03/28/15 LHC normal cors.  - cMRI 7/16 EF  36% 2. Pericardial effusion, moderate  --no tamponade by [place] 7/22  --resolved by echo 8/16 3. RA  4. DM2 with h/o osteomyelitis LLE  6. Chronic LBBB  7. Atrial fibrillation/flutter with RVR s/p DC-CV 7/24.  CHA2DS2VASC is 3. 9. OSA - severe with AHI 76. Pending CPAP titration study  Doing well. NYHA I-II. Having recurrent runs of probable AFL. Will continue amio at 400 bid for 2 more weeks then go to 200 bid. If AFL persists after treatment of OSA will need event monitor and consideration of ablation. Continue Eliquis. HF much improved. Will continue current regimen for now. Consider increase of carvedilol at next visit.   Quantasia Stegner,MD 9:47 PM

## 2015-05-02 ENCOUNTER — Telehealth (HOSPITAL_COMMUNITY): Payer: Self-pay | Admitting: *Deleted

## 2015-05-02 NOTE — Telephone Encounter (Signed)
Completed PA for pt's Ethan Alexander, med approved 04/27/15-09/02/38, ref # [id number], info faxed to [facility]

## 2015-05-16 ENCOUNTER — Telehealth (HOSPITAL_COMMUNITY): Payer: Self-pay | Admitting: Cardiology

## 2015-05-16 NOTE — Telephone Encounter (Signed)
PATIENT LEFT MESSAGE REGARDING AFIB/PALAPATIONS AT TIMES WAS TOLD HE MAY NEED MONITOR IN THE FUTURE PATIENT STATES HE HAS AN APPT ON 9/14, WILL DISCUSS WITH PROVIDER  APPT 9/14 FOR LABS ONLY WILL NOT SEE PROVIDER LEFT MESSAGE TO GET MORE INFORMATION

## 2015-05-18 ENCOUNTER — Ambulatory Visit (HOSPITAL_COMMUNITY)
Admission: RE | Admit: 2015-05-18 | Discharge: 2015-05-18 | Disposition: A | Discharge status: Home / Self Care | Payer: BLUE CROSS/BLUE SHIELD | Source: Ambulatory Visit | Attending: Internal Medicine | Admitting: Internal Medicine

## 2015-05-18 DIAGNOSIS — I5022 Chronic systolic (congestive) heart failure: Secondary | ICD-10-CM | POA: Insufficient documentation

## 2015-05-18 LAB — BASIC METABOLIC PANEL
Anion gap: 8 (ref 5–15)
BUN: 28 mg/dL — AB (ref 6–20)
CALCIUM: 9.2 mg/dL (ref 8.9–10.3)
CO2: 25 mmol/L (ref 22–32)
Chloride: 104 mmol/L (ref 101–111)
Creatinine, Ser: 1.4 mg/dL — ABNORMAL HIGH (ref 0.61–1.24)
GFR calc Af Amer: >60 mL/min (ref >60)
GFR, EST NON AFRICAN AMERICAN: 52 mL/min — AB (ref >60)
Glucose, Bld: 223 mg/dL — ABNORMAL HIGH (ref 65–99)
Potassium: 4.2 mmol/L (ref 3.5–5.1)
Sodium: 137 mmol/L (ref 135–145)

## 2015-05-19 ENCOUNTER — Telehealth (HOSPITAL_COMMUNITY): Payer: Self-pay | Admitting: *Deleted

## 2015-05-19 DIAGNOSIS — I5041 Acute combined systolic (congestive) and diastolic (congestive) heart failure: Secondary | ICD-10-CM

## 2015-05-19 NOTE — Telephone Encounter (Signed)
Called pt to inform him that [facility] would like him to have a 30day even monitor since his heartbeat isnt irregular often (pt stated maaaybe once or twice a week not daily) place order and set up appt with sharon ([address] office)

## 2015-05-23 ENCOUNTER — Ambulatory Visit (INDEPENDENT_AMBULATORY_CARE_PROVIDER_SITE_OTHER): Payer: BLUE CROSS/BLUE SHIELD

## 2015-05-23 ENCOUNTER — Other Ambulatory Visit (HOSPITAL_COMMUNITY): Payer: Self-pay | Admitting: Internal Medicine

## 2015-05-23 DIAGNOSIS — I5041 Acute combined systolic (congestive) and diastolic (congestive) heart failure: Secondary | ICD-10-CM

## 2015-05-23 DIAGNOSIS — I480 Paroxysmal atrial fibrillation: Secondary | ICD-10-CM

## 2015-05-24 ENCOUNTER — Institutional Professional Consult (permissible substitution): Payer: BLUE CROSS/BLUE SHIELD | Admitting: Pulmonary Disease

## 2015-06-02 ENCOUNTER — Encounter (HOSPITAL_COMMUNITY): Payer: Self-pay | Admitting: Emergency Medicine

## 2015-06-02 ENCOUNTER — Emergency Department (HOSPITAL_COMMUNITY)
Admission: EM | Admit: 2015-06-02 | Discharge: 2015-06-03 | Disposition: A | Discharge status: Home / Self Care | Payer: BLUE CROSS/BLUE SHIELD | Attending: Physician Assistant | Admitting: Physician Assistant

## 2015-06-02 ENCOUNTER — Emergency Department (HOSPITAL_COMMUNITY): Payer: BLUE CROSS/BLUE SHIELD

## 2015-06-02 DIAGNOSIS — Z79899 Other long term (current) drug therapy: Secondary | ICD-10-CM | POA: Insufficient documentation

## 2015-06-02 DIAGNOSIS — Z87891 Personal history of nicotine dependence: Secondary | ICD-10-CM | POA: Insufficient documentation

## 2015-06-02 DIAGNOSIS — H5712 Ocular pain, left eye: Secondary | ICD-10-CM

## 2015-06-02 DIAGNOSIS — E1165 Type 2 diabetes mellitus with hyperglycemia: Secondary | ICD-10-CM | POA: Insufficient documentation

## 2015-06-02 DIAGNOSIS — R7989 Other specified abnormal findings of blood chemistry: Secondary | ICD-10-CM | POA: Diagnosis not present

## 2015-06-02 DIAGNOSIS — M1990 Unspecified osteoarthritis, unspecified site: Secondary | ICD-10-CM | POA: Insufficient documentation

## 2015-06-02 DIAGNOSIS — R61 Generalized hyperhidrosis: Secondary | ICD-10-CM | POA: Diagnosis not present

## 2015-06-02 DIAGNOSIS — R0789 Other chest pain: Secondary | ICD-10-CM | POA: Diagnosis not present

## 2015-06-02 DIAGNOSIS — R42 Dizziness and giddiness: Secondary | ICD-10-CM | POA: Diagnosis not present

## 2015-06-02 DIAGNOSIS — R079 Chest pain, unspecified: Secondary | ICD-10-CM | POA: Diagnosis present

## 2015-06-02 DIAGNOSIS — I10 Essential (primary) hypertension: Secondary | ICD-10-CM | POA: Insufficient documentation

## 2015-06-02 DIAGNOSIS — H1132 Conjunctival hemorrhage, left eye: Secondary | ICD-10-CM | POA: Diagnosis not present

## 2015-06-02 DIAGNOSIS — R739 Hyperglycemia, unspecified: Secondary | ICD-10-CM

## 2015-06-02 HISTORY — DX: Disorder of kidney and ureter, unspecified: N28.9

## 2015-06-02 LAB — CBC WITH DIFFERENTIAL/PLATELET
BASOS ABS: 0 10*3/uL (ref 0.0–0.1)
BASOS PCT: 0 %
EOS ABS: 0.1 10*3/uL (ref 0.0–0.7)
EOS PCT: 2 %
HCT: 36.7 % — ABNORMAL LOW (ref 39.0–52.0)
HEMOGLOBIN: 12 g/dL — AB (ref 13.0–17.0)
Lymphocytes Relative: 7 %
Lymphs Abs: 0.5 10*3/uL — ABNORMAL LOW (ref 0.7–4.0)
MCH: 32.3 pg (ref 26.0–34.0)
MCHC: 32.7 g/dL (ref 30.0–36.0)
MCV: 98.9 fL (ref 78.0–100.0)
Monocytes Absolute: 1 10*3/uL (ref 0.1–1.0)
Monocytes Relative: 13 %
NEUTROS PCT: 78 %
Neutro Abs: 5.7 10*3/uL (ref 1.7–7.7)
PLATELETS: 263 10*3/uL (ref 150–400)
RBC: 3.71 MIL/uL — AB (ref 4.22–5.81)
RDW: 17 % — ABNORMAL HIGH (ref 11.5–15.5)
WBC: 7.4 10*3/uL (ref 4.0–10.5)

## 2015-06-02 MED ORDER — GI COCKTAIL ~~LOC~~
30.0000 mL | Freq: Once | ORAL | Status: DC
  Filled 2015-06-02: qty 30

## 2015-06-02 NOTE — ED Notes (Signed)
Pt states he had an acute onset of chest tightness while eating dinner this evening (2000). Pt denied pain radiated anywhere, SOB, or N/V. Pt did have a blood vessel in left eye burst when the chest tightness came on. Pt presents to the ER with minimal pain at this time.

## 2015-06-02 NOTE — ED Provider Notes (Signed)
CSN: [id number]     Arrival date & time 06/02/15  2122 History   First MD Initiated Contact with Patient 06/02/15 2156     Chief Complaint  Patient presents with  . Chest Pain     (Consider location/radiation/quality/duration/timing/severity/associated sxs/prior Treatment) HPI Comments: Ethan Alexander is a 64 y.o. male with a PMHx of HTN, chronic low back pain, prior osteomyelitis of L foot s/p surgery with residual foot drop, DM2, and RA on methotrexate, who presents to the ED with complaints of sudden onset left eye pain and chest pain that began around 8:15 PM while he was eating dinner. He reports that he felt a burning sensation in his left eye, looked in the mirror and noticed that he had a burst blood vessel. He reports that this is happened spontaneously in the past, but was not this severe, but he is on eliquis and thinks this may play a part. At the time that he experienced the eye pain he also experienced 2/10 central chest pain which was initially constant but resolved spontaneously, was burning in nature, with no known aggravating factors, and took aspirin but it has resolved on its own already. He also reports that initially he had a "brief second" of diplopia resolved entirely he has no residual visual changes. Additionally reports diaphoresis and lightheadedness at the time of onset of his symptoms.  He denies any recent fevers, chills, cough, shortness of breath, leg swelling, recent travel/surgery/immobilization, history of DVT/PE, abdominal pain, nausea, vomiting, diarrhea, constipation, dysuria, hematuria, numbness, tingling, weakness, claudication, orthopnea, eye drainage, vision loss, floaters or spots in his vision, headache, or syncope. He is a nonsmoker. Family history of MI in his father. He is currently on Eliquis and takes it as directed. He was recently in the hospital in July 2016 for pericardial effusion which they felt was from his active RA. He had a heart cath, cardiac  MRI, and echo which revealed normal coronaries and EF 35%.   Patient is a 64 y.o. male presenting with chest pain. The history is provided by the patient and medical records. No language interpreter was used.  Chest Pain Pain location:  Substernal area Pain quality: burning   Pain radiates to:  Does not radiate Pain radiates to the back: no   Pain severity:  Mild Onset quality:  Sudden Duration:  2 hours Timing:  Constant Progression:  Resolved Chronicity:  New Context: at rest   Relieved by:  Aspirin (spontaneously, but took ASA) Worsened by:  Nothing tried Ineffective treatments:  None tried Associated symptoms: diaphoresis   Associated symptoms: no abdominal pain, no claudication, no cough, no fever, no headache, no lower extremity edema, no nausea, no numbness, no orthopnea, no shortness of breath, no syncope, not vomiting and no weakness   Risk factors: diabetes mellitus, hypertension and male sex   Risk factors: no coronary artery disease, no high cholesterol, no immobilization, no prior DVT/PE, no smoking and no surgery     Past Medical History  Diagnosis Date  . Hypertension   . Arthritis   . Ruptured lumbar disc     L5-S1   x 2 disc  . Foot drop, left   . Osteomyelitis 2010    left foot  . Osteomyelitis 2008/2010    left  foot- metatarsal- per patient was MRSA  . Diabetes mellitus     diet controlled  . Renal disorder    Past Surgical History  Procedure Laterality Date  . Foot surgery  2008/2010  x 2  left foot/ osteomyelitis  . Achilles tendon surgery      left  . Eye surgery      cataract ectraction with IOL-  bilateral  . Total hip arthroplasty  06/11/2012    Procedure: TOTAL HIP ARTHROPLASTY;  Surgeon: Gearlean Alf, MD;  Location: [facility];  Service: Orthopedics;  Laterality: Left;  . Cardiac catheterization N/A 03/25/2015    Procedure: Right Heart Cath;  Surgeon: Leonie Man, MD;  Location: [place] [facility];  Service: Cardiovascular;  Laterality:  N/A;  . Cardioversion N/A 03/27/2015    Procedure: CARDIOVERSION;  Surgeon: Jolaine Artist, MD;  Location: [place];  Service: Cardiovascular;  Laterality: N/A;  . Cardiac catheterization N/A 03/28/2015    Procedure: Left Heart Cath;  Surgeon: Jolaine Artist, MD;  Location: [place] [facility];  Service: Cardiovascular;  Laterality: N/A;   History reviewed. No pertinent family history. Social History  Substance Use Topics  . Smoking status: Former Smoker -- 1.00 packs/day for 25 years    Quit date: 06/05/2006  . Smokeless tobacco: Never Used  . Alcohol Use: Yes     Comment: socially    Review of Systems  Constitutional: Positive for diaphoresis. Negative for fever and chills.  Eyes: Positive for pain (now resolved), redness and visual disturbance (diplopia for a "brief second" which resolved entirely). Negative for photophobia and discharge.  Respiratory: Negative for cough and shortness of breath.   Cardiovascular: Positive for chest pain. Negative for orthopnea, claudication, leg swelling and syncope.  Gastrointestinal: Negative for nausea, vomiting, abdominal pain, diarrhea and constipation.  Genitourinary: Negative for dysuria and hematuria.  Musculoskeletal: Negative for myalgias and arthralgias.  Skin: Negative for color change.  Allergic/Immunologic: Positive for immunocompromised state (diabetic diet controlled).  Neurological: Positive for light-headedness. Negative for weakness, numbness and headaches.  Hematological: Bruises/bleeds easily (on eliquis).  Psychiatric/Behavioral: Negative for confusion.   10 Systems reviewed and are negative for acute change except as noted in the HPI.    Allergies  Sulfa drugs cross reactors and Tape  Home Medications   Prior to Admission medications   Medication Sig Start Date End Date Taking? Authorizing Zunairah Devers  amiodarone (PACERONE) 400 MG tablet Take 0.5 tablets (200 mg total) by mouth 2 (two) times daily. 04/27/15   Jolaine Artist, MD  apixaban (ELIQUIS) 5 MG TABS tablet Take 1 tablet (5 mg total) by mouth 2 (two) times daily. 04/06/15   Jolaine Artist, MD  carvedilol (COREG) 3.125 MG tablet Take 1 tablet (3.125 mg total) by mouth 2 (two) times daily with a meal. 03/30/15   Amy D Clegg, NP  folic acid (FOLVITE) 1 MG tablet Take 1 mg by mouth daily.    Historical Corliss Coggeshall, MD  Methotrexate Sodium (METHOTREXATE, PF,) 50 MG/2ML injection Inject 0.8 mLs as directed once a week. Inject 0.8 mLs as directed once a week.    Historical Aryaan Persichetti, MD  oxyCODONE (OXY IR/ROXICODONE) 5 MG immediate release tablet Take 1-2 tablets (5-10 mg total) by mouth every 3 (three) hours as needed. 06/13/12   Arlee Muslim, PA-C  predniSONE (DELTASONE) 10 MG tablet Take 6 tablets (60 mg total) by mouth daily with breakfast. 04/06/15   Jolaine Artist, MD  sacubitril-valsartan (ENTRESTO) 24-26 MG Take 1 tablet by mouth 2 (two) times daily. 03/30/15   Amy D Clegg, NP  traMADol (ULTRAM) 50 MG tablet Take 50 mg by mouth daily as needed. Take 1 50mg  tablet daily as needed 10/25/14  Historical Aala Ransom, MD  Vitamin D, Ergocalciferol, (DRISDOL) 50000 UNITS CAPS capsule Take 1,000 Units by mouth daily.    Historical Chelli Yerkes, MD   BP 111/70 mmHg  Pulse 66  Temp(Src) 98.3 F (36.8 C) (Oral)  Resp 19  Ht 6\' 6"  (1.981 m)  Wt 205 lb (92.987 kg)  BMI 23.69 kg/m2  SpO2 97% Physical Exam  Constitutional: He is oriented to person, place, and time. Vital signs are normal. He appears well-developed and well-nourished.  Non-toxic appearance. No distress.  Afebrile, nontoxic, NAD  HENT:  Head: Normocephalic and atraumatic.  Mouth/Throat: Oropharynx is clear and moist and mucous membranes are normal.  Eyes: EOM are normal. Pupils are equal, round, and reactive to light. Right eye exhibits no discharge. Left eye exhibits no discharge. Left conjunctiva has a hemorrhage.  PERRL, EOMI, no nystagmus, no visual field deficits  Visual Acuity - Bilateral  Near: 20/70 (Pt states he normally wears glasses, doesn't have them with him.) ; R Near: 20/100 ; L Near: 20/70  L eye with subconjunctival hemorrhage. No tenseness to eye with palpation.   Neck: Normal range of motion. Neck supple.  Cardiovascular: Normal rate, regular rhythm, normal heart sounds and intact distal pulses.  Exam reveals no gallop and no friction rub.   No murmur heard. RRR, nl s1/s2, no m/r/g, distal pulses intact, RLE edema which is chronic and unchanged per pt, no other pedal edema  Pulmonary/Chest: Effort normal and breath sounds normal. No respiratory distress. He has no decreased breath sounds. He has no wheezes. He has no rhonchi. He has no rales. He exhibits no tenderness, no crepitus, no deformity and no retraction.  CTAB in all lung fields, no w/r/r, no hypoxia or increased WOB, speaking in full sentences, SpO2 96-99% on RA No chest wall TTP, no crepitus or deformity, no retractions  Abdominal: Soft. Normal appearance and bowel sounds are normal. He exhibits no distension. There is no tenderness. There is no rigidity, no rebound, no guarding, no CVA tenderness, no tenderness at McBurney's point and negative Murphy's sign.  Musculoskeletal: Normal range of motion.  MAE x4 Strength and sensation grossly intact aside from chronic unchanged L foot drop Distal pulses intact Neg homan's bilaterally  Neurological: He is alert and oriented to person, place, and time. He has normal strength. No cranial nerve deficit or sensory deficit. Coordination and gait normal. GCS eye subscore is 4. GCS verbal subscore is 5. GCS motor subscore is 6.  CN 2-12 grossly intact A&O x4 GCS 15 Sensation and strength intact as discussed above Gait nonataxic Coordination with finger-to-nose WNL Neg pronator drift   Skin: Skin is warm, dry and intact. No rash noted.  Psychiatric: He has a normal mood and affect.  Nursing note and vitals reviewed.   ED Course  Procedures (including critical  care time) Labs Review Labs Reviewed  CBC WITH DIFFERENTIAL/PLATELET - Abnormal; Notable for the following:    RBC 3.71 (*)    Hemoglobin 12.0 (*)    HCT 36.7 (*)    RDW 17.0 (*)    Lymphs Abs 0.5 (*)    All other components within normal limits  BASIC METABOLIC PANEL - Abnormal; Notable for the following:    Glucose, Bld 212 (*)    Creatinine, Ser 1.75 (*)    Calcium 8.4 (*)    GFR calc non Af Amer 40 (*)    GFR calc Af Amer 46 (*)    All other components within normal limits  Randolm Idol, ED  Imaging Review Dg Chest 2 View  06/02/2015   CLINICAL DATA:  Chest pain.  EXAM: CHEST  2 VIEW  COMPARISON:  03/25/2015  FINDINGS: Streaky lower lung opacities which could reflect atelectasis or scarring, improved since July. No pleural effusion, consolidation, pulmonary edema, or air leak. Borderline cardiopericardial enlargement, similar to decreased from prior when there was pericardial effusion. Negative aortic and hilar contours.  IMPRESSION: 1. No acute finding. 2. Mild basilar atelectasis or scarring.   Electronically Signed   By: Monte Fantasia M.D.   On: 06/02/2015 22:50    Cardiac echo 03/25/15: Study Conclusions - Left ventricle: The cavity size was normal. There was mild concentric hypertrophy. Systolic function was mildly to moderately reduced. The estimated ejection fraction was in the range of 40% to 45%. Diffuse hypokinesis. - Ventricular septum: Septal motion showed paradox. - Aortic valve: There was no regurgitation. - Mitral valve: Structurally normal valve. - Left atrium: The atrium was normal in size. - Right ventricle: Systolic function was normal. - Right atrium: The atrium was normal in size. - Tricuspid valve: There was mild regurgitation. - Pulmonic valve: There was no regurgitation.   Cardiac cath 03/28/15: Conclusion      Normal coronary arteries. LVEDP 10. NICM with EF 35% by echo. Will f/u with cMRI today.   Bensimhon, Daniel,MD 9:43 AM    Cardiac MRI 03/29/15:  Study Result     CLINICAL DATA: DCM? Pericardial Disease  EXAM: CARDIAC MRI  TECHNIQUE: The patient was scanned on a 1.5 Tesla GE magnet. A dedicated cardiac coil was used. Functional imaging was done using Fiesta sequences. 2,3, and 4 chamber views were done to assess for RWMA's. Modified Simpson's rule using a short axis stack was used to calculate an ejection fraction on a dedicated work Conservation officer, nature. The patient received 25 cc of Multihance. After 10 minutes inversion recovery sequences were used to assess for infiltration and scar tissue.  CONTRAST: 25 cc Multihance  FINDINGS: The LA/RA and RV were normal in size and function. The LV was severely dilated. There was dysynergic septal motion. The quantitative EF was 36% (EDV 242 cc ESV 154 cc SV 87 cc)  There was no signficant valve disease. There was a moderate circumferential pericardial effusion. There did not appear to be diastolic RV collpase. The RV free wall did not appear thickened  Delayed enhancement images showed mild subepicardial uptake in the inferior apex and the mid septum.  IMPRESSION: 1) Severe LVE with dysynergic septal motion EF 36%  2) Gadolinium uptake in mid septum and subepicardial inferior apex  3) Moderate circumferential pericardial effusion no evidence of tamponade  Jenkins Rouge   Electronically Signed  By: Jenkins Rouge M.D.  On: 03/29/2015 11:28    I have personally reviewed and evaluated these images and lab results as part of my medical decision-making.   EKG Interpretation   Date/Time:  Thursday June 02 2015 21:34:15 EDT Ventricular Rate:  68 PR Interval:  192 QRS Duration: 171 QT Interval:  461 QTC Calculation: 490 R Axis:   -43 Text Interpretation:  Sinus rhythm Left bundle branch block Left bundle  branch block No significant change since last tracing Confirmed by  Gerald Leitz ([id number]) on  06/02/2015 9:37:04 PM      MDM   Final diagnoses:  Atypical chest pain  Lightheaded  Diaphoresis  Eye pain, left  Subconjunctival hemorrhage of left eye  Elevated serum creatinine  Hyperglycemia    64 y.o. male here with sudden onset  eye pain and double vision for a brief second, burst blood vessel in his eye, and CP that began around 8:15pm while at rest. Pt is on eliquis. Has had spontaneous subconjunctival hemorrhage in the past, and on exam this is what it appears to be. Vision unchanged at present, no pain at this time. CP resolved on its own, took ASA PTA but states that the CP had already resolved before ASA started working. +lightheadedness and diaphoresis. Recent cardiac cath with normal coronaries, ECHO with EF 35%. PERRL, EOMI. No focal neuro deficits. Will get labs, CXR, EKG, and give GI cocktail. No hypoxia or tachycardia, doubt PE. Will get visual acuity. Doubt glaucoma, doubt acute intracranial issue, doubt CRAO/CRVO. Will reassess shortly.   11:50 PM Visual acuity in L eye better than R eye, pt typically uses glasses which would account for acuity of 20/70-L and 20/100-R. EKG with LBBB which is unchanged. CXR with mild bibasilar atelectasis. Labs not yet done, have been collected as of 11:15pm which was 3hrs post-onset. Will reassess after these return.   12:33 AM CBC w/diff unremarkable aside from baseline anemia. BMP with mildly elevated Cr at 1.75, slightly more than baseline, likely from the recent addition of several meds, discussed good hydration. Gluc 212 without anion gap. Trop not crossing over but verified with lab that this is negative (at 3hr mark). Pt without ongoing CP, and didn't take GI cocktail since CP was resolved. No recurrent symptoms. Feels well. Will have him f/up with PCP in 1wk, and call his cardiologist and ophthalmologist in the morning to schedule recheck of eye/chest pain symptoms. Discussed tylenol/tramadol use for pain. I explained the diagnosis and  have given explicit precautions to return to the ER including for any other new or worsening symptoms. The patient understands and accepts the medical plan as it's been dictated and I have answered their questions. Discharge instructions concerning home care and prescriptions have been given. The patient is STABLE and is discharged to home in good condition.  BP 118/71 mmHg  Pulse 66  Temp(Src) 98.3 F (36.8 C) (Oral)  Resp 22  Ht 6\' 6"  (1.981 m)  Wt 205 lb (92.987 kg)  BMI 23.69 kg/m2  SpO2 95%  Meds ordered this encounter  Medications  . gi cocktail (Maalox,Lidocaine,Donnatal)    SigZacarias Pontes, PA-C 06/03/15 0035  Courteney Julio Alm, MD 06/03/15 1656

## 2015-06-03 ENCOUNTER — Telehealth (HOSPITAL_COMMUNITY): Payer: Self-pay | Admitting: *Deleted

## 2015-06-03 LAB — BASIC METABOLIC PANEL
Anion gap: 6 (ref 5–15)
BUN: 17 mg/dL (ref 6–20)
CHLORIDE: 106 mmol/L (ref 101–111)
CO2: 25 mmol/L (ref 22–32)
Calcium: 8.4 mg/dL — ABNORMAL LOW (ref 8.9–10.3)
Creatinine, Ser: 1.75 mg/dL — ABNORMAL HIGH (ref 0.61–1.24)
GFR calc Af Amer: 46 mL/min — ABNORMAL LOW (ref >60)
GFR calc non Af Amer: 40 mL/min — ABNORMAL LOW (ref >60)
GLUCOSE: 212 mg/dL — AB (ref 65–99)
Potassium: 3.6 mmol/L (ref 3.5–5.1)
SODIUM: 137 mmol/L (ref 135–145)

## 2015-06-03 LAB — I-STAT TROPONIN, ED: TROPONIN I, POC: 0.01 ng/mL (ref 0.00–0.08)

## 2015-06-03 NOTE — Telephone Encounter (Signed)
Pt's wife called concerned about an episode pt had last night in which he was brought to ER.  She states pt had been doing very well since last appt, no SOB or chest pain, he has noticed his BP has been running on the higher side around 160s/90s.  She reports yesterday pt was fine all day, BP yesterday AM was 160/91 but pt felt fine.  Last night they were eating dinner about 8:15 and she states he got up and went to the bathroom and returned just a minute later stating that "they needed to call 911 something was wrong and he wasn't sure he would make it"  She states his eye was very blood shot, he felt dizzy and lightheaded, she reports she immediately checked his BP and it was 180/114 she called 911 and rechecked his BP and it was 170/104.  Pt was brought to ER, the report notes state chest pain however she states pt didn't tell her he was having chest pain and she doesn't think he did.  Pt is still currently wearing 30 day event monitor, have not received any critical results, have attempted to call monitor room at [facility] and have left them a mess.  She states his eye does look bad but he reports his vision is fine.  She states that this AM pt has been completely normal, denies SOB, CP, dizziness or lightheadedness.  Advised will get monitor results and discuss w/Dr Bensimhon on Monday and call her back as patient is fine at this point, advised may need to see opthalmology to get eye examined she is agreeable with plan

## 2015-06-03 NOTE — Discharge Instructions (Signed)
Continue your home medications. Stay well hydrated. Use tylenol or home tramadol as directed as needed for pain. Call your ophthalmologist and cardiologist in the morning to schedule follow up visits for your eye issue and your chest pain complaint today. Follow up with your regular doctor in 1 week for recheck of symptoms. Return to the ER for changes or worsening symptoms.   Chest Pain (Nonspecific) It is often hard to give a diagnosis for the cause of chest pain. There is always a chance that your pain could be related to something serious, such as a heart attack or a blood clot in the lungs. You need to follow up with your doctor. HOME CARE  If antibiotic medicine was given, take it as directed by your doctor. Finish the medicine even if you start to feel better.  For the next few days, avoid activities that bring on chest pain. Continue physical activities as told by your doctor.  Do not use any tobacco products. This includes cigarettes, chewing tobacco, and e-cigarettes.  Avoid drinking alcohol.  Only take medicine as told by your doctor.  Follow your doctor's suggestions for more testing if your chest pain does not go away.  Keep all doctor visits you made. GET HELP IF:  Your chest pain does not go away, even after treatment.  You have a rash with blisters on your chest.  You have a fever. GET HELP RIGHT AWAY IF:   You have more pain or pain that spreads to your arm, neck, jaw, back, or belly (abdomen).  You have shortness of breath.  You cough more than usual or cough up blood.  You have very bad back or belly pain.  You feel sick to your stomach (nauseous) or throw up (vomit).  You have very bad weakness.  You pass out (faint).  You have chills. This is an emergency. Do not wait to see if the problems will go away. Call your local emergency services (911 in [place] Do not drive yourself to the hospital. MAKE SURE YOU:   Understand these instructions.  Will  watch your condition.  Will get help right away if you are not doing well or get worse. Document Released: 02/06/2008 Document Revised: 08/25/2013 Document Reviewed: 02/06/2008 [facility] Patient Information 2015 [place], [place]. This information is not intended to replace advice given to you by your health care provider. Make sure you discuss any questions you have with your health care provider.  Subconjunctival Hemorrhage A subconjunctival hemorrhage is a bright red patch covering a portion of the white of the eye. The white part of the eye is called the sclera, and it is covered by a thin membrane called the conjunctiva. This membrane is clear, except for tiny blood vessels that you can see with the naked eye. When your eye is irritated or inflamed and becomes red, it is because the vessels in the conjunctiva are swollen. Sometimes, a blood vessel in the conjunctiva can break and bleed. When this occurs, the blood builds up between the conjunctiva and the sclera, and spreads out to create a red area. The red spot may be very small at first. It may then spread to cover a larger part of the surface of the eye, or even all of the visible white part of the eye. In almost all cases, the blood will go away and the eye will become white again. Before completely dissolving, however, the red area may spread. It may also become brownish-yellow in color before going away.  If a lot of blood collects under the conjunctiva, it may look like a bulge on the surface of the eye. This looks scary, but it will also eventually flatten out and go away. Subconjunctival hemorrhages do not cause pain, but if swollen, may cause a feeling of irritation. There is no effect on vision.  CAUSES   The most common cause is mild trauma (rubbing the eye, irritation).  Subconjunctival hemorrhages can happen because of coughing or straining (lifting heavy objects), vomiting, or sneezing.  In some cases, your doctor may want to check  your blood pressure. High blood pressure can also cause a subconjunctival hemorrhage.  Severe trauma or blunt injuries.  Diseases that affect blood clotting (hemophilia, leukemia).  Abnormalities of blood vessels behind the eye (carotid cavernous sinus fistula).  Tumors behind the eye.  Certain drugs (aspirin, Coumadin, heparin).  Recent eye surgery. HOME CARE INSTRUCTIONS   Do not worry about the appearance of your eye. You may continue your usual activities.  Often, follow-up is not necessary. SEEK MEDICAL CARE IF:   Your eye becomes painful.  The bleeding does not disappear within 3 weeks.  Bleeding occurs elsewhere, for example, under the skin, in the mouth, or in the other eye.  You have recurring subconjunctival hemorrhages. SEEK IMMEDIATE MEDICAL CARE IF:   Your vision changes or you have difficulty seeing.  You develop a severe headache, persistent vomiting, confusion, or abnormal drowsiness (lethargy).  Your eye seems to bulge or protrude from the eye socket.  You notice the sudden appearance of bruises or have spontaneous bleeding elsewhere on your body. Document Released: 08/20/2005 Document Revised: 01/04/2014 Document Reviewed: 07/18/2009 [facility] Patient Information 2015 [place], [place]. This information is not intended to replace advice given to you by your health care provider. Make sure you discuss any questions you have with your health care provider.

## 2015-06-06 NOTE — Telephone Encounter (Signed)
Received following mess from [facility] in [facility] Room at [facility], will discuss w/Dr Bensimhon:  No events recorded since 05/27/15 and then it was normal sinus rhythm. Had bradycardia on 05/24/15 but only down to 55 bpm. I did not fax strips.  Thanks, [employer]

## 2015-06-07 MED ORDER — SACUBITRIL-VALSARTAN 49-51 MG PO TABS: 1.0000 | ORAL_TABLET | Freq: Two times a day (BID) | ORAL | Status: DC

## 2015-06-07 NOTE — Telephone Encounter (Signed)
Discussed w/Dr Bemsimhon he would like pt to increase Entresto to 49/51 mg Twice daily, spoke w/pt's wife she states pt has been feeling ok BP has still been running elevated at 162/93, she is agreeable to increase Entresto and will continue to monitor pt

## 2015-06-09 NOTE — Telephone Encounter (Signed)
Patients wife reported that his blood pressure has been all over the place since he started on Entresto 49/51 two days ago.  Said both manually and electronically  Blood pressure has been anywhere between 106/63 to 163/101.  Asked her to come in for nurse vs for blood pressure check tomorrow at 0900 before they go out of state to [place].'  Patient and wife agree.  Made an appointment for nurse BP check for 10/07 at 0900

## 2015-06-10 ENCOUNTER — Ambulatory Visit (HOSPITAL_COMMUNITY)
Admission: RE | Admit: 2015-06-10 | Discharge: 2015-06-10 | Disposition: A | Discharge status: Home / Self Care | Payer: BLUE CROSS/BLUE SHIELD | Source: Ambulatory Visit | Attending: Internal Medicine | Admitting: Internal Medicine

## 2015-06-10 DIAGNOSIS — Z029 Encounter for administrative examinations, unspecified: Secondary | ICD-10-CM | POA: Diagnosis not present

## 2015-06-10 DIAGNOSIS — I5022 Chronic systolic (congestive) heart failure: Secondary | ICD-10-CM

## 2015-06-10 NOTE — Progress Notes (Signed)
Patient was seen today for a nurse visit/ blood pressure check.  Patients wife has been checking his blood pressure 5 times a day and states that it fluctuates.  Patient had an increase in Entresto at his last office visit. Advised patient to check and document his blood pressure twice a day (1st 2 hours after taking medication and 2nd before bed) and to call us Monday with his readings from the weekend.  Patient is not having any symptoms at this time and blood pressure is stable. Patient advised to continue to medication as prescribed and to give [place] a call if he experiences any dizziness or other symptoms. (per Nira Conn)

## 2015-06-23 ENCOUNTER — Encounter (HOSPITAL_COMMUNITY): Payer: Self-pay | Admitting: Internal Medicine

## 2015-06-23 ENCOUNTER — Ambulatory Visit (HOSPITAL_COMMUNITY)
Admission: RE | Admit: 2015-06-23 | Discharge: 2015-06-23 | Disposition: A | Discharge status: Home / Self Care | Payer: BLUE CROSS/BLUE SHIELD | Source: Ambulatory Visit | Attending: Internal Medicine | Admitting: Internal Medicine

## 2015-06-23 VITALS — BP 108/64 | HR 75 | Wt 206.2 lb

## 2015-06-23 DIAGNOSIS — Z87891 Personal history of nicotine dependence: Secondary | ICD-10-CM | POA: Diagnosis not present

## 2015-06-23 DIAGNOSIS — I5022 Chronic systolic (congestive) heart failure: Secondary | ICD-10-CM | POA: Insufficient documentation

## 2015-06-23 DIAGNOSIS — Z79899 Other long term (current) drug therapy: Secondary | ICD-10-CM | POA: Insufficient documentation

## 2015-06-23 DIAGNOSIS — M069 Rheumatoid arthritis, unspecified: Secondary | ICD-10-CM | POA: Insufficient documentation

## 2015-06-23 DIAGNOSIS — E119 Type 2 diabetes mellitus without complications: Secondary | ICD-10-CM | POA: Diagnosis not present

## 2015-06-23 DIAGNOSIS — I3139 Other pericardial effusion (noninflammatory): Secondary | ICD-10-CM

## 2015-06-23 DIAGNOSIS — G4733 Obstructive sleep apnea (adult) (pediatric): Secondary | ICD-10-CM | POA: Insufficient documentation

## 2015-06-23 DIAGNOSIS — Z7902 Long term (current) use of antithrombotics/antiplatelets: Secondary | ICD-10-CM | POA: Diagnosis not present

## 2015-06-23 DIAGNOSIS — Z7952 Long term (current) use of systemic steroids: Secondary | ICD-10-CM | POA: Diagnosis not present

## 2015-06-23 DIAGNOSIS — I4891 Unspecified atrial fibrillation: Secondary | ICD-10-CM | POA: Insufficient documentation

## 2015-06-23 DIAGNOSIS — I4892 Unspecified atrial flutter: Secondary | ICD-10-CM | POA: Diagnosis not present

## 2015-06-23 DIAGNOSIS — I319 Disease of pericardium, unspecified: Secondary | ICD-10-CM | POA: Diagnosis not present

## 2015-06-23 DIAGNOSIS — I447 Left bundle-branch block, unspecified: Secondary | ICD-10-CM | POA: Insufficient documentation

## 2015-06-23 DIAGNOSIS — I10 Essential (primary) hypertension: Secondary | ICD-10-CM | POA: Insufficient documentation

## 2015-06-23 DIAGNOSIS — I313 Pericardial effusion (noninflammatory): Secondary | ICD-10-CM

## 2015-06-23 DIAGNOSIS — I480 Paroxysmal atrial fibrillation: Secondary | ICD-10-CM | POA: Diagnosis not present

## 2015-06-23 LAB — BASIC METABOLIC PANEL
Anion gap: 4 — ABNORMAL LOW (ref 5–15)
BUN: 22 mg/dL — AB (ref 6–20)
CHLORIDE: 109 mmol/L (ref 101–111)
CO2: 24 mmol/L (ref 22–32)
CREATININE: 1.21 mg/dL (ref 0.61–1.24)
Calcium: 8.7 mg/dL — ABNORMAL LOW (ref 8.9–10.3)
GFR calc Af Amer: >60 mL/min (ref >60)
GFR calc non Af Amer: >60 mL/min (ref >60)
GLUCOSE: 149 mg/dL — AB (ref 65–99)
Potassium: 4.1 mmol/L (ref 3.5–5.1)
Sodium: 137 mmol/L (ref 135–145)

## 2015-06-23 MED ORDER — AMIODARONE HCL 400 MG PO TABS: 200.0000 mg | ORAL_TABLET | Freq: Every day | ORAL | Status: DC

## 2015-06-23 MED ORDER — CARVEDILOL 6.25 MG PO TABS: 6.2500 mg | ORAL_TABLET | Freq: Two times a day (BID) | ORAL | Status: DC

## 2015-06-23 NOTE — Progress Notes (Signed)
ADVANCED HF CLINIC NOTE  Patient ID: Ethan Alexander, male   DOB: 1951-06-11, 64 y.o.   MRN: [id number]    Primary Care: Ethan Alexander Primary HF Cardiologist: Ethan Alexander  HPI: Ethan Alexander is a 64 y.o. with hx of recently diagnosed systolic HF (EF 26%), RA (on chronic prednisone - followed by Dr. Ouida Alexander), DM2, LBBB admitted with CP and found    Was admitted on 03/26/15 with CP and found to have moderate pericardial effusion, with thickened RV free wall and EF 30%. CT of chest negative for PE. Troponin was negative. He was taken to cath lab on July 22nd and with no evidence of tamponade. Ethan Alexander catheter remained in place to guide therapy. On July 24 he developed A fib RVR and was started on amio + cardizem drip. He was cardioverted on July 24th and maintained a NSR and will continue on oral amiodarone. Due to significant pericardial effusion he was not placed on anticoagulant. He was not a candidate pericardial window due to chronic steroids.   The etiology of LV dysfunction was unclear so he had LHC that showed normal coronaries. Periods of apnea noted on July 26th with oxygen saturations down to the 70s. Home oxygen was arranged for night time. As he continued to improve swan ganz catheter was removed. He was placed on carvedilol, entresto, and spiro. He was discharged to home on 7/27 in stable condition.  He returns today for HF follow up. In August he presented to clinic with AFL but spontaneously converted. Amio increased to 400 bid. Bedside echo 40-45%. Pericardial effusion essentially resolved. Wore 30-day monitor. Amio decreased to 200 bid.  No palpitations in 6-8 weeks. Now seeing Dr. Amil Alexander in Rheumatology just started biologics. Prednisone being weaned - now back to 10mg  daily. Feels much better. Failed sleep study with AHI 76. Now on CPAP. Feels ok.  Denies dyspnea, orthopnea, PND or edema. SBP recently running high and Entresto increased to 49/51 last week. SBP now  110-120.    RHC 03/25/2015 RA 9  RV 33/6 (21)  PCWP 9  Thermo 5.8/2.5  Fick 4.2/1.8  LHC 03/28/2015--> normal cors   DC-CV 03/27/2015   03/29/2015 cMRI EF 36% mild hyperenhancement suggestive of myocarditis. Moderate pericardial effusion  Bedside echo 8/16: 40-45% pericardial effusion resolved    Past Medical History  Diagnosis Date  . Hypertension   . Arthritis   . Ruptured lumbar disc     L5-S1   x 2 disc  . Foot drop, left   . Osteomyelitis ([place]) 2010    left foot  . Osteomyelitis ([place]) 2008/2010    left  foot- metatarsal- per patient was MRSA  . Diabetes mellitus     diet controlled  . Renal disorder     Current Outpatient Prescriptions  Medication Sig Dispense Refill  . amiodarone (PACERONE) 400 MG tablet Take 0.5 tablets (200 mg total) by mouth 2 (two) times daily. 60 tablet 6  . apixaban (ELIQUIS) 5 MG TABS tablet Take 1 tablet (5 mg total) by mouth 2 (two) times daily. 60 tablet 6  . carvedilol (COREG) 3.125 MG tablet Take 1 tablet (3.125 mg total) by mouth 2 (two) times daily with a meal. 60 tablet 6  . folic acid (FOLVITE) 1 MG tablet Take 1 mg by mouth daily.    . Methotrexate Sodium (METHOTREXATE, PF,) 50 MG/2ML injection Inject 0.8 mLs as directed once a week. Inject 0.8 mLs as directed once a week.    Marland Kitchen  predniSONE (DELTASONE) 10 MG tablet Take 6 tablets (60 mg total) by mouth daily with breakfast. 180 tablet 2  . sacubitril-valsartan (ENTRESTO) 49-51 MG Take 1 tablet by mouth 2 (two) times daily. 60 tablet 3  . traMADol (ULTRAM) 50 MG tablet Take 50 mg by mouth daily as needed. Take 1 50mg  tablet daily as needed    . Vitamin D, Ergocalciferol, (DRISDOL) 50000 UNITS CAPS capsule Take 1,000 Units by mouth daily.    Marland Kitchen oxyCODONE (OXY IR/ROXICODONE) 5 MG immediate release tablet Take 1-2 tablets (5-10 mg total) by mouth every 3 (three) hours as needed. (Patient not taking: Reported on 06/23/2015) 80 tablet 0   No current facility-administered medications for  this encounter.    Allergies  Allergen Reactions  . Sulfa Drugs Cross Reactors Other (See Comments)    Flu like symptoms   . Tape     Tears his skin      Social History   Social History  . Marital Status: Married    Spouse Name: N/A  . Number of Children: N/A  . Years of Education: N/A   Occupational History  . Not on file.   Social History Main Topics  . Smoking status: Former Smoker -- 1.00 packs/day for 25 years    Quit date: 06/05/2006  . Smokeless tobacco: Never Used  . Alcohol Use: Yes     Comment: socially  . Drug Use: No  . Sexual Activity: Not on file   Other Topics Concern  . Not on file   Social History Narrative     No family history on file.  Filed Vitals:   06/23/15 1411  BP: 108/64  Pulse: 75  Weight: 206 lb 4 oz (93.554 kg)  SpO2: 98%    PHYSICAL EXAM: General:  Well appearing. No respiratory difficulty HEENT: normal Neck: supple. JVP flat . Carotids 2+ bilat; no bruits. No lymphadenopathy or thryomegaly appreciated. Cor: PMI nondisplaced. Irregularly irregular rate & rhythm. No rubs, gallops or murmurs. Lungs: CTA Abdomen: soft, nontender, nondistended. No hepatosplenomegaly. No bruits or masses. Good bowel sounds. Extremities: no cyanosis, clubbing, rash, edema. + diffuse RA changes Neuro: alert & oriented x 3, cranial nerves grossly intact. moves all 4 extremities w/o difficulty. Affect pleasant.   ASSESSMENT & PLAN: 1. Chronic systolic HF  -EF ~88-50% 2/77  - 03/28/15 LHC normal cors.  - cMRI 7/16 EF 36% 2. Pericardial effusion, moderate  --no tamponade by [place] 7/22  --resolved by echo 8/16 3. RA  4. DM2 with h/o osteomyelitis LLE  6. Chronic LBBB  7. Atrial fibrillation/flutter with RVR s/p DC-CV 7/24.  CHA2DS2VASC is 3. 9. OSA - severe with AHI 76. Now on CPAP  Doing well. NYHA I-II. Volume status looks good. Will increase carvedilol to 6.25 bid. Palpations have been quiet for nearly two months. Will review monitor  for any evidence of recurrent AFL. Decrease amio to 200 daily. If AFL recurs consider referral to EP for possible ablation. Continue Eliquis. Will see back in 6 weeks with echo.    Bensimhon, Daniel,MD 2:16 PM

## 2015-06-23 NOTE — Patient Instructions (Signed)
Increase Carvedilol to 6.25 mg Twice daily, you can take 2 of your 3.125 mg tablets Twice daily until you run out THEN we have sent in a new prescription for 6.25 mg tablets  Decrease Amiodarone to 200 mg daily  Labs today  Your physician has requested that you have an echocardiogram. Echocardiography is a painless test that uses sound waves to create images of your heart. It provides your doctor with information about the size and shape of your heart and how well your heart's chambers and valves are working. This procedure takes approximately one hour. There are no restrictions for this procedure.  Your physician recommends that you schedule a follow-up appointment in: 6 week with echocardiogram

## 2015-06-23 NOTE — Addendum Note (Signed)
Encounter addended by: Scarlette Calico, RN on: 06/23/2015  2:43 PM<BR>     Documentation filed: Patient Instructions Section, Dx Association, Orders

## 2015-08-03 ENCOUNTER — Encounter (HOSPITAL_COMMUNITY): Payer: Self-pay | Admitting: Internal Medicine

## 2015-08-03 ENCOUNTER — Ambulatory Visit (HOSPITAL_COMMUNITY)
Admission: RE | Admit: 2015-08-03 | Discharge: 2015-08-03 | Disposition: A | Discharge status: Home / Self Care | Payer: BLUE CROSS/BLUE SHIELD | Source: Ambulatory Visit | Attending: Internal Medicine | Admitting: Internal Medicine

## 2015-08-03 ENCOUNTER — Ambulatory Visit (HOSPITAL_BASED_OUTPATIENT_CLINIC_OR_DEPARTMENT_OTHER)
Admission: RE | Admit: 2015-08-03 | Discharge: 2015-08-03 | Disposition: A | Discharge status: Home / Self Care | Payer: BLUE CROSS/BLUE SHIELD | Source: Ambulatory Visit | Attending: Internal Medicine | Admitting: Internal Medicine

## 2015-08-03 VITALS — BP 144/82 | HR 61 | Wt 209.4 lb

## 2015-08-03 DIAGNOSIS — I5022 Chronic systolic (congestive) heart failure: Secondary | ICD-10-CM | POA: Insufficient documentation

## 2015-08-03 DIAGNOSIS — I10 Essential (primary) hypertension: Secondary | ICD-10-CM | POA: Insufficient documentation

## 2015-08-03 DIAGNOSIS — I319 Disease of pericardium, unspecified: Secondary | ICD-10-CM

## 2015-08-03 DIAGNOSIS — I3139 Other pericardial effusion (noninflammatory): Secondary | ICD-10-CM

## 2015-08-03 DIAGNOSIS — Z87891 Personal history of nicotine dependence: Secondary | ICD-10-CM | POA: Insufficient documentation

## 2015-08-03 DIAGNOSIS — E119 Type 2 diabetes mellitus without complications: Secondary | ICD-10-CM | POA: Insufficient documentation

## 2015-08-03 DIAGNOSIS — I313 Pericardial effusion (noninflammatory): Secondary | ICD-10-CM

## 2015-08-03 DIAGNOSIS — I4892 Unspecified atrial flutter: Secondary | ICD-10-CM | POA: Insufficient documentation

## 2015-08-03 DIAGNOSIS — I517 Cardiomegaly: Secondary | ICD-10-CM | POA: Diagnosis not present

## 2015-08-03 DIAGNOSIS — I340 Nonrheumatic mitral (valve) insufficiency: Secondary | ICD-10-CM | POA: Insufficient documentation

## 2015-08-03 DIAGNOSIS — I509 Heart failure, unspecified: Secondary | ICD-10-CM | POA: Diagnosis present

## 2015-08-03 NOTE — Progress Notes (Signed)
Patient ID: Ethan Alexander, male   DOB: April 25, 1951, 64 y.o.   MRN: [id number]    ADVANCED HF CLINIC NOTE  Patient ID: Ethan Alexander, male   DOB: Apr 21, 1951, 64 y.o.   MRN: [id number]    Primary Care: Trilby Drummer Primary HF Cardiologist: Bensihmon  HPI: Ethan Alexander is a 64 y.o. with hx of recently diagnosed systolic HF (EF [id number]), RA (on chronic prednisone - followed by Dr. Ouida Sills), DM2, LBBB admitted with CP and found    Was admitted on 03/26/15 with CP and found to have moderate pericardial effusion, with thickened RV free wall and EF 30%. CT of chest negative for PE. Troponin was negative. He was taken to cath lab on July 22nd and with no evidence of tamponade. [name] catheter remained in place to guide therapy. On July 24 he developed A fib RVR and was started on amio + cardizem drip. He was cardioverted on July 24th and maintained a NSR and will continue on oral amiodarone. Due to significant pericardial effusion he was not placed on anticoagulant. He was not a candidate pericardial window due to chronic steroids.   The etiology of LV dysfunction was unclear so he had LHC that showed normal coronaries. Periods of apnea noted on July 26th with oxygen saturations down to the 70s. Home oxygen was arranged for night time. As he continued to improve swan ganz catheter was removed. He was placed on carvedilol, entresto, and spiro. He was discharged to home on 7/27 in stable condition.  He returns today for HF follow up. In August he presented to clinic with AFL but spontaneously converted. Amio increased to 400 bid. Bedside echo 40-45%. Pericardial effusion essentially resolved. Wore 30-day monitor. Amio decreased to 200 bid.  Failed sleep study with AHI 76. Now on CPAP.  Returns to clinic for routine f/u. Feels very good. Weight up just 1-2 pounds. No dyspnea, orthopnea or OND. No palpitations in 6-8 weeks. On amio 200 daily. Seeing Dr. Amil Amen in Rheumatology and now on biologics.  Prednisone being weaned - now back to 10mg  daily. SBP 105-110 on Entresto 49/51. No dizziness. Easy bruising with apixaban.   Echo today: EF 45% in setting of LBBB and septal dyssynchrony. RV normal. No pericardial effusion.  Strain - 17%   RHC 03/25/2015 RA 9  RV 33/6 (21)  PCWP 9  Thermo 5.8/2.5  Fick 4.2/1.8  LHC 03/28/2015--> normal cors   DC-CV 03/27/2015   03/29/2015 cMRI EF 36% mild hyperenhancement suggestive of myocarditis. Moderate pericardial effusion  Bedside echo 8/16: 40-45% pericardial effusion resolved  Labs: (10/20) K 4.1 Creatinine 1.2    Past Medical History  Diagnosis Date  . Hypertension   . Arthritis   . Ruptured lumbar disc     L5-S1   x 2 disc  . Foot drop, left   . Osteomyelitis ([place]) 2010    left foot  . Osteomyelitis ([place]) 2008/2010    left  foot- metatarsal- per patient was MRSA  . Diabetes mellitus     diet controlled  . Renal disorder     Current Outpatient Prescriptions  Medication Sig Dispense Refill  . Abatacept (ORENCIA) 125 MG/ML SOSY Inject into the skin.    Marland Kitchen amiodarone (PACERONE) 400 MG tablet Take 0.5 tablets (200 mg total) by mouth daily. 60 tablet 6  . apixaban (ELIQUIS) 5 MG TABS tablet Take 1 tablet (5 mg total) by mouth 2 (two) times daily. 60 tablet 6  . carvedilol (COREG) 6.25 MG  tablet Take 1 tablet (6.25 mg total) by mouth 2 (two) times daily with a meal. 60 tablet 3  . folic acid (FOLVITE) 1 MG tablet Take 1 mg by mouth daily.    . Methotrexate Sodium (METHOTREXATE, PF,) 50 MG/2ML injection Inject 0.8 mLs as directed once a week. Inject 0.8 mLs as directed once a week.    . predniSONE (DELTASONE) 10 MG tablet Take 6 tablets (60 mg total) by mouth daily with breakfast. 180 tablet 2  . sacubitril-valsartan (ENTRESTO) 49-51 MG Take 1 tablet by mouth 2 (two) times daily. 60 tablet 3  . traMADol (ULTRAM) 50 MG tablet Take 50 mg by mouth daily as needed. Take 1 50mg  tablet daily as needed    . Vitamin D, Ergocalciferol,  (DRISDOL) 50000 UNITS CAPS capsule Take 1,000 Units by mouth daily.     No current facility-administered medications for this encounter.    Allergies  Allergen Reactions  . Sulfa Drugs Cross Reactors Other (See Comments)    Flu like symptoms   . Tape     Tears his skin      Social History   Social History  . Marital Status: Married    Spouse Name: N/A  . Number of Children: N/A  . Years of Education: N/A   Occupational History  . Not on file.   Social History Main Topics  . Smoking status: Former Smoker -- 1.00 packs/day for 25 years    Quit date: 06/05/2006  . Smokeless tobacco: Never Used  . Alcohol Use: Yes     Comment: socially  . Drug Use: No  . Sexual Activity: Not on file   Other Topics Concern  . Not on file   Social History Narrative     No family history on file.  Filed Vitals:   08/03/15 1442  BP: 144/82  Pulse: 61  Weight: 209 lb 6.4 oz (94.983 kg)  SpO2: 95%    PHYSICAL EXAM: General:  Well appearing. No respiratory difficulty HEENT: normal Neck: supple. JVP flat . Carotids 2+ bilat; no bruits. No lymphadenopathy or thryomegaly appreciated. Cor: PMI nondisplaced. Regular rate & rhythm. No rubs, gallops or murmurs. Lungs: CTA Abdomen: soft, nontender, nondistended. No hepatosplenomegaly. No bruits or masses. Good bowel sounds. Extremities: no cyanosis, clubbing, rash, edema. + diffuse RA changes. + boot on left foot  Neuro: alert & oriented x 3, cranial nerves grossly intact. moves all 4 extremities w/o difficulty. Affect pleasant.   ASSESSMENT & PLAN: 1. Chronic systolic HF  -EF 0000000 by echo today - 03/28/15 LHC normal cors.  - cMRI 7/16 EF 36% 2. Pericardial effusion, moderate  --no tamponade by RHC 7/22  --resolved by echo  3. RA  4. DM2 with h/o osteomyelitis LLE  6. Chronic LBBB  7. Atrial fibrillation/flutter with RVR s/p DC-CV 7/24.  (see ECG from 7/24 and 8/3 for AFL) CHA2DS2VASC is 3. --30 day event monitor with  no AF on amio 9. OSA - severe with AHI 76. Now on CPAP  Doing very well. NYHA I. Volume status looks good. I reviewed echo images personally an EF ~45% in setting of septal dyssynchrony with LBBB. RV and GLS are normal so I suspect LV function is back to normal. With low HR in 50-60s will not increase carvedilol. BP has been soft at home. Will continue current dose of Entresto and increase to full dose at next visit. Palpations have been quiet for > 2 months. I reviewed 30-day event monitor today and no AFL.  continue amio 200 daily. Continue Eliquis. Given relatively young age would like to stop amio but I suspect he may have recurrent AFL though with CPAP and improved LV function risk may be lower. Will refer to Dr. Rayann Heman to see if he agrees. If AFL would recur would prefer ablation .   Kylo Gavin,MD 2:49 PM

## 2015-08-03 NOTE — Progress Notes (Signed)
Echocardiogram 2D Echocardiogram has been performed.  Ethan Alexander 08/03/2015, 1:58 PM

## 2015-08-11 ENCOUNTER — Telehealth (HOSPITAL_COMMUNITY): Payer: Self-pay | Admitting: *Deleted

## 2015-08-11 NOTE — Telephone Encounter (Signed)
pts potassium high need to repeat bmet asap.  Pt can be added to lab schedule tomorrow or Monday   Left vm for pt to call back. I will try pt again tomorrow morning

## 2015-08-12 NOTE — Telephone Encounter (Signed)
Spoke wit pt and scheduled him for labs 08/15/15 at 10 am

## 2015-08-15 ENCOUNTER — Ambulatory Visit (HOSPITAL_COMMUNITY)
Admission: RE | Admit: 2015-08-15 | Discharge: 2015-08-15 | Disposition: A | Discharge status: Home / Self Care | Payer: BLUE CROSS/BLUE SHIELD | Source: Ambulatory Visit | Attending: Cardiology | Admitting: Cardiology

## 2015-08-15 DIAGNOSIS — I5022 Chronic systolic (congestive) heart failure: Secondary | ICD-10-CM | POA: Diagnosis not present

## 2015-08-15 LAB — BASIC METABOLIC PANEL
ANION GAP: 6 (ref 5–15)
BUN: 21 mg/dL — AB (ref 6–20)
CHLORIDE: 107 mmol/L (ref 101–111)
CO2: 26 mmol/L (ref 22–32)
Calcium: 9.1 mg/dL (ref 8.9–10.3)
Creatinine, Ser: 1.1 mg/dL (ref 0.61–1.24)
GFR calc Af Amer: >60 mL/min (ref >60)
GLUCOSE: 122 mg/dL — AB (ref 65–99)
POTASSIUM: 4.6 mmol/L (ref 3.5–5.1)
Sodium: 139 mmol/L (ref 135–145)

## 2015-08-16 ENCOUNTER — Other Ambulatory Visit (HOSPITAL_COMMUNITY): Payer: Self-pay | Admitting: Cardiology

## 2015-10-11 ENCOUNTER — Other Ambulatory Visit (HOSPITAL_COMMUNITY): Payer: Self-pay | Admitting: Internal Medicine

## 2015-10-19 ENCOUNTER — Other Ambulatory Visit (HOSPITAL_COMMUNITY): Payer: Self-pay | Admitting: Internal Medicine

## 2015-11-02 ENCOUNTER — Other Ambulatory Visit (HOSPITAL_COMMUNITY): Payer: Self-pay | Admitting: Internal Medicine

## 2015-11-08 ENCOUNTER — Other Ambulatory Visit (HOSPITAL_COMMUNITY): Payer: Self-pay | Admitting: Internal Medicine

## 2015-11-08 NOTE — Progress Notes (Signed)
Patient ID: Ethan Alexander, male   DOB: [birth year], 65 y.o.   MRN: [id number]    Advanced Heart Failure Clinic Note   Primary Care: Trilby Drummer Primary HF Cardiologist: Bensihmon  HPI: Ethan Alexander is a 65 y.o. with hx of recently diagnosed systolic HF (EF [id number]), RA (on chronic prednisone - followed by Dr. Ouida Sills), DM2, LBBB admitted with CP and found    Was admitted on 03/26/15 with CP and found to have moderate pericardial effusion, with thickened RV free wall and EF 30%. CT of chest negative for PE. Troponin was negative. He was taken to cath lab on July 22nd and with no evidence of tamponade. [name] catheter remained in place to guide therapy. On July 24 he developed A fib RVR and was started on amio + cardizem drip. He was cardioverted on July 24th and maintained a NSR and will continue on oral amiodarone. Due to significant pericardial effusion he was not placed on anticoagulant. He was not a candidate pericardial window due to chronic steroids.   The etiology of LV dysfunction was unclear so he had LHC that showed normal coronaries. Periods of apnea noted on July 26th with oxygen saturations down to the 70s. Home oxygen was arranged for night time. As he continued to improve swan ganz catheter was removed. He was placed on carvedilol, entresto, and spiro. He was discharged to home on 7/27 in stable condition.  He returns today for HF follow up. In August he presented to clinic with AFL but spontaneously converted. Amio increased to 400 bid. Bedside echo 40-45%. Pericardial effusion essentially resolved. Wore 30-day monitor. Amio decreased to 200 bid.  Failed sleep study with AHI 76. Now on CPAP.  Returns to clinic for regular follow up. No changes made at last visit, but echo and 30 day event monitor reviewed. No AFL on monitor and Echo with official reading of 40%, though thought be nearly back to normal, with normal RV and GLS, in setting of LBBB and septal dyssynchrony. Hasn't  felt palpitations in months, He has always felt when his heart is out of rhythm.  Still seeing Dr. Amil Amen in Rheumatology; Prednisone a 10mg  daily. No lightheadedness or dizziness. Uses an exercise bike 20-30 minutes without any SOB.  Mostly limited ankle (arthritis). Walks around a grocery store fine. No problem with stairs other than arthritis.   EKG: Sinus Brady 89, LAD, LBBB  Echo 1130/16: EF 45% in setting of LBBB and septal dyssynchrony. RV normal. No pericardial effusion.  Strain - 17%   RHC 03/25/2015 RA 9  RV 33/6 (21)  PCWP 9  Thermo 5.8/2.5  Fick 4.2/1.8  LHC 03/28/2015--> normal cors   DC-CV 03/27/2015   03/29/2015 cMRI EF 36% mild hyperenhancement suggestive of myocarditis. Moderate pericardial effusion  Bedside echo 8/16: 40-45% pericardial effusion resolved  Labs: (10/16) K 4.1 Creatinine 1.2 Labs: (2/17) K 4.4, Creatinine 1.1    Past Medical History  Diagnosis Date  . Hypertension   . Arthritis   . Ruptured lumbar disc     L5-S1   x 2 disc  . Foot drop, left   . Osteomyelitis ([place]) 2010    left foot  . Osteomyelitis ([place]) 2008/2010    left  foot- metatarsal- per patient was MRSA  . Diabetes mellitus     diet controlled  . Renal disorder     Current Outpatient Prescriptions  Medication Sig Dispense Refill  . Abatacept (ORENCIA) 125 MG/ML SOSY Inject 125 mg into the skin  once a week.     Marland Kitchen amiodarone (PACERONE) 400 MG tablet Take 0.5 tablets (200 mg total) by mouth daily. 60 tablet 6  . carvedilol (COREG) 6.25 MG tablet TAKE 1 TABLET TWICE DAILY WITH MEALS 60 tablet 3  . cholecalciferol (VITAMIN D) 1000 units tablet Take 1,000 Units by mouth daily.    Marland Kitchen ELIQUIS 5 MG TABS tablet TAKE 1 TABLET (5 MG TOTAL) BY MOUTH 2 (TWO) TIMES DAILY. 60 tablet 6  . ENTRESTO 49-51 MG TAKE 1 TABLET TWICE DAILY 60 tablet 3  . folic acid (FOLVITE) 1 MG tablet Take 1 mg by mouth daily.    . Methotrexate Sodium (METHOTREXATE, PF,) 50 MG/2ML injection Inject 0.8 mLs as  directed once a week. Inject 0.8 mLs as directed once a week.    . predniSONE (DELTASONE) 10 MG tablet Take 10 mg by mouth daily with breakfast.    . traMADol (ULTRAM) 50 MG tablet Take 50 mg by mouth daily as needed. Take 1 50mg  tablet daily as needed     No current facility-administered medications for this encounter.    Allergies  Allergen Reactions  . Sulfa Drugs Cross Reactors Other (See Comments)    Flu like symptoms   . Tape     Tears his skin      Social History   Social History  . Marital Status: Married    Spouse Name: N/A  . Number of Children: N/A  . Years of Education: N/A   Occupational History  . Not on file.   Social History Main Topics  . Smoking status: Former Smoker -- 1.00 packs/day for 25 years    Quit date: 06/05/2006  . Smokeless tobacco: Never Used  . Alcohol Use: Yes     Comment: socially  . Drug Use: No  . Sexual Activity: Not on file   Other Topics Concern  . Not on file   Social History Narrative     No family history on file.  Filed Vitals:   11/09/15 1124  BP: 114/76  Pulse: 60  Weight: 205 lb 12.8 oz (93.35 kg)  SpO2: 96%   Wt Readings from Last 3 Encounters:  11/09/15 205 lb 12.8 oz (93.35 kg)  08/03/15 209 lb 6.4 oz (94.983 kg)  06/23/15 206 lb 4 oz (93.554 kg)    PHYSICAL EXAM: General:  Well appearing. No respiratory difficulty HEENT: normal Neck: supple. JVP flat . Carotids 2+ bilat; no bruits. No thyromegaly or nodule noted Cor: PMI nondisplaced. RRR, no M/G/R appreciated. Lungs: CTAB, normal effort Abdomen: soft, NT, ND, no HSM. No bruits or masses. +BS  Extremities: no cyanosis, clubbing, rash, edema. + diffuse RA changes. + boot on left foot  Neuro: alert & oriented x 3, cranial nerves grossly intact. moves all 4 extremities w/o difficulty. Affect pleasant.   ASSESSMENT & PLAN: 1. Chronic systolic HF  -EF 0000000 [id number] - 03/28/15 LHC normal cors.  - cMRI 7/16 EF 36% 2. Pericardial effusion, moderate   --no tamponade by RHC 7/22  --resolved by echo  3. RA  4. DM2 with h/o osteomyelitis LLE  6. Chronic LBBB  7. Atrial fibrillation/flutter with RVR s/p DC-CV 7/24.  (see ECG from 7/24 and 8/3 for AFL) CHA2DS2VASC is 3. --30 day event monitor with no AF on amio -- EKG today in NSR.  9. OSA - severe with AHI 76. Using  CPAP  Doing very well. NYHA I. Volume status stable. Follow up 3 months with MD  He was not  referred to EP at last visit. Will follow through with that now for consideration of eventual AFL ablation if it recurs.  Would be helpful to have EP on board to discuss continued need for amiodarone.   Increase Entresto to 97/103. Recent labs stable so will recheck in 10-14 days with increase. Will check CMET and TSH + Free T4 as well on amiodarone.   Shirley Friar, PA-C 11:30 AM

## 2015-11-09 ENCOUNTER — Ambulatory Visit (HOSPITAL_COMMUNITY)
Admission: RE | Admit: 2015-11-09 | Discharge: 2015-11-09 | Disposition: A | Discharge status: Home / Self Care | Payer: BLUE CROSS/BLUE SHIELD | Source: Ambulatory Visit | Attending: Internal Medicine | Admitting: Internal Medicine

## 2015-11-09 VITALS — BP 114/76 | HR 60 | Wt 205.8 lb

## 2015-11-09 DIAGNOSIS — Z882 Allergy status to sulfonamides status: Secondary | ICD-10-CM | POA: Insufficient documentation

## 2015-11-09 DIAGNOSIS — Z7901 Long term (current) use of anticoagulants: Secondary | ICD-10-CM | POA: Diagnosis not present

## 2015-11-09 DIAGNOSIS — I480 Paroxysmal atrial fibrillation: Secondary | ICD-10-CM | POA: Diagnosis not present

## 2015-11-09 DIAGNOSIS — I4891 Unspecified atrial fibrillation: Secondary | ICD-10-CM | POA: Diagnosis not present

## 2015-11-09 DIAGNOSIS — I447 Left bundle-branch block, unspecified: Secondary | ICD-10-CM | POA: Diagnosis not present

## 2015-11-09 DIAGNOSIS — M069 Rheumatoid arthritis, unspecified: Secondary | ICD-10-CM | POA: Insufficient documentation

## 2015-11-09 DIAGNOSIS — Z8739 Personal history of other diseases of the musculoskeletal system and connective tissue: Secondary | ICD-10-CM | POA: Insufficient documentation

## 2015-11-09 DIAGNOSIS — Z87891 Personal history of nicotine dependence: Secondary | ICD-10-CM | POA: Insufficient documentation

## 2015-11-09 DIAGNOSIS — I5022 Chronic systolic (congestive) heart failure: Secondary | ICD-10-CM | POA: Diagnosis not present

## 2015-11-09 DIAGNOSIS — Z79899 Other long term (current) drug therapy: Secondary | ICD-10-CM | POA: Diagnosis not present

## 2015-11-09 DIAGNOSIS — G4733 Obstructive sleep apnea (adult) (pediatric): Secondary | ICD-10-CM | POA: Insufficient documentation

## 2015-11-09 DIAGNOSIS — E119 Type 2 diabetes mellitus without complications: Secondary | ICD-10-CM | POA: Diagnosis not present

## 2015-11-09 DIAGNOSIS — Z7952 Long term (current) use of systemic steroids: Secondary | ICD-10-CM | POA: Insufficient documentation

## 2015-11-09 DIAGNOSIS — I4892 Unspecified atrial flutter: Secondary | ICD-10-CM | POA: Diagnosis present

## 2015-11-09 DIAGNOSIS — I110 Hypertensive heart disease with heart failure: Secondary | ICD-10-CM | POA: Diagnosis not present

## 2015-11-09 DIAGNOSIS — R0683 Snoring: Secondary | ICD-10-CM

## 2015-11-09 MED ORDER — SACUBITRIL-VALSARTAN 97-103 MG PO TABS: 1.0000 | ORAL_TABLET | Freq: Two times a day (BID) | ORAL | Status: DC

## 2015-11-09 NOTE — Patient Instructions (Signed)
INCREASE Entresto to 97/103 mg, one tab twice a day  Labs needed in 10-14 days 11/18/2015 @ 1045 AM code 0010  You have been referred to [facility]- they will call you for appointment If you have not received a call by 11/18/15, please follow up at [phone]  Your physician recommends that you schedule a follow-up appointment in: 3 months with Dr. Haroldine Laws  Do the following things EVERYDAY: 1) Weigh yourself in the morning before breakfast. Write it down and keep it in a log. 2) Take your medicines as prescribed 3) Eat low salt foods-Limit salt (sodium) to 2000 mg per day.  4) Stay as active as you can everyday 5) Limit all fluids for the day to less than 2 liters 6)

## 2015-11-09 NOTE — Progress Notes (Signed)
Advanced Heart Failure Medication Review by a Pharmacist  Does the patient  feel that his/her medications are working for him/her?  yes  Has the patient been experiencing any side effects to the medications prescribed?  no  Does the patient measure his/her own blood pressure or blood glucose at home?  yes   Does the patient have any problems obtaining medications due to transportation or finances?   no  Understanding of regimen: good Understanding of indications: good Potential of compliance: good Patient understands to avoid NSAIDs. Patient understands to avoid decongestants.  Issues to address at subsequent visits: None   Pharmacist comments: 65 YO pleasant male presenting to HF clinic followup with his medication list.  Pt reports no SE or cost barriers to his current regimen.  Pt states that he took all of his medications except for eliquis this AM (refill sent in yesterday, pt to pick up today.  Pt reports subconjunctival hemorrhages in his eyes ~1x/month on eliquis.  Pt states that he had them before starting eliquis but since starting they have become more frequent. Pt has seen an ophthalmologist for the condition. Instructed pt to call clinic if they do not resolve or become more frequent or if he experiences other s/sx of bleeding    Time with patient: 5 min Preparation and documentation time: 5 min  Total time: 10 min

## 2015-11-16 ENCOUNTER — Encounter (HOSPITAL_BASED_OUTPATIENT_CLINIC_OR_DEPARTMENT_OTHER): Payer: BLUE CROSS/BLUE SHIELD | Attending: Surgery

## 2015-11-16 DIAGNOSIS — Z7952 Long term (current) use of systemic steroids: Secondary | ICD-10-CM | POA: Diagnosis not present

## 2015-11-16 DIAGNOSIS — I110 Hypertensive heart disease with heart failure: Secondary | ICD-10-CM | POA: Insufficient documentation

## 2015-11-16 DIAGNOSIS — I5022 Chronic systolic (congestive) heart failure: Secondary | ICD-10-CM | POA: Insufficient documentation

## 2015-11-16 DIAGNOSIS — L97511 Non-pressure chronic ulcer of other part of right foot limited to breakdown of skin: Secondary | ICD-10-CM | POA: Insufficient documentation

## 2015-11-16 DIAGNOSIS — G4730 Sleep apnea, unspecified: Secondary | ICD-10-CM | POA: Insufficient documentation

## 2015-11-16 DIAGNOSIS — Z89432 Acquired absence of left foot: Secondary | ICD-10-CM | POA: Insufficient documentation

## 2015-11-16 DIAGNOSIS — E11621 Type 2 diabetes mellitus with foot ulcer: Secondary | ICD-10-CM | POA: Insufficient documentation

## 2015-11-16 DIAGNOSIS — E1140 Type 2 diabetes mellitus with diabetic neuropathy, unspecified: Secondary | ICD-10-CM | POA: Insufficient documentation

## 2015-11-16 DIAGNOSIS — Z87891 Personal history of nicotine dependence: Secondary | ICD-10-CM | POA: Insufficient documentation

## 2015-11-16 DIAGNOSIS — M05572 Rheumatoid polyneuropathy with rheumatoid arthritis of left ankle and foot: Secondary | ICD-10-CM | POA: Insufficient documentation

## 2015-11-18 ENCOUNTER — Other Ambulatory Visit (HOSPITAL_COMMUNITY): Payer: BLUE CROSS/BLUE SHIELD

## 2015-11-19 ENCOUNTER — Other Ambulatory Visit (HOSPITAL_COMMUNITY): Payer: Self-pay | Admitting: Internal Medicine

## 2015-11-21 ENCOUNTER — Institutional Professional Consult (permissible substitution): Payer: BLUE CROSS/BLUE SHIELD | Admitting: Internal Medicine

## 2015-11-23 DIAGNOSIS — E11621 Type 2 diabetes mellitus with foot ulcer: Secondary | ICD-10-CM | POA: Diagnosis not present

## 2015-11-25 ENCOUNTER — Ambulatory Visit (HOSPITAL_COMMUNITY)
Admission: RE | Admit: 2015-11-25 | Discharge: 2015-11-25 | Disposition: A | Discharge status: Home / Self Care | Payer: BLUE CROSS/BLUE SHIELD | Source: Ambulatory Visit | Attending: Cardiology | Admitting: Cardiology

## 2015-11-25 DIAGNOSIS — I4892 Unspecified atrial flutter: Secondary | ICD-10-CM | POA: Diagnosis not present

## 2015-11-25 LAB — COMPREHENSIVE METABOLIC PANEL
ALT: 34 U/L (ref 17–63)
ANION GAP: 8 (ref 5–15)
AST: 23 U/L (ref 15–41)
Albumin: 3.2 g/dL — ABNORMAL LOW (ref 3.5–5.0)
Alkaline Phosphatase: 88 U/L (ref 38–126)
BUN: 22 mg/dL — ABNORMAL HIGH (ref 6–20)
CHLORIDE: 108 mmol/L (ref 101–111)
CO2: 24 mmol/L (ref 22–32)
Calcium: 9.1 mg/dL (ref 8.9–10.3)
Creatinine, Ser: 1.06 mg/dL (ref 0.61–1.24)
GFR calc non Af Amer: >60 mL/min (ref >60)
Glucose, Bld: 117 mg/dL — ABNORMAL HIGH (ref 65–99)
POTASSIUM: 4.8 mmol/L (ref 3.5–5.1)
SODIUM: 140 mmol/L (ref 135–145)
Total Bilirubin: 0.5 mg/dL (ref 0.3–1.2)
Total Protein: 6.6 g/dL (ref 6.5–8.1)

## 2015-11-25 LAB — TSH: TSH: 1.379 u[IU]/mL (ref 0.350–4.500)

## 2015-11-25 LAB — T4, FREE: FREE T4: 1.09 ng/dL (ref 0.61–1.12)

## 2015-11-29 ENCOUNTER — Ambulatory Visit (INDEPENDENT_AMBULATORY_CARE_PROVIDER_SITE_OTHER): Payer: BLUE CROSS/BLUE SHIELD | Admitting: Internal Medicine

## 2015-11-29 ENCOUNTER — Encounter: Payer: Self-pay | Admitting: Internal Medicine

## 2015-11-29 VITALS — BP 98/60 | HR 76 | Ht 78.0 in | Wt 220.0 lb

## 2015-11-29 DIAGNOSIS — Z79899 Other long term (current) drug therapy: Secondary | ICD-10-CM

## 2015-11-29 DIAGNOSIS — I480 Paroxysmal atrial fibrillation: Secondary | ICD-10-CM

## 2015-11-29 DIAGNOSIS — I429 Cardiomyopathy, unspecified: Secondary | ICD-10-CM

## 2015-11-29 DIAGNOSIS — I4892 Unspecified atrial flutter: Secondary | ICD-10-CM

## 2015-11-29 DIAGNOSIS — Z5181 Encounter for therapeutic drug level monitoring: Secondary | ICD-10-CM | POA: Diagnosis not present

## 2015-11-29 NOTE — Patient Instructions (Signed)
Medication Instructions: 1) Stop amiodarone  Labwork: - none  Procedures/Testing: - none  Follow-Up: - Dr. Caryl Comes will see you back on an as needed basis.  Any Additional Special Instructions Will Be Listed Below (If Applicable).     If you need a refill on your cardiac medications before your next appointment, please call your pharmacy.

## 2015-11-29 NOTE — Progress Notes (Signed)
ELECTROPHYSIOLOGY CONSULT NOTE  Patient ID: Ethan Alexander, MRN: [id number], DOB/AGE: Dec 11, 1950 65 y.o. Admit date: (Not on file) Date of Consult: 11/29/2015  Primary Physician: Thressa Sheller, MD Primary Cardiologist:[facility] Consulting Physician DB  Chief Complaint: Atrial flutter   HPI Ethan Alexander is a 65 y.o. male  Referred because of atrial fibrillation and atrial flutter.  He was initially evaluated 7/16 when he presented with chest pain and was found to have a moderate pericardial effusion. Ejection fraction is about 30%. During that hospitalization he developed rapid atrial fibrillation and was started on amiodarone; he has been continued on it. He has had a subsequent episode of atrial flutter 9/16. A 30 day event recorder 10/16 demonstrated no arrhythmia. He has had no intercurrent palpitations..  Left ventricular dysfunction has persisted although there've been some improvement. Most recent echo 11/16 was 40-45%. Notably the pericardial effusion has resolved   He has been maintained on amiodarone a relatively high doses  He has chronic LBBB  He has rheumatoid arthritis and has had such for 30 years and has been on prednisone. He has had multiple surgeries.  Past Medical History  Diagnosis Date  . Hypertension   . Arthritis   . Ruptured lumbar disc     L5-S1   x 2 disc  . Foot drop, left   . Osteomyelitis ([place]) 2010    left foot  . Osteomyelitis ([place]) 2008/2010    left  foot- metatarsal- per patient was MRSA  . Diabetes mellitus     diet controlled  . Renal disorder       Surgical History:  Past Surgical History  Procedure Laterality Date  . Foot surgery  2008/2010    x 2  left foot/ osteomyelitis  . Achilles tendon surgery      left  . Eye surgery      cataract ectraction with IOL-  bilateral  . Total hip arthroplasty  06/11/2012    Procedure: TOTAL HIP ARTHROPLASTY;  Surgeon: Gearlean Alf, MD;  Location: [facility];  Service: Orthopedics;   Laterality: Left;  . Cardiac catheterization N/A 03/25/2015    Procedure: Right Heart Cath;  Surgeon: Leonie Man, MD;  Location: [facility];  Service: Cardiovascular;  Laterality: N/A;  . Cardioversion N/A 03/27/2015    Procedure: CARDIOVERSION;  Surgeon: Jolaine Artist, MD;  Location: [facility];  Service: Cardiovascular;  Laterality: N/A;  . Cardiac catheterization N/A 03/28/2015    Procedure: Left Heart Cath;  Surgeon: Jolaine Artist, MD;  Location: [facility];  Service: Cardiovascular;  Laterality: N/A;     Home Meds: Prior to Admission medications   Medication Sig Start Date End Date Taking? Authorizing Provider  Abatacept (ORENCIA) 125 MG/ML SOSY Inject 125 mg into the skin once a week.    Yes Historical Provider, MD  amiodarone (PACERONE) 400 MG tablet Take 0.5 tablets (200 mg total) by mouth daily. 06/23/15  Yes Shaune Pascal Bensimhon, MD  carvedilol (COREG) 6.25 MG tablet TAKE 1 TABLET TWICE DAILY WITH MEALS 10/19/15  Yes Jolaine Artist, MD  cholecalciferol (VITAMIN D) 1000 units tablet Take 1,000 Units by mouth daily.   Yes Historical Provider, MD  ELIQUIS 5 MG TABS tablet TAKE 1 TABLET (5 MG TOTAL) BY MOUTH 2 (TWO) TIMES DAILY. 11/08/15  Yes Jolaine Artist, MD  folic acid (FOLVITE) 1 MG tablet Take 1 mg by mouth daily.   Yes Historical Provider, MD  metFORMIN (GLUCOPHAGE-XR) 500 MG  24 hr tablet Take 500 mg by mouth daily with breakfast.  10/13/15  Yes Historical Provider, MD  Methotrexate Sodium (METHOTREXATE, PF,) 50 MG/2ML injection Inject 0.8 mLs as directed once a week. Inject 0.8 mLs as directed once a week.   Yes Historical Provider, MD  predniSONE (DELTASONE) 10 MG tablet Take 1 tablet (10 mg total) by mouth daily with breakfast. 11/21/15  Yes Jolaine Artist, MD  sacubitril-valsartan (ENTRESTO) 97-103 MG Take 1 tablet by mouth 2 (two) times daily. 11/09/15  Yes Shirley Friar, PA-C  traMADol (ULTRAM) 50 MG tablet Take 50 mg by mouth daily as  needed. Take 1 50mg  tablet daily as needed 10/25/14  Yes Historical Provider, MD    Allergies:  Allergies  Allergen Reactions  . Sulfa Drugs Cross Reactors Other (See Comments)    Flu like symptoms   . Tape     Tears his skin  . Sulfa Antibiotics Other (See Comments)    Social History   Social History  . Marital Status: Married    Spouse Name: N/A  . Number of Children: N/A  . Years of Education: N/A   Occupational History  . Not on file.   Social History Main Topics  . Smoking status: Former Smoker -- 1.00 packs/day for 25 years    Quit date: 06/05/2006  . Smokeless tobacco: Never Used  . Alcohol Use: Yes     Comment: socially  . Drug Use: No  . Sexual Activity: Not on file   Other Topics Concern  . Not on file   Social History Narrative     Family History  Problem Relation Age of Onset  . Heart disease Father      ROS:  Please see the history of present illness.     All other systems reviewed and negative.    Physical Exam   Blood pressure 98/60, pulse 76, height 6\' 6"  (1.981 m), weight 220 lb (99.791 kg). General: Well developed, well nourished male in no acute distress. Head: Normocephalic, atraumatic, sclera non-icteric, no xanthomas, nares are without discharge. EENT: normal  Lymph Nodes:  none Neck: Negative for carotid bruits. JVD not elevated. Back:without scoliosis kyphosis Lungs: Clear bilaterally to auscultation without wheezes, rales, or rhonchi. Breathing is unlabored. Heart: RRR with S1 S2. 2/6 systolic  murmur . No rubs, or gallops appreciated. Abdomen: Soft, non-tender, non-distended with normoactive bowel sounds. No hepatomegaly. No rebound/guarding. No obvious abdominal masses. Msk:  Strength and tone appear normal for age. Extremities: No clubbing or cyanosis. Unable to assess edema as he is wearing protective shoes.   Distal pedal pulses are 2+ and equal bilaterally. Skin: Warm and Dry Neuro: Alert and oriented X 3. CN III-XII intact  Grossly normal sensory and motor function . Psych:  Responds to questions appropriately with a normal affect.      Labs: Cardiac Enzymes No results for input(s): CKTOTAL, CKMB, TROPONINI in the last 72 hours. CBC Lab Results  Component Value Date   WBC 7.4 06/02/2015   HGB 12.0* 06/02/2015   HCT 36.7* 06/02/2015   MCV 98.9 06/02/2015   PLT 263 06/02/2015   PROTIME: No results for input(s): LABPROT, INR in the last 72 hours. Chemistry  Recent Labs Lab 11/25/15 1046  NA 140  K 4.8  CL 108  CO2 24  BUN 22*  CREATININE 1.06  CALCIUM 9.1  PROT 6.6  BILITOT 0.5  ALKPHOS 88  ALT 34  AST 23  GLUCOSE 117*   Lipids No results  found for: CHOL, HDL, LDLCALC, TRIG BNP No results found for: PROBNP Thyroid Function Tests: No results for input(s): TSH, T4TOTAL, T3FREE, THYROIDAB in the last 72 hours.  Invalid input(s): FREET3 Miscellaneous No results found for: DDIMER  Radiology/Studies:  No results found.  EKG:    sinus rhythm at 59 Intervals 19/16/48 (left bundle branch block   Assessment and Plan: Atrial fibrillation and flutter  Left bundle branch block  Rheumatoid arthritis on chronic steroids  Cardiomyopathy-resolved   The patient has had atrial fibrillation and atrial flutter last summer in the temporal context of a pericardial effusion which has subsequently resolved. Left ventricular dysfunction is also improved. This suggests an inflammatory condition as the triggering event and raises the possibility that the atrial fibrillation and flutter were secondary to that inflammatory event. That having been said, it seems reasonable to me at this late juncture to discontinue the amiodarone and see what happens with recurrent arrhythmia. I would keep him on his ELIQUIS at this time  It is the patient's report from Dr. Reine Just that the thoughts are that his left ventricular function is continued to improve. I can certainly safer me I would be hesitant to undertake  device implantation in this gentleman with 30 year steroid exposure unless the needs were dire      Virl Axe

## 2015-11-30 DIAGNOSIS — E11621 Type 2 diabetes mellitus with foot ulcer: Secondary | ICD-10-CM | POA: Diagnosis not present

## 2015-12-09 ENCOUNTER — Other Ambulatory Visit (HOSPITAL_COMMUNITY): Payer: Self-pay | Admitting: *Deleted

## 2015-12-09 NOTE — Telephone Encounter (Signed)
LATE ENTRY:  Received note from [facility], pt needs clearance for left foot pan metatarsal head resection w/reginal block w/iv sedation.  Per Dr Haroldine Laws pt cleared for surgery, form was faxed back to [facility] on 12/06/15

## 2016-01-19 ENCOUNTER — Other Ambulatory Visit (HOSPITAL_COMMUNITY): Payer: Self-pay | Admitting: *Deleted

## 2016-01-19 MED ORDER — CARVEDILOL 6.25 MG PO TABS: 6.2500 mg | ORAL_TABLET | Freq: Two times a day (BID) | ORAL | Status: DC

## 2016-01-30 ENCOUNTER — Other Ambulatory Visit (HOSPITAL_COMMUNITY): Payer: Self-pay | Admitting: Internal Medicine

## 2016-02-06 ENCOUNTER — Encounter (HOSPITAL_COMMUNITY): Payer: Self-pay | Admitting: Internal Medicine

## 2016-02-06 ENCOUNTER — Ambulatory Visit (HOSPITAL_COMMUNITY)
Admission: RE | Admit: 2016-02-06 | Discharge: 2016-02-06 | Disposition: A | Discharge status: Home / Self Care | Payer: BLUE CROSS/BLUE SHIELD | Source: Ambulatory Visit | Attending: Internal Medicine | Admitting: Internal Medicine

## 2016-02-06 VITALS — BP 118/78 | HR 68 | Wt 210.8 lb

## 2016-02-06 DIAGNOSIS — Z7984 Long term (current) use of oral hypoglycemic drugs: Secondary | ICD-10-CM | POA: Insufficient documentation

## 2016-02-06 DIAGNOSIS — Z8739 Personal history of other diseases of the musculoskeletal system and connective tissue: Secondary | ICD-10-CM | POA: Insufficient documentation

## 2016-02-06 DIAGNOSIS — Z79899 Other long term (current) drug therapy: Secondary | ICD-10-CM | POA: Insufficient documentation

## 2016-02-06 DIAGNOSIS — M069 Rheumatoid arthritis, unspecified: Secondary | ICD-10-CM | POA: Insufficient documentation

## 2016-02-06 DIAGNOSIS — G4733 Obstructive sleep apnea (adult) (pediatric): Secondary | ICD-10-CM | POA: Diagnosis not present

## 2016-02-06 DIAGNOSIS — I4892 Unspecified atrial flutter: Secondary | ICD-10-CM

## 2016-02-06 DIAGNOSIS — Z882 Allergy status to sulfonamides status: Secondary | ICD-10-CM | POA: Diagnosis not present

## 2016-02-06 DIAGNOSIS — E119 Type 2 diabetes mellitus without complications: Secondary | ICD-10-CM | POA: Insufficient documentation

## 2016-02-06 DIAGNOSIS — I110 Hypertensive heart disease with heart failure: Secondary | ICD-10-CM | POA: Insufficient documentation

## 2016-02-06 DIAGNOSIS — Z87891 Personal history of nicotine dependence: Secondary | ICD-10-CM | POA: Insufficient documentation

## 2016-02-06 DIAGNOSIS — Z8249 Family history of ischemic heart disease and other diseases of the circulatory system: Secondary | ICD-10-CM | POA: Diagnosis not present

## 2016-02-06 DIAGNOSIS — Z7901 Long term (current) use of anticoagulants: Secondary | ICD-10-CM | POA: Diagnosis not present

## 2016-02-06 DIAGNOSIS — I447 Left bundle-branch block, unspecified: Secondary | ICD-10-CM | POA: Insufficient documentation

## 2016-02-06 DIAGNOSIS — Z7952 Long term (current) use of systemic steroids: Secondary | ICD-10-CM | POA: Insufficient documentation

## 2016-02-06 DIAGNOSIS — I4891 Unspecified atrial fibrillation: Secondary | ICD-10-CM | POA: Diagnosis not present

## 2016-02-06 DIAGNOSIS — I5022 Chronic systolic (congestive) heart failure: Secondary | ICD-10-CM | POA: Diagnosis not present

## 2016-02-06 MED ORDER — CARVEDILOL 6.25 MG PO TABS: 9.3750 mg | ORAL_TABLET | Freq: Two times a day (BID) | ORAL | Status: DC

## 2016-02-06 NOTE — Addendum Note (Signed)
Encounter addended by: Scarlette Calico, RN on: 02/06/2016 11:30 AM<BR>     Documentation filed: [employer], Patient Instructions Section, Orders

## 2016-02-06 NOTE — Patient Instructions (Signed)
Increase Carvedilol to 9.375 mg (1 & 1/2 tabs) Twice daily   Your physician has requested that you have an echocardiogram. Echocardiography is a painless test that uses sound waves to create images of your heart. It provides your doctor with information about the size and shape of your heart and how well your heart's chambers and valves are working. This procedure takes approximately one hour. There are no restrictions for this procedure.  We will contact you in 6 months to schedule your next appointment.

## 2016-02-06 NOTE — Progress Notes (Signed)
Patient ID: Ethan Alexander, male   DOB: 07/12/1951, 65 y.o.   MRN: [id number] Patient ID: Ethan Alexander, male   DOB: 04/14/1951, 65 y.o.   MRN: [id number]    Advanced Heart Failure Clinic Note   Primary Care: Trilby Drummer Primary HF Cardiologist: Bensihmon  HPI: Ethan Alexander is a 65 y.o. with hx of recently diagnosed systolic HF (EF [id number]), RA (on chronic prednisone - followed by Dr. Ouida Sills), DM2, LBBB admitted with CP and found    Was admitted on 03/26/15 with CP and found to have moderate pericardial effusion, with thickened RV free wall and EF 30%. CT of chest negative for PE. Troponin was negative. He was taken to cath lab on July 22nd and with no evidence of tamponade. Ethan Alexander remained in place to guide therapy. On July 24 he developed A fib RVR and was started on amio + cardizem drip. He was cardioverted on July 24th and maintained a NSR and will continue on oral amiodarone. Due to significant pericardial effusion he was not placed on anticoagulant. He was not a candidate pericardial window due to chronic steroids.   The etiology of LV dysfunction was unclear so he had LHC that showed normal coronaries. Periods of apnea noted on July 26th with oxygen saturations down to the 70s. Home oxygen was arranged for night time. As he continued to improve [place] Alexander was removed. He was placed on carvedilol, entresto, and spiro. He was discharged to home on 7/27 in stable condition.  He returns today for HF follow up. In August he presented to clinic with AFL but spontaneously converted. Amio increased to 400 bid. Bedside echo 40-45%. Pericardial effusion essentially resolved. Wore 30-day monitor. Amio decreased to 200 bid.  Failed sleep study with AHI 76. Now on CPAP.  Returns to clinic for regular follow up. Recently had pan-metatarsal resection on the left foot at [facility] so activity was down but now back to riding his bike and other activities without problem. No  SOB/edema/ortjopnea or PND. At last visit increased Entresto to 97/103 bid. Saw Dr. Caryl Comes in September and amio stopped. No palpitations since that time..  Still seeing Dr. Amil Amen in Rheumatology; Prednisone a 10mg  daily. No lightheadedness or dizziness.   EKG: Sinus Brady 89, LAD, LBBB  Echo 08/03/15: EF 40-45% in setting of LBBB and septal dyssynchrony. RV normal. No pericardial effusion.  Strain - 17%   RHC 03/25/2015 RA 9  RV 33/6 (21)  PCWP 9  Thermo 5.8/2.5  Fick 4.2/1.8  LHC 03/28/2015--> normal cors   DC-CV 03/27/2015   03/29/2015 cMRI EF 36% mild hyperenhancement suggestive of myocarditis. Moderate pericardial effusion  Bedside echo 8/16: 40-45% pericardial effusion resolved  9/16: 30-day event monitor no AFL  Labs: (10/16) K 4.1 Creatinine 1.2 Labs: (2/17) K 4.4, Creatinine 1.1    Past Medical History  Diagnosis Date  . Hypertension   . Arthritis   . Ruptured lumbar disc     L5-S1   x 2 disc  . Foot drop, left   . Osteomyelitis ([place]) 2010    left foot  . Osteomyelitis ([place]) 2008/2010    left  foot- metatarsal- per patient was MRSA  . Diabetes mellitus     diet controlled  . Renal disorder     Current Outpatient Prescriptions  Medication Sig Dispense Refill  . Abatacept (ORENCIA) 125 MG/ML SOSY Inject 125 mg into the skin once a week.     . carvedilol (COREG) 6.25 MG tablet  Take 1 tablet (6.25 mg total) by mouth 2 (two) times daily with a meal. 60 tablet 3  . cholecalciferol (VITAMIN D) 1000 units tablet Take 1,000 Units by mouth daily.    Marland Kitchen ELIQUIS 5 MG TABS tablet TAKE 1 TABLET (5 MG TOTAL) BY MOUTH 2 (TWO) TIMES DAILY. 60 tablet 6  . folic acid (FOLVITE) 1 MG tablet Take 1 mg by mouth daily.    . metFORMIN (GLUCOPHAGE-XR) 500 MG 24 hr tablet Take 500 mg by mouth daily with breakfast.   0  . Methotrexate Sodium (METHOTREXATE, PF,) 50 MG/2ML injection Inject 0.8 mLs as directed once a week. Inject 0.8 mLs as directed once a week.    . predniSONE  (DELTASONE) 10 MG tablet Take 1 tablet (10 mg total) by mouth daily with breakfast. 90 tablet 2  . sacubitril-valsartan (ENTRESTO) 97-103 MG Take 1 tablet by mouth 2 (two) times daily. 60 tablet 6  . traMADol (ULTRAM) 50 MG tablet Take 50 mg by mouth daily as needed. Take 1 50mg  tablet daily as needed     No current facility-administered medications for this encounter.    Allergies  Allergen Reactions  . Sulfa Drugs Cross Reactors Other (See Comments)    Flu like symptoms   . Tape     Tears his skin  . Sulfa Antibiotics Other (See Comments)      Social History   Social History  . Marital Status: Married    Spouse Name: N/A  . Number of Children: N/A  . Years of Education: N/A   Occupational History  . Not on file.   Social History Main Topics  . Smoking status: Former Smoker -- 1.00 packs/day for 25 years    Quit date: 06/05/2006  . Smokeless tobacco: Never Used  . Alcohol Use: Yes     Comment: socially  . Drug Use: No  . Sexual Activity: Not on file   Other Topics Concern  . Not on file   Social History Narrative      Family History  Problem Relation Age of Onset  . Heart disease Father     Filed Vitals:   02/06/16 1103  BP: 118/78  Pulse: 68  Weight: 210 lb 12 oz (95.596 kg)  SpO2: 98%   Wt Readings from Last 3 Encounters:  02/06/16 210 lb 12 oz (95.596 kg)  11/29/15 220 lb (99.791 kg)  11/09/15 205 lb 12.8 oz (93.35 kg)    PHYSICAL EXAM: General:  Well appearing. No respiratory difficulty HEENT: normal Neck: supple. JVP flat . Carotids 2+ bilat; no bruits. No thyromegaly or nodule noted Cor: PMI nondisplaced. RRR, no M/G/R appreciated. Lungs: CTAB, normal effort Abdomen: soft, NT, ND, no HSM. No bruits or masses. +BS  Extremities: no cyanosis, clubbing, rash, edema. + diffuse RA changes. Neuro: alert & oriented x 3, cranial nerves grossly intact. moves all 4 extremities w/o difficulty. Affect pleasant.   ASSESSMENT & PLAN: 1. Chronic  systolic HF  -EF [id number] - 03/28/15 LHC normal cors.  - cMRI 7/16 EF 36% 2. Pericardial effusion, moderate  --no tamponade by RHC 7/22  --resolved by echo  3. RA  4. DM2 with h/o osteomyelitis LLE  6. Chronic LBBB  7. Atrial fibrillation/flutter with RVR s/p DC-CV 7/24.  (see ECG from 7/24 and 8/3 for AFL) CHA2DS2VASC is 3. --30 day event monitor with no AF on amio in 9/16 --Remains in NSR  --Has seen Dr. Caryl Comes in 3/17 and amio stopped 9. OSA - severe  with AHI 76. Using  CPAP  Doing very well. NYHA I. Volume status stable. Will increase carvedilol to 9.375 bid and see if he can tolerate. Will repeat echo to look for further LV recover. Now off amio. Continue Eliquis. Will continue to watch for recurrent AFL.    Glori Bickers, MD  11:17 AM

## 2016-02-14 ENCOUNTER — Other Ambulatory Visit (HOSPITAL_COMMUNITY): Payer: Self-pay | Admitting: Internal Medicine

## 2016-02-22 ENCOUNTER — Other Ambulatory Visit (HOSPITAL_COMMUNITY): Payer: BLUE CROSS/BLUE SHIELD

## 2016-03-08 ENCOUNTER — Other Ambulatory Visit: Payer: Self-pay

## 2016-03-08 ENCOUNTER — Encounter (INDEPENDENT_AMBULATORY_CARE_PROVIDER_SITE_OTHER): Payer: Self-pay

## 2016-03-08 ENCOUNTER — Ambulatory Visit (HOSPITAL_COMMUNITY): Payer: BLUE CROSS/BLUE SHIELD | Attending: Internal Medicine

## 2016-03-08 DIAGNOSIS — I071 Rheumatic tricuspid insufficiency: Secondary | ICD-10-CM | POA: Insufficient documentation

## 2016-03-08 DIAGNOSIS — E119 Type 2 diabetes mellitus without complications: Secondary | ICD-10-CM | POA: Insufficient documentation

## 2016-03-08 DIAGNOSIS — I447 Left bundle-branch block, unspecified: Secondary | ICD-10-CM | POA: Diagnosis not present

## 2016-03-08 DIAGNOSIS — I5022 Chronic systolic (congestive) heart failure: Secondary | ICD-10-CM | POA: Diagnosis not present

## 2016-03-08 DIAGNOSIS — M069 Rheumatoid arthritis, unspecified: Secondary | ICD-10-CM | POA: Insufficient documentation

## 2016-03-08 DIAGNOSIS — I509 Heart failure, unspecified: Secondary | ICD-10-CM | POA: Diagnosis present

## 2016-03-08 DIAGNOSIS — I110 Hypertensive heart disease with heart failure: Secondary | ICD-10-CM | POA: Diagnosis not present

## 2016-03-08 DIAGNOSIS — Z87891 Personal history of nicotine dependence: Secondary | ICD-10-CM | POA: Insufficient documentation

## 2016-03-08 DIAGNOSIS — I340 Nonrheumatic mitral (valve) insufficiency: Secondary | ICD-10-CM | POA: Insufficient documentation

## 2016-03-08 LAB — ECHOCARDIOGRAM COMPLETE
AVLVOTPG: 4 mmHg
CHL CUP TV REG PEAK VELOCITY: 266 cm/s
E decel time: 310 msec
EERAT: 6.48
FS: 23 % — AB (ref 28–44)
IVS/LV PW RATIO, ED: 0.98
LA diam index: 1.65 cm/m2
LA vol: 19 mL
LASIZE: 38 mm
LAVOLA4C: 18 mL
LAVOLIN: 8.2 mL/m2
LEFT ATRIUM END SYS DIAM: 38 mm
LV PW d: 14.5 mm — AB (ref 0.6–1.1)
LV SIMPSON'S DISK: 50
LV dias vol index: 50 mL/m2
LV dias vol: 115 mL (ref 62–150)
LV e' LATERAL: 7.02 cm/s
LV sys vol index: 25 mL/m2
LVEEAVG: 6.48
LVEEMED: 6.48
LVOT SV: 58 mL
LVOT VTI: 16.9 cm
LVOT area: 3.46 cm2
LVOT diameter: 21 mm
LVOTPV: 97 cm/s
LVSYSVOL: 58 mL (ref 21–61)
MV Dec: 310
MVPKAVEL: 68.3 m/s
MVPKEVEL: 45.5 m/s
Stroke v: 57 ml
TDI e' lateral: 7.02
TDI e' medial: 5.81
TR max vel: 266 cm/s

## 2016-03-27 ENCOUNTER — Telehealth (HOSPITAL_COMMUNITY): Payer: Self-pay

## 2016-03-27 NOTE — Telephone Encounter (Signed)
Patient called CHF clinic triage line asking for recent results of a test. Unsure what test, received a VM last week to call for results. No phone note in chart, most recent test was echo which was reviewed with patient (stable results, slightly improved EF since 07/2015). No other questions/concerns at this time.  Renee Pain

## 2016-06-10 ENCOUNTER — Other Ambulatory Visit (HOSPITAL_COMMUNITY): Payer: Self-pay | Admitting: Internal Medicine

## 2016-06-22 ENCOUNTER — Other Ambulatory Visit (HOSPITAL_COMMUNITY): Payer: Self-pay | Admitting: Student

## 2016-12-09 IMAGING — CT CT ANGIO CHEST
2 of 6 series · 18 of 36 positions shown · IV contrast (OMNIPAQUE 350)
Comparison: None.

CLINICAL DATA: Central chest pain for 1 week, tachycardia,
hypoxemia, shortness of breath.

EXAM:
CT ANGIOGRAPHY CHEST WITH CONTRAST
TECHNIQUE: Multidetector CT imaging of the chest was performed using the
standard protocol during bolus administration of intravenous
contrast. Multiplanar CT image reconstructions and MIPs were
obtained to evaluate the vascular anatomy.
CONTRAST:  100mL OMNIPAQUE IOHEXOL 350 MG/ML SOLN

[Series 6: thins for pacs · axial · 0.68mm/px · z∈[-266,-6]mm · 17 of 290 slices shown]
[im 15/290  lung]
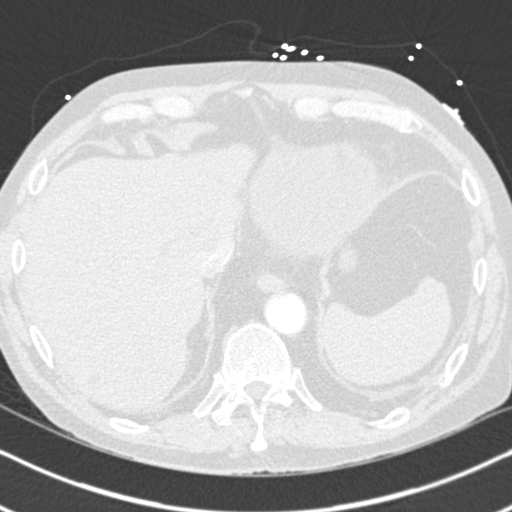
[im 29/290  mediastinal]
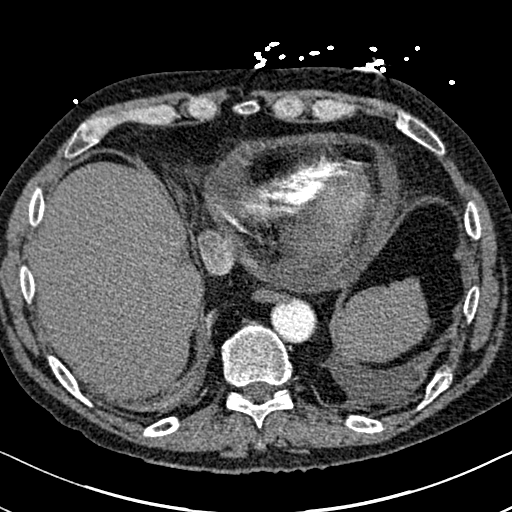
[im 44/290  lung]
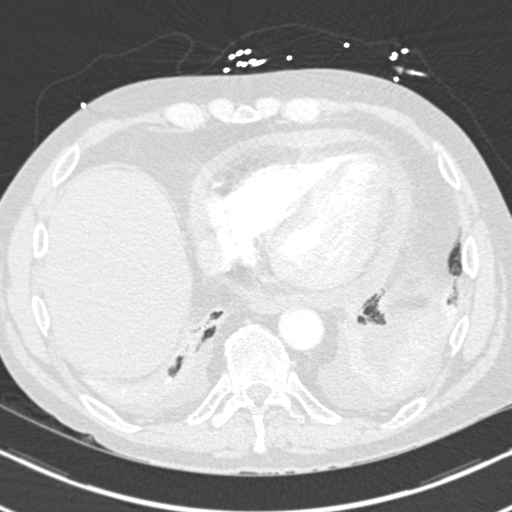
[im 58/290  mediastinal]
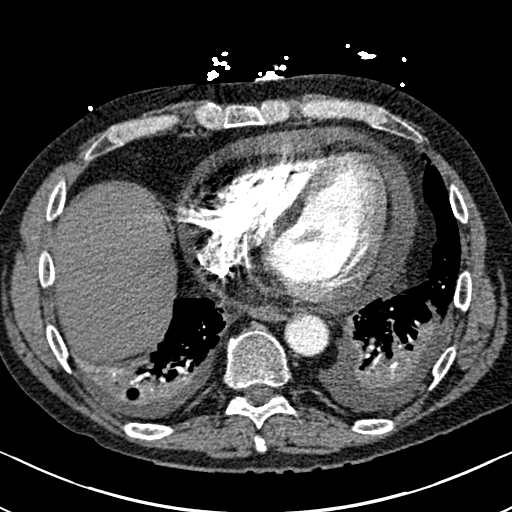
[im 87/290  lung]
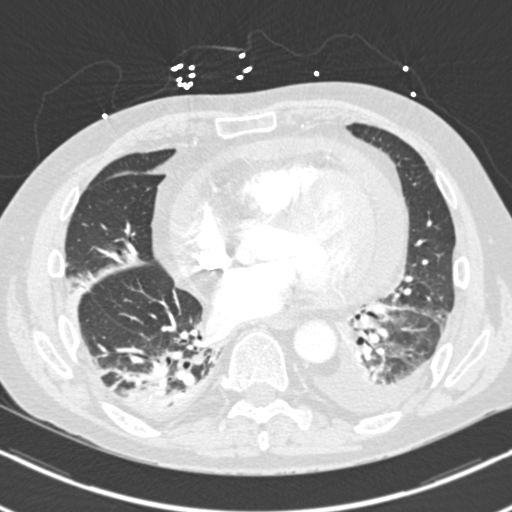
[im 102/290  mediastinal]
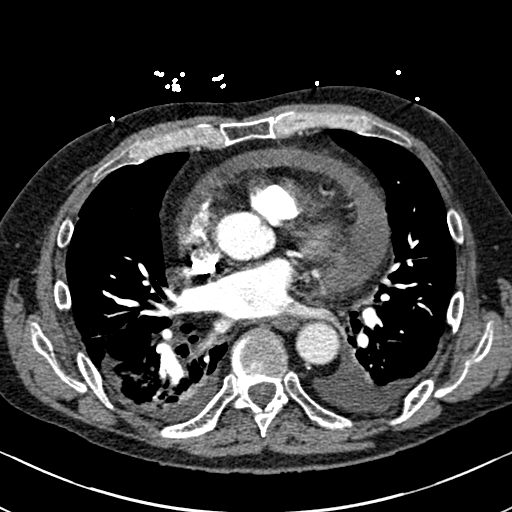
[im 116/290  lung]
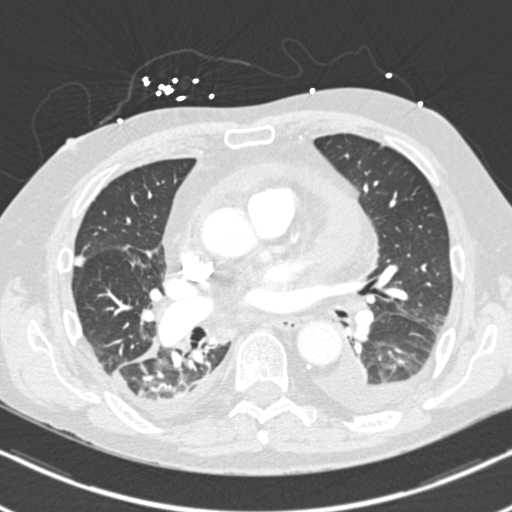
[im 131/290  mediastinal]
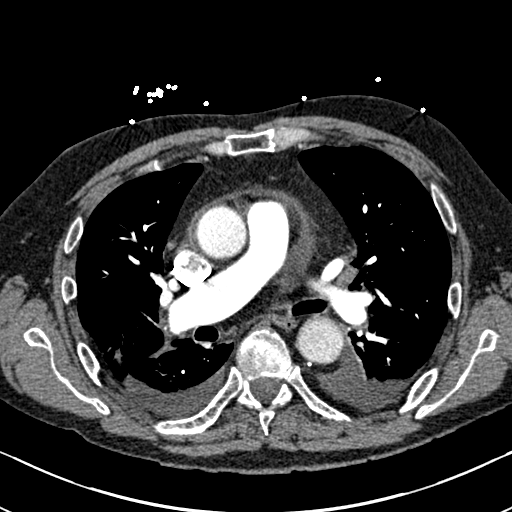
[im 145/290  lung]
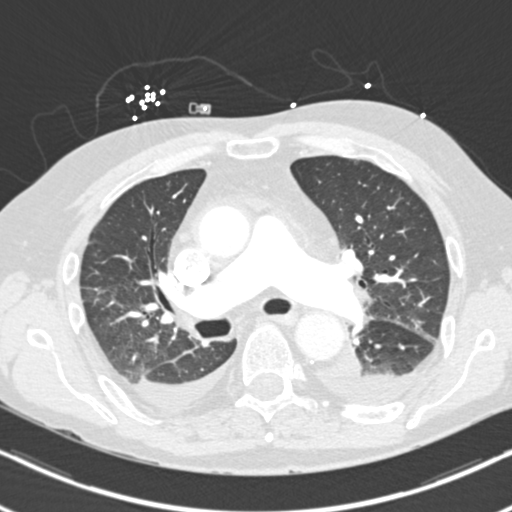
[im 159/290  mediastinal]
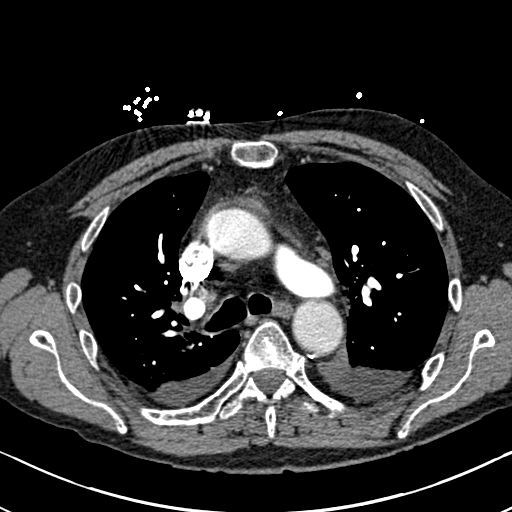
[im 174/290  lung]
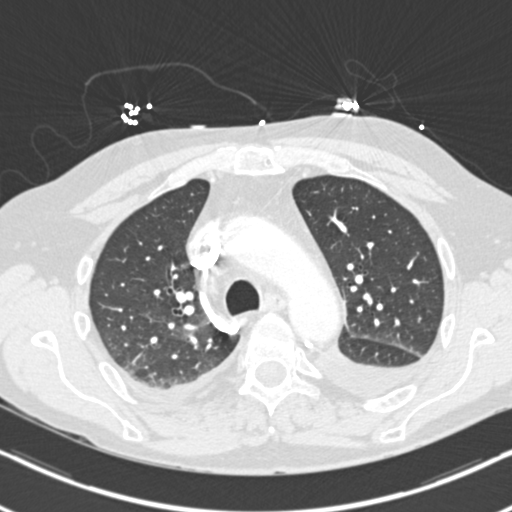
[im 188/290  mediastinal]
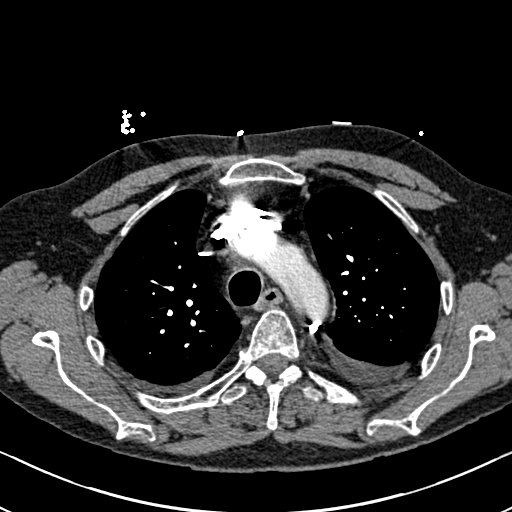
[im 203/290  lung]
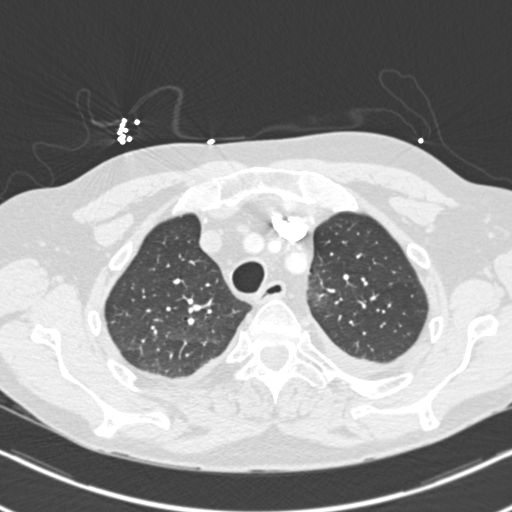
[im 232/290  mediastinal]
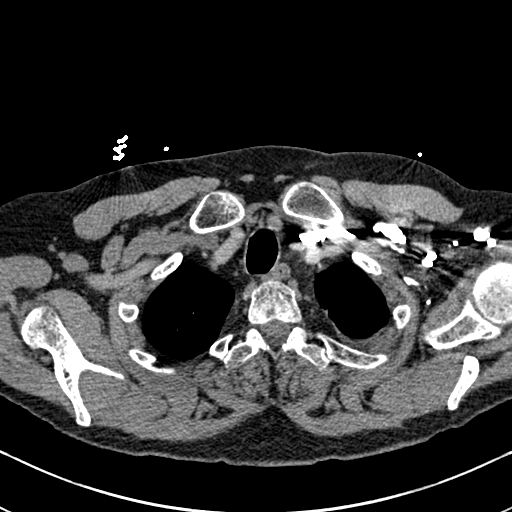
[im 246/290  lung]
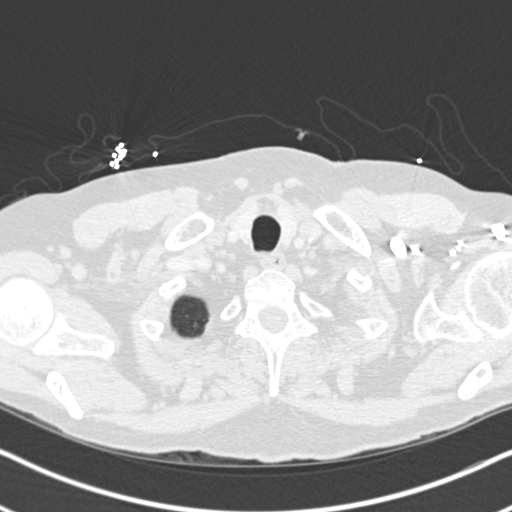
[im 261/290  mediastinal]
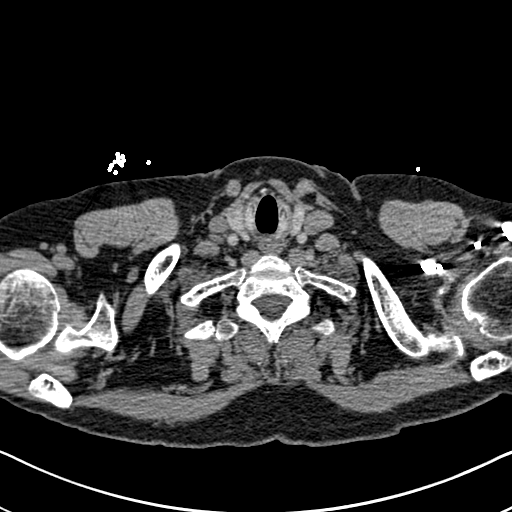
[im 275/290  lung]
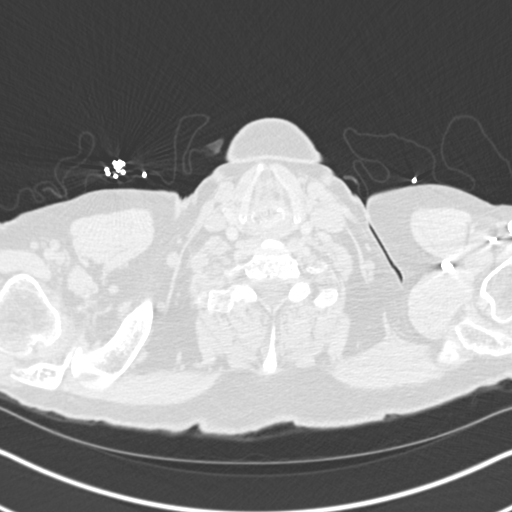

[Series 8: coronal mpr · coronal · 0.58mm/px · 1 of 127 slices shown]
[im 64/127  mediastinal]
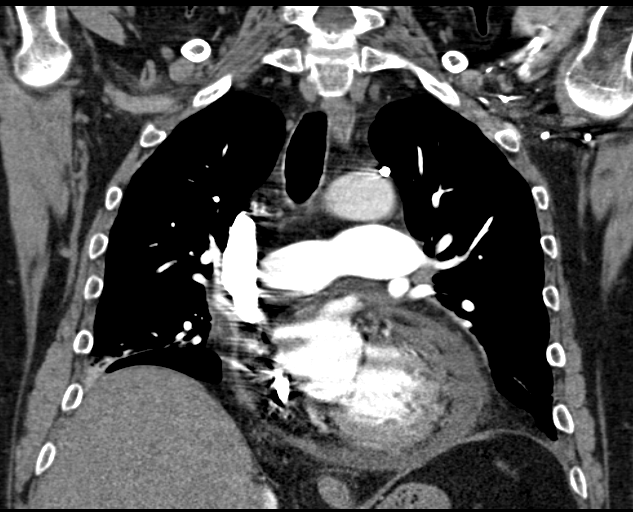

[18 of 36 positions shown; findings below may reference images not displayed]

FINDINGS: There is no pulmonary embolism identified within the main, lobar, or
central segmental pulmonary arteries. Some of the most peripheral
segmental and subsegmental pulmonary arteries are difficult to
definitively characterize due to mild patient breathing motion
artifact.

There is a moderate-sized pericardial effusion, measuring 2 cm
greatest thickness. Heart size is upper normal. No masses or
enlarged lymph nodes seen within the mediastinum or perihilar
regions. Thoracic aorta is normal in caliber. No aortic aneurysm or
dissection.

There are bilateral pleural effusions, small to moderate in size,
with adjacent compressive atelectasis. Additional mild atelectasis
noted within the right middle lobe and right upper lobe. Small
emphysematous blebs at each lung apex. Trachea and central bronchi
are unremarkable.

Limited images of the upper abdomen are also provided. There are
scattered small hypodense foci within the bilateral liver lobes,
incompletely imaged, which are too small to definitively
characterize but probably cysts. Mild degenerative changes noted
within the thoracic spine but no acute osseous abnormality.

Review of the MIP images confirms the above findings.
IMPRESSION: 1. Pericardial effusion, moderate in size, measuring 2 cm greatest
thickness.
2. No pulmonary embolism seen, with mild study limitations detailed
above.
3. Bilateral pleural effusions, small to moderate in size (left
slightly greater than right), with adjacent compressive atelectasis.
4. Scattered tiny hypodense foci within the bilateral liver lobes,
incompletely imaged, too small to characterize but probably benign
cysts.
5. Mild biapical emphysematous change.
These results were called by telephone at the time of interpretation
on 03/25/2015 at [DATE] to Dr. ES TIGER , who verbally
acknowledged these results.

## 2017-01-12 ENCOUNTER — Other Ambulatory Visit (HOSPITAL_COMMUNITY): Payer: Self-pay | Admitting: Student

## 2017-01-22 ENCOUNTER — Other Ambulatory Visit (HOSPITAL_COMMUNITY): Payer: Self-pay | Admitting: *Deleted

## 2017-01-22 MED ORDER — SACUBITRIL-VALSARTAN 97-103 MG PO TABS: 1.0000 | ORAL_TABLET | Freq: Two times a day (BID) | ORAL | 6 refills | Status: DC

## 2017-01-27 ENCOUNTER — Other Ambulatory Visit (HOSPITAL_COMMUNITY): Payer: Self-pay | Admitting: Internal Medicine

## 2017-01-29 ENCOUNTER — Other Ambulatory Visit (HOSPITAL_COMMUNITY): Payer: Self-pay | Admitting: Internal Medicine

## 2017-02-03 ENCOUNTER — Other Ambulatory Visit (HOSPITAL_COMMUNITY): Payer: Self-pay | Admitting: Internal Medicine

## 2017-02-15 ENCOUNTER — Ambulatory Visit (HOSPITAL_COMMUNITY)
Admission: RE | Admit: 2017-02-15 | Discharge: 2017-02-15 | Disposition: A | Discharge status: Home / Self Care | Payer: BLUE CROSS/BLUE SHIELD | Source: Ambulatory Visit | Attending: Internal Medicine | Admitting: Internal Medicine

## 2017-02-15 ENCOUNTER — Encounter (HOSPITAL_COMMUNITY): Payer: Self-pay | Admitting: Internal Medicine

## 2017-02-15 VITALS — BP 117/77 | HR 80 | Wt 220.5 lb

## 2017-02-15 DIAGNOSIS — I3139 Other pericardial effusion (noninflammatory): Secondary | ICD-10-CM

## 2017-02-15 DIAGNOSIS — I480 Paroxysmal atrial fibrillation: Secondary | ICD-10-CM | POA: Diagnosis not present

## 2017-02-15 DIAGNOSIS — E1169 Type 2 diabetes mellitus with other specified complication: Secondary | ICD-10-CM | POA: Insufficient documentation

## 2017-02-15 DIAGNOSIS — I4892 Unspecified atrial flutter: Secondary | ICD-10-CM | POA: Insufficient documentation

## 2017-02-15 DIAGNOSIS — Z7952 Long term (current) use of systemic steroids: Secondary | ICD-10-CM | POA: Diagnosis not present

## 2017-02-15 DIAGNOSIS — Z87891 Personal history of nicotine dependence: Secondary | ICD-10-CM | POA: Insufficient documentation

## 2017-02-15 DIAGNOSIS — I4891 Unspecified atrial fibrillation: Secondary | ICD-10-CM | POA: Diagnosis not present

## 2017-02-15 DIAGNOSIS — I447 Left bundle-branch block, unspecified: Secondary | ICD-10-CM | POA: Insufficient documentation

## 2017-02-15 DIAGNOSIS — G4733 Obstructive sleep apnea (adult) (pediatric): Secondary | ICD-10-CM | POA: Insufficient documentation

## 2017-02-15 DIAGNOSIS — I110 Hypertensive heart disease with heart failure: Secondary | ICD-10-CM | POA: Insufficient documentation

## 2017-02-15 DIAGNOSIS — M069 Rheumatoid arthritis, unspecified: Secondary | ICD-10-CM

## 2017-02-15 DIAGNOSIS — I313 Pericardial effusion (noninflammatory): Secondary | ICD-10-CM | POA: Diagnosis not present

## 2017-02-15 DIAGNOSIS — I5022 Chronic systolic (congestive) heart failure: Secondary | ICD-10-CM | POA: Diagnosis not present

## 2017-02-15 NOTE — Progress Notes (Signed)
Patient ID: Ethan Alexander, male   DOB: 1950-11-23, 66 y.o.   MRN: [id number]   Advanced Heart Failure Clinic Note   Primary Care: Trilby Drummer Primary HF Cardiologist: Bensihmon  HPI: Ethan Alexander is a 66 y.o. with hx of recently diagnosed systolic HF (EF 51%), RA (on chronic prednisone - followed by Dr. Ouida Sills), DM2, LBBB admitted with CP and found    Was admitted on 03/26/15 with CP and found to have moderate pericardial effusion, with thickened RV free wall and EF 30%. CT of chest negative for PE. He was taken to cath lab no evidence of tamponade. Developed A fib RVR and was started on amio + cardizem drip. He was cardioverted and placed in oral amiodarone.   LHC showed normal coronaries. Periods of apnea noted.  In August 2016 he presented to clinic with AFL but spontaneously converted. Amio increased to 400 bid. Bedside echo 40-45%. Pericardial effusion essentially resolved. Wore 30-day monitor. Amio decreased to 200 bid.  Failed sleep study with AHI 76. Now on CPAP.  S/p pan-metatarsal resection on the left foot at [place] to clinic for follow up. Overall doing well. Hard to walk due to R ankle pain. Rides stationary bike 2-3/week no CP or dyspnea. No orthopnea. PND, edema. Tolerting mads well. No palpitations. Still seeing Dr. Amil Amen in Rheumatology; Prednisone a 10mg  daily. No lightheadedness or dizziness.  Unable to tolerate increase of carvedilol at last visit.   EKG: Sinus Brady 92, LAD, LBBB  Echo 08/03/15: EF 40-45% in setting of LBBB and septal dyssynchrony. RV normal. No pericardial effusion.  Strain - 17%  Echo 7/17 EF 45-50%  RHC 03/25/2015 RA 9  RV 33/6 (21)  PCWP 9  Thermo 5.8/2.5  Fick 4.2/1.8  LHC 03/28/2015--> normal cors   DC-CV 03/27/2015   03/29/2015 cMRI EF 36% mild hyperenhancement suggestive of myocarditis. Moderate pericardial effusion  Bedside echo 8/16: 40-45% pericardial effusion resolved  9/16: 30-day event monitor no  AFL  Labs: (10/16) K 4.1 Creatinine 1.2 Labs: (2/17) K 4.4, Creatinine 1.1    Past Medical History:  Diagnosis Date  . Arthritis   . Diabetes mellitus    diet controlled  . Foot drop, left   . Hypertension   . Osteomyelitis ([place]) 2010   left foot  . Osteomyelitis ([place]) 2008/2010   left  foot- metatarsal- per patient was MRSA  . Renal disorder   . Ruptured lumbar disc    L5-S1   x 2 disc    Current Outpatient Prescriptions  Medication Sig Dispense Refill  . Abatacept (ORENCIA) 125 MG/ML SOSY Inject 125 mg into the skin once a week.     . carvedilol (COREG) 6.25 MG tablet Take 1.5 tablets (9.375 mg total) by mouth 2 (two) times daily with a meal. (Patient taking differently: Take 6.25 mg by mouth 2 (two) times daily with a meal. ) 90 tablet 3  . cholecalciferol (VITAMIN D) 1000 units tablet Take 1,000 Units by mouth daily.    Marland Kitchen ELIQUIS 5 MG TABS tablet TAKE 1 TABLET (5 MG TOTAL) BY MOUTH 2 (TWO) TIMES DAILY. 60 tablet 0  . escitalopram (LEXAPRO) 10 MG tablet Take 10 mg by mouth daily.    . folic acid (FOLVITE) 1 MG tablet Take 1 mg by mouth daily.    . Methotrexate Sodium (METHOTREXATE, PF,) 50 MG/2ML injection Inject 0.8 mLs as directed once a week. Inject 0.8 mLs as directed once a week.    . predniSONE (DELTASONE) 10  MG tablet Take 1 tablet (10 mg total) by mouth daily with breakfast. 90 tablet 2  . sacubitril-valsartan (ENTRESTO) 97-103 MG Take 1 tablet by mouth 2 (two) times daily. 60 tablet 6  . traMADol (ULTRAM) 50 MG tablet Take 50 mg by mouth daily as needed. Take 1 50mg  tablet daily as needed     No current facility-administered medications for this encounter.     Allergies  Allergen Reactions  . Sulfa Drugs Cross Reactors Other (See Comments)    Flu like symptoms   . Tape     Tears his skin  . Sulfa Antibiotics Other (See Comments)      Social History   Social History  . Marital status: Married    Spouse name: N/A  . Number of children: N/A  . Years of  education: N/A   Occupational History  . Not on file.   Social History Main Topics  . Smoking status: Former Smoker    Packs/day: 1.00    Years: 25.00    Quit date: 06/05/2006  . Smokeless tobacco: Never Used  . Alcohol use Yes     Comment: socially  . Drug use: No  . Sexual activity: Not on file   Other Topics Concern  . Not on file   Social History Narrative  . No narrative on file      Family History  Problem Relation Age of Onset  . Heart disease Father     Vitals:   02/15/17 1126  BP: 117/77  Pulse: 80  SpO2: 98%  Weight: 220 lb 8 oz (100 kg)   Wt Readings from Last 3 Encounters:  02/15/17 220 lb 8 oz (100 kg)  02/06/16 210 lb 12 oz (95.6 kg)  11/29/15 220 lb (99.8 kg)    PHYSICAL EXAM: General:  Well appearing. No respiratory difficulty HEENT: normal Neck: supple. JVP 5-6 . Carotids 2+ bilat; no bruits. No thyromegaly or nodule noted Cor: PMI nondisplaced. RRR no murmur Lungs: clear Abdomen: soft NT/ND good BS Extremities: no cyanosis, clubbing, rash, edema + diffuse RA changes. + bilateral LE bracese Neuro: alert & oriented x 3, cranial nerves grossly intact. moves all 4 extremities w/o difficulty. Affect pleasant    ASSESSMENT & PLAN: 1. Chronic systolic HF  -EF ~45-36% 46/80/32 - 03/28/15 LHC normal cors.  - cMRI 7/16 EF 36% - Doing very well. Volume status looks good. NYHA I-II. Mainly limited by RA.  - Continue current regimen. Recheck echo at next visit (9 months) 2. Pericardial effusion, moderate  --no tamponade by RHC 7/16 --resolved by echo. Repeat echo next visit.  3. RA  4. DM2 with h/o osteomyelitis LLE  6. Chronic LBBB  7. Atrial fibrillation/flutter with RVR s/p DC-CV 7/24.  (see ECG from 7/24 and 8/3 for AFL) CHA2DS2VASC is 3. --30 day event monitor with no AF on amio in 9/16 --Remains in NSR  --Has seen Dr. Caryl Comes in 3/17 and amio stopped --No evidence recurrent AF. He wants to consider stopping AC. Discussed pros/cons  and risk of stroke. Offered repeat 30-day monitor but he says he wil stay on [facility] for now.  9. OSA - severe with AHI 76. Continue CPAP.    Glori Bickers, MD  11:57 AM

## 2017-02-15 NOTE — Patient Instructions (Signed)
Your physician has requested that you have an echocardiogram. Echocardiography is a painless test that uses sound waves to create images of your heart. It provides your doctor with information about the size and shape of your heart and how well your heart's chambers and valves are working. This procedure takes approximately one hour. There are no restrictions for this procedure.  We will contact you in 9 months to schedule your next appointment.  

## 2017-02-15 NOTE — Progress Notes (Signed)
Advanced Heart Failure Medication Review by a Pharmacist  Does the patient  feel that his/her medications are working for him/her?  yes  Has the patient been experiencing any side effects to the medications prescribed?  no  Does the patient measure his/her own blood pressure or blood glucose at home?  no   Does the patient have any problems obtaining medications due to transportation or finances?   no  Understanding of regimen: good Understanding of indications: good Potential of compliance: good Patient understands to avoid NSAIDs. Patient understands to avoid decongestants.  Issues to address at subsequent visits: none  Pharmacist comments: Ethan Alexander is a pleasant 66 y/o male who presents with his medication list. He endorses good adherence to his medications. Patient reports no medication-related questions or concerns at this time.   Phillis Knack PharmD Candidate  Time with patient: 10 minutes Preparation and documentation time: 10 minutes Total time: 20 minutes

## 2017-02-19 ENCOUNTER — Other Ambulatory Visit (HOSPITAL_COMMUNITY): Payer: Self-pay | Admitting: Internal Medicine

## 2017-03-05 ENCOUNTER — Other Ambulatory Visit (HOSPITAL_COMMUNITY): Payer: Self-pay | Admitting: Internal Medicine

## 2017-04-08 ENCOUNTER — Other Ambulatory Visit: Payer: Self-pay

## 2017-04-08 ENCOUNTER — Ambulatory Visit (HOSPITAL_COMMUNITY): Payer: BLUE CROSS/BLUE SHIELD | Attending: Internal Medicine

## 2017-04-08 DIAGNOSIS — I110 Hypertensive heart disease with heart failure: Secondary | ICD-10-CM | POA: Insufficient documentation

## 2017-04-08 DIAGNOSIS — I5022 Chronic systolic (congestive) heart failure: Secondary | ICD-10-CM

## 2017-04-08 DIAGNOSIS — I313 Pericardial effusion (noninflammatory): Secondary | ICD-10-CM | POA: Insufficient documentation

## 2017-04-08 DIAGNOSIS — I4892 Unspecified atrial flutter: Secondary | ICD-10-CM | POA: Diagnosis not present

## 2017-04-08 DIAGNOSIS — I071 Rheumatic tricuspid insufficiency: Secondary | ICD-10-CM | POA: Insufficient documentation

## 2017-04-08 DIAGNOSIS — R000 Tachycardia, unspecified: Secondary | ICD-10-CM | POA: Insufficient documentation

## 2017-04-08 DIAGNOSIS — D649 Anemia, unspecified: Secondary | ICD-10-CM | POA: Diagnosis not present

## 2017-04-08 DIAGNOSIS — Z87891 Personal history of nicotine dependence: Secondary | ICD-10-CM | POA: Insufficient documentation

## 2017-04-18 ENCOUNTER — Telehealth (HOSPITAL_COMMUNITY): Payer: Self-pay

## 2017-04-18 NOTE — Telephone Encounter (Signed)
ECHOCARDIOGRAM COMPLETE  Order: [id number]  Status:  Final result Visible to patient:  No (Not Released) Dx:  Chronic systolic heart failure ([place])  Notes recorded by Effie Berkshire, RN on 04/18/2017 at 10:11 AM EDT Patient aware of results and advised to continue current med regimen and will call closer to time for his 9 month f/u in 11/2017 ------  Notes recorded by Scarlette Calico, RN on 04/16/2017 at 11:50 AM EDT Left message to call back ------  Notes recorded by Jolaine Artist, MD on 04/08/2017 at 9:38 PM EDT EF improved

## 2017-04-20 ENCOUNTER — Emergency Department (HOSPITAL_COMMUNITY): Payer: BLUE CROSS/BLUE SHIELD

## 2017-04-20 ENCOUNTER — Encounter (HOSPITAL_COMMUNITY): Payer: Self-pay

## 2017-04-20 ENCOUNTER — Emergency Department (HOSPITAL_COMMUNITY)
Admission: EM | Admit: 2017-04-20 | Discharge: 2017-04-20 | Disposition: A | Discharge status: Home / Self Care | Payer: BLUE CROSS/BLUE SHIELD | Attending: Emergency Medicine | Admitting: Emergency Medicine

## 2017-04-20 DIAGNOSIS — N2889 Other specified disorders of kidney and ureter: Secondary | ICD-10-CM | POA: Diagnosis not present

## 2017-04-20 DIAGNOSIS — Z7901 Long term (current) use of anticoagulants: Secondary | ICD-10-CM | POA: Insufficient documentation

## 2017-04-20 DIAGNOSIS — I110 Hypertensive heart disease with heart failure: Secondary | ICD-10-CM | POA: Insufficient documentation

## 2017-04-20 DIAGNOSIS — B372 Candidiasis of skin and nail: Secondary | ICD-10-CM | POA: Diagnosis not present

## 2017-04-20 DIAGNOSIS — K429 Umbilical hernia without obstruction or gangrene: Secondary | ICD-10-CM | POA: Insufficient documentation

## 2017-04-20 DIAGNOSIS — Z87891 Personal history of nicotine dependence: Secondary | ICD-10-CM | POA: Diagnosis not present

## 2017-04-20 DIAGNOSIS — I5042 Chronic combined systolic (congestive) and diastolic (congestive) heart failure: Secondary | ICD-10-CM | POA: Diagnosis not present

## 2017-04-20 DIAGNOSIS — M793 Panniculitis, unspecified: Secondary | ICD-10-CM | POA: Diagnosis not present

## 2017-04-20 DIAGNOSIS — Z79899 Other long term (current) drug therapy: Secondary | ICD-10-CM | POA: Insufficient documentation

## 2017-04-20 DIAGNOSIS — E119 Type 2 diabetes mellitus without complications: Secondary | ICD-10-CM | POA: Insufficient documentation

## 2017-04-20 HISTORY — DX: Heart failure, unspecified: I50.9

## 2017-04-20 HISTORY — DX: Umbilical hernia without obstruction or gangrene: K42.9

## 2017-04-20 LAB — COMPREHENSIVE METABOLIC PANEL
ALBUMIN: 3.1 g/dL — AB (ref 3.5–5.0)
ALT: 26 U/L (ref 17–63)
AST: 45 U/L — AB (ref 15–41)
Alkaline Phosphatase: 53 U/L (ref 38–126)
Anion gap: 9 (ref 5–15)
BUN: 19 mg/dL (ref 6–20)
CHLORIDE: 107 mmol/L (ref 101–111)
CO2: 21 mmol/L — ABNORMAL LOW (ref 22–32)
Calcium: 8.3 mg/dL — ABNORMAL LOW (ref 8.9–10.3)
Creatinine, Ser: 1.08 mg/dL (ref 0.61–1.24)
GFR calc Af Amer: >60 mL/min (ref >60)
GFR calc non Af Amer: >60 mL/min (ref >60)
GLUCOSE: 77 mg/dL (ref 65–99)
POTASSIUM: 5 mmol/L (ref 3.5–5.1)
Sodium: 137 mmol/L (ref 135–145)
Total Bilirubin: 1.6 mg/dL — ABNORMAL HIGH (ref 0.3–1.2)
Total Protein: 6 g/dL — ABNORMAL LOW (ref 6.5–8.1)

## 2017-04-20 LAB — CBC WITH DIFFERENTIAL/PLATELET
BASOS ABS: 0 10*3/uL (ref 0.0–0.1)
BASOS PCT: 1 %
EOS PCT: 2 %
Eosinophils Absolute: 0.1 10*3/uL (ref 0.0–0.7)
HEMATOCRIT: 41.3 % (ref 39.0–52.0)
Hemoglobin: 13.3 g/dL (ref 13.0–17.0)
Lymphocytes Relative: 19 %
Lymphs Abs: 1 10*3/uL (ref 0.7–4.0)
MCH: 31.2 pg (ref 26.0–34.0)
MCHC: 32.2 g/dL (ref 30.0–36.0)
MCV: 96.9 fL (ref 78.0–100.0)
MONO ABS: 0.9 10*3/uL (ref 0.1–1.0)
Monocytes Relative: 16 %
NEUTROS ABS: 3.4 10*3/uL (ref 1.7–7.7)
Neutrophils Relative %: 62 %
PLATELETS: 245 10*3/uL (ref 150–400)
RBC: 4.26 MIL/uL (ref 4.22–5.81)
RDW: 14.8 % (ref 11.5–15.5)
WBC: 5.4 10*3/uL (ref 4.0–10.5)

## 2017-04-20 LAB — I-STAT CHEM 8, ED
BUN: 24 mg/dL — AB (ref 6–20)
CALCIUM ION: 1.09 mmol/L — AB (ref 1.15–1.40)
CHLORIDE: 106 mmol/L (ref 101–111)
Creatinine, Ser: 1 mg/dL (ref 0.61–1.24)
GLUCOSE: 78 mg/dL (ref 65–99)
HCT: 40 % (ref 39.0–52.0)
Hemoglobin: 13.6 g/dL (ref 13.0–17.0)
Potassium: 3.9 mmol/L (ref 3.5–5.1)
Sodium: 139 mmol/L (ref 135–145)
TCO2: 24 mmol/L (ref 0–100)

## 2017-04-20 MED ORDER — IOPAMIDOL (ISOVUE-300) INJECTION 61%
INTRAVENOUS | Status: AC
Start: 2017-04-20 — End: 2017-04-20
  Administered 2017-04-20: 100 mL
  Filled 2017-04-20: qty 100

## 2017-04-20 MED ORDER — CLINDAMYCIN HCL 150 MG PO CAPS
300.0000 mg | ORAL_CAPSULE | Freq: Three times a day (TID) | ORAL | 0 refills | Status: DC
Start: 2017-04-20 — End: 2018-07-10

## 2017-04-20 MED ORDER — KETOCONAZOLE 2 % EX CREA: 1.0000 "application " | TOPICAL_CREAM | Freq: Every day | CUTANEOUS | 0 refills | Status: DC

## 2017-04-20 NOTE — ED Provider Notes (Signed)
[facility] DEPT Provider Note   CSN: [id number] Arrival date & time: 04/20/17  1116     History   Chief Complaint Chief Complaint  Patient presents with  . Abdominal Pain    HPI Ethan Alexander is a 66 y.o. male who  has a past medical history of Arthritis; CHF (congestive heart failure) ([place]); Diabetes mellitus; Foot drop, left; Hypertension; Osteomyelitis ([place]) (2010); Osteomyelitis ([place]) (2008/2010); Renal disorder; Ruptured lumbar disc; and Umbilical hernia. He is also on Prednisone, orencia, and methotrexate for his RA. The patient presents with a cc umbilical hernia pain. Patient states that this morning he began having redness, tenderness and pain in his umbilicus. This is new. It is constant. Worse with movement or palpation. The patient has been wearing a lumbar brace and felt this may have been putting pressure on his abdomen. He denies nausea, vomiting, fevers, chills. He is making normal bowel movements without melena or hematochezia.  HPI  Past Medical History:  Diagnosis Date  . Arthritis   . CHF (congestive heart failure) ([place])   . Diabetes mellitus    diet controlled  . Foot drop, left   . Hypertension   . Osteomyelitis ([place]) 2010   left foot  . Osteomyelitis ([place]) 2008/2010   left  foot- metatarsal- per patient was MRSA  . Renal disorder   . Ruptured lumbar disc    L5-S1   x 2 disc  . Umbilical hernia     Patient Active Problem List   Diagnosis Date Noted  . Atrial flutter, paroxysmal ([place]) 08/03/2015  . HTN (hypertension) 08/03/2015  . Chronic systolic heart failure ([place]) 04/27/2015  . Chronic diastolic heart failure ([place]) 04/06/2015  . Snoring 04/06/2015  . Paroxysmal atrial fibrillation ([facility])   . Pericarditis 03/25/2015  . Rheumatoid arthritis ([place]) 03/25/2015  . SOB (shortness of breath) 03/25/2015  . Sinus tachycardia 03/25/2015  . Pericardial effusion 03/25/2015  . Acute combined systolic and diastolic heart failure ([place]) 03/25/2015  .  Postop Acute blood loss anemia 06/26/2012  . OA (osteoarthritis) of hip 06/11/2012    Past Surgical History:  Procedure Laterality Date  . ACHILLES TENDON SURGERY     left  . CARDIAC CATHETERIZATION N/A 03/25/2015   Procedure: Right Heart Cath;  Surgeon: Leonie Man, MD;  Location: [facility];  Service: Cardiovascular;  Laterality: N/A;  . CARDIAC CATHETERIZATION N/A 03/28/2015   Procedure: Left Heart Cath;  Surgeon: Jolaine Artist, MD;  Location: [facility];  Service: Cardiovascular;  Laterality: N/A;  . CARDIOVERSION N/A 03/27/2015   Procedure: CARDIOVERSION;  Surgeon: Jolaine Artist, MD;  Location: [facility];  Service: Cardiovascular;  Laterality: N/A;  . EYE SURGERY     cataract ectraction with IOL-  bilateral  . FOOT SURGERY  2008/2010   x 2  left foot/ osteomyelitis  . TOTAL HIP ARTHROPLASTY  06/11/2012   Procedure: TOTAL HIP ARTHROPLASTY;  Surgeon: Gearlean Alf, MD;  Location: [facility];  Service: Orthopedics;  Laterality: Left;       Home Medications    Prior to Admission medications   Medication Sig Start Date End Date Taking? Authorizing Provider  Abatacept (ORENCIA) 125 MG/ML SOSY Inject 125 mg into the skin once a week.    Yes [provider]  carvedilol (COREG) 6.25 MG tablet Take 1.5 tablets (9.375 mg total) by mouth 2 (two) times daily with a meal. Patient taking differently: Take 6.25 mg by mouth 2 (two) times daily with a meal.  02/14/16  Yes Bensimhon, Shaune Pascal, MD  ELIQUIS 5 MG TABS tablet TAKE 1 TABLET (5 MG TOTAL) BY MOUTH 2 (TWO) TIMES DAILY. 03/05/17  Yes Bensimhon, Shaune Pascal, MD  escitalopram (LEXAPRO) 10 MG tablet Take 10 mg by mouth daily.   Yes [provider]  folic acid (FOLVITE) 1 MG tablet Take 1 mg by mouth daily.   Yes [provider]  Methotrexate Sodium (METHOTREXATE, PF,) 50 MG/2ML injection Inject 0.8 mLs as directed once a week. Inject 0.8 mLs as directed once a week.   Yes [provider]    predniSONE (DELTASONE) 10 MG tablet Take 1 tablet (10 mg total) by mouth daily with breakfast. Patient taking differently: Take 10 mg by mouth every evening.  11/21/15  Yes Bensimhon, Shaune Pascal, MD  sacubitril-valsartan (ENTRESTO) 97-103 MG Take 1 tablet by mouth 2 (two) times daily. 01/22/17  Yes Larey Dresser, MD  traMADol (ULTRAM) 50 MG tablet Take 50 mg by mouth daily as needed. Take 1 50mg  tablet daily as needed 10/25/14  Yes [provider]  clindamycin (CLEOCIN) 150 MG capsule Take 2 capsules (300 mg total) by mouth 3 (three) times daily. May dispense as 150mg  capsules 04/20/17   Bellarae Lizer, PA-C  ketoconazole (NIZORAL) 2 % cream Apply 1 application topically daily. 04/20/17   Margarita Mail, PA-C    Family History Family History  Problem Relation Age of Onset  . Heart disease Father     Social History Social History  Substance Use Topics  . Smoking status: Former Smoker    Packs/day: 1.00    Years: 25.00    Quit date: 06/05/2006  . Smokeless tobacco: Never Used  . Alcohol use Yes     Comment: socially     Allergies   Sulfa drugs cross reactors; Tape; and Sulfa antibiotics   Review of Systems Review of Systems Ten systems reviewed and are negative for acute change, except as noted in the HPI.    Physical Exam Updated Vital Signs BP 119/75 (BP Location: Right Arm)   Pulse 61   Temp 97.7 F (36.5 C) (Oral)   Resp 12   Ht 6\' 6"  (1.981 m)   Wt 99.8 kg (220 lb)   SpO2 97%   BMI 25.42 kg/m   Physical Exam  Constitutional: He appears well-developed and well-nourished. No distress.  HENT:  Head: Normocephalic and atraumatic.  Eyes: Conjunctivae are normal. No scleral icterus.  Neck: Normal range of motion. Neck supple.  Cardiovascular: Normal rate, regular rhythm and normal heart sounds.   Pulmonary/Chest: Effort normal and breath sounds normal. No respiratory distress.  Abdominal: Soft. Bowel sounds are normal. There is tenderness. A hernia is  present.    Musculoskeletal: He exhibits no edema.  Neurological: He is alert.  Skin: Skin is warm and dry. He is not diaphoretic.  Psychiatric: His behavior is normal.  Nursing note and vitals reviewed.    ED Treatments / Results  Labs (all labs ordered are listed, but only abnormal results are displayed) Labs Reviewed  COMPREHENSIVE METABOLIC PANEL - Abnormal; Notable for the following:       Result Value   CO2 21 (*)    Calcium 8.3 (*)    Total Protein 6.0 (*)    Albumin 3.1 (*)    AST 45 (*)    Total Bilirubin 1.6 (*)    All other components within normal limits  I-STAT CHEM 8, ED - Abnormal; Notable for the following:    BUN  24 (*)    Calcium, Ion 1.09 (*)    All other components within normal limits  CBC WITH DIFFERENTIAL/PLATELET    EKG  EKG Interpretation None       Radiology Ct Abdomen Pelvis W Contrast  Result Date: 04/20/2017 CLINICAL DATA:  Umbilical hernia with periumbilical tenderness EXAM: CT ABDOMEN AND PELVIS WITH CONTRAST TECHNIQUE: Multidetector CT imaging of the abdomen and pelvis was performed using the standard protocol following bolus administration of intravenous contrast. CONTRAST:  13mL ISOVUE-300 IOPAMIDOL (ISOVUE-300) INJECTION 61% COMPARISON:  November 04, 2008 FINDINGS: Lower chest: There is bibasilar lung atelectasis. There is slight lower lobe bronchiectatic change. Hepatobiliary: There are multiple subcentimeter cysts throughout the liver. Largest cyst is in the inferior posterior aspect of the right lobe of the liver measuring 1.4 x 1.2 cm. No noncystic appearing liver lesions are evident. Gallbladder wall is not appreciably thickened. There is no biliary duct dilatation. Pancreas: There is no pancreatic mass or inflammatory focus. Spleen: No splenic lesions are evident. Adrenals/Urinary Tract: Adrenals appear unremarkable. There is a mass arising from the medial aspect of the upper pole of the right kidney measuring 1.9 x 1.7 cm. This mass has  attenuation values higher than is expected with a cyst. There is a mass in the lateral mid left kidney measuring 1.6 x 1.2 cm which also has attenuation value higher than his a simple cyst. There is no hydronephrosis on either side. There is no renal or ureteral calculus on either side. Urinary bladder is midline with wall thickness within normal limits. Stomach/Bowel: There are multiple sigmoid diverticula without diverticulitis evident. There is no bowel wall or mesenteric thickening. No bowel obstruction. No free air or portal venous air. Vascular/Lymphatic: There is no abdominal aortic aneurysm. There is atherosclerotic calcification in the aorta and iliac arteries. Major mesenteric vessels appear patent. There is no adenopathy in the abdomen or pelvis. Reproductive: There are several prostatic calculi. Prostate and seminal vesicles appear normal in size and contour. No evident pelvic mass. Other: There is a ventral hernia which contains fat and slight panniculitis within the hernia. The neck of the hernia measures 2.4 cm from right to left dimension. The neck of the hernia measures 1.6 cm from superior to inferior dimension. There is no bowel extending into this hernia. Appendix appears normal. No abscess or ascites is evident in the abdomen or pelvis. Musculoskeletal: There is a total hip replacement on the left. There are no blastic or lytic bone lesions. There is degenerative change in the lumbar spine. There is vacuum phenomenon at L5-S1. There is broad-based disc protrusion at L4-5 and L5-S1. There is mild spinal stenosis at L4-5 due to disc protrusion and bony hypertrophy. There is no intramuscular lesion. No abdominal wall lesion evident beyond the ventral hernia. IMPRESSION: 1. Within the right kidney, there is a mass in the medial aspect upper pole region measuring 1.9 x 1.7 cm. There is also a similar appearing mass in the lateral mid left kidney measuring 1.6 x 1.2 cm. These masses have attenuation  values higher than is expected with simple cysts. Small renal cell neoplasms must be of concern given this appearance. Further assessment is advised. Further evaluation with pre and post contrast MRI or CT should be considered. MRI is preferred in younger patients (due to lack of ionizing radiation) and for evaluating calcified lesion(s). 2. Fairly small ventral hernia containing fat and mild panniculitis. No abnormal fluid collection or abscess in this area. No bowel extends into this ventral hernia.  3.  There is aortoiliac atherosclerosis. 4.  No bowel obstruction.  No abscess.  Appendix appears normal. 5. No renal or ureteral calculi. There are small prostatic calculi. No hydronephrosis. 6.  Fairly mild spinal stenosis L4-5, multifactorial. Aortic Atherosclerosis (ICD10-I70.0). These results will be called to the ordering clinician or representative by the Radiologist Assistant, and communication documented in the PACS or zVision Dashboard. Electronically Signed   By: Lowella Grip III M.D.   On: 04/20/2017 16:37    Procedures Procedures (including critical care time)  Medications Ordered in ED Medications  iopamidol (ISOVUE-300) 61 % injection (100 mLs  Contrast Given 04/20/17 1613)     Initial Impression / Assessment and Plan / ED Course  I have reviewed the triage vital signs and the nursing notes.  Pertinent labs & imaging results that were available during my care of the patient were reviewed by me and considered in my medical decision making (see chart for details).  Clinical Course as of Apr 20 1712  Sat Apr 20, 2017  1707 I have reviewed the patient's labs and imaging. I discussed the findings of incidental masses. Patient expresses understanding that he needs close follow-up for further evaluation of renal masses. Given his risk factors for development of cancer and immunocompromise. The patient was appears to have panniculitis and candidal infection of the umbilicus. He'll be  treated with ketoconazole 2% cream and clindamycin orally. I discussed return precautions.  [AH]    Clinical Course User Index [AH] Margarita Mail, PA-C      Final Clinical Impressions(s) / ED Diagnoses   Final diagnoses:  Acute panniculitis  Umbilical hernia without obstruction and without gangrene  Candidal skin infection  Bilateral renal masses    New Prescriptions New Prescriptions   CLINDAMYCIN (CLEOCIN) 150 MG CAPSULE    Take 2 capsules (300 mg total) by mouth 3 (three) times daily. May dispense as 150mg  capsules   KETOCONAZOLE (NIZORAL) 2 % CREAM    Apply 1 application topically daily.     Margarita Mail, PA-C 04/20/17 1714

## 2017-04-20 NOTE — ED Provider Notes (Signed)
Pt presents with swelling, redness, pain around his umbilicus.  Concerned because he is going out of town soon.  No nausea or vomiting.    On exam, hernia noted with surrounding erythema.  No fluctuance.  Doubt bowel incarceration, more likely infection vs omental tissue herniating and incarcerated.  Will ct to evaluate further.  Medical screening examination/treatment/procedure(s) were conducted as a shared visit with non-physician practitioner(s) and myself.  I personally evaluated the patient during the encounter.     Dorie Rank, MD 04/20/17 [id number]

## 2017-04-20 NOTE — ED Notes (Signed)
RN TO COLLECT LABS WITH IV START

## 2017-04-20 NOTE — ED Triage Notes (Signed)
Patient reports that he has an umbilical hernia that has been tender for a few days, but today it is red and is having increased pain. Patient denies any N/V.

## 2017-04-20 NOTE — Discharge Instructions (Signed)
Follow up closely with your PCP regarding the renal masses found incidentally on your CT scan for further evaluation. Take the medications I have prescribed as directed. Return for worsening symptoms such as worsening pain, spreading of the redness around your umbilicus, or fevers.

## 2017-07-02 ENCOUNTER — Other Ambulatory Visit (HOSPITAL_COMMUNITY): Payer: Self-pay | Admitting: Internal Medicine

## 2017-08-24 ENCOUNTER — Other Ambulatory Visit (HOSPITAL_COMMUNITY): Payer: Self-pay | Admitting: Cardiology

## 2017-11-03 ENCOUNTER — Other Ambulatory Visit (HOSPITAL_COMMUNITY): Payer: Self-pay | Admitting: Internal Medicine

## 2018-01-25 ENCOUNTER — Other Ambulatory Visit (HOSPITAL_COMMUNITY): Payer: Self-pay | Admitting: Internal Medicine

## 2018-02-26 ENCOUNTER — Other Ambulatory Visit (HOSPITAL_COMMUNITY): Payer: Self-pay | Admitting: Cardiology

## 2018-03-22 ENCOUNTER — Other Ambulatory Visit (HOSPITAL_COMMUNITY): Payer: Self-pay | Admitting: Internal Medicine

## 2018-03-28 DIAGNOSIS — E559 Vitamin D deficiency, unspecified: Secondary | ICD-10-CM | POA: Diagnosis not present

## 2018-03-28 DIAGNOSIS — E1122 Type 2 diabetes mellitus with diabetic chronic kidney disease: Secondary | ICD-10-CM | POA: Diagnosis not present

## 2018-03-28 DIAGNOSIS — E785 Hyperlipidemia, unspecified: Secondary | ICD-10-CM | POA: Diagnosis not present

## 2018-03-28 DIAGNOSIS — I129 Hypertensive chronic kidney disease with stage 1 through stage 4 chronic kidney disease, or unspecified chronic kidney disease: Secondary | ICD-10-CM | POA: Diagnosis not present

## 2018-03-28 DIAGNOSIS — I10 Essential (primary) hypertension: Secondary | ICD-10-CM | POA: Diagnosis not present

## 2018-03-31 ENCOUNTER — Other Ambulatory Visit (HOSPITAL_COMMUNITY): Payer: Self-pay

## 2018-03-31 MED ORDER — APIXABAN 5 MG PO TABS: 5.0000 mg | ORAL_TABLET | Freq: Two times a day (BID) | ORAL | 0 refills | Status: DC

## 2018-04-24 DIAGNOSIS — I5020 Unspecified systolic (congestive) heart failure: Secondary | ICD-10-CM | POA: Diagnosis not present

## 2018-04-24 DIAGNOSIS — Z6826 Body mass index (BMI) 26.0-26.9, adult: Secondary | ICD-10-CM | POA: Diagnosis not present

## 2018-04-24 DIAGNOSIS — E663 Overweight: Secondary | ICD-10-CM | POA: Diagnosis not present

## 2018-04-24 DIAGNOSIS — M810 Age-related osteoporosis without current pathological fracture: Secondary | ICD-10-CM | POA: Diagnosis not present

## 2018-04-24 DIAGNOSIS — M0589 Other rheumatoid arthritis with rheumatoid factor of multiple sites: Secondary | ICD-10-CM | POA: Diagnosis not present

## 2018-04-24 DIAGNOSIS — E7800 Pure hypercholesterolemia, unspecified: Secondary | ICD-10-CM | POA: Diagnosis not present

## 2018-04-24 DIAGNOSIS — M150 Primary generalized (osteo)arthritis: Secondary | ICD-10-CM | POA: Diagnosis not present

## 2018-04-25 ENCOUNTER — Other Ambulatory Visit (HOSPITAL_COMMUNITY): Payer: Self-pay | Admitting: Internal Medicine

## 2018-04-29 ENCOUNTER — Other Ambulatory Visit (HOSPITAL_COMMUNITY): Payer: Self-pay | Admitting: *Deleted

## 2018-04-29 ENCOUNTER — Other Ambulatory Visit (HOSPITAL_COMMUNITY): Payer: Self-pay

## 2018-04-29 MED ORDER — SACUBITRIL-VALSARTAN 97-103 MG PO TABS: 1.0000 | ORAL_TABLET | Freq: Two times a day (BID) | ORAL | 2 refills | Status: DC

## 2018-05-28 DIAGNOSIS — Z6825 Body mass index (BMI) 25.0-25.9, adult: Secondary | ICD-10-CM | POA: Diagnosis not present

## 2018-05-28 DIAGNOSIS — K429 Umbilical hernia without obstruction or gangrene: Secondary | ICD-10-CM | POA: Diagnosis not present

## 2018-06-25 DIAGNOSIS — Z23 Encounter for immunization: Secondary | ICD-10-CM | POA: Diagnosis not present

## 2018-07-08 DIAGNOSIS — K429 Umbilical hernia without obstruction or gangrene: Secondary | ICD-10-CM | POA: Diagnosis not present

## 2018-07-10 ENCOUNTER — Other Ambulatory Visit: Payer: Self-pay

## 2018-07-10 ENCOUNTER — Encounter (HOSPITAL_COMMUNITY): Payer: Self-pay | Admitting: Internal Medicine

## 2018-07-10 ENCOUNTER — Ambulatory Visit (HOSPITAL_COMMUNITY)
Admission: RE | Admit: 2018-07-10 | Discharge: 2018-07-10 | Disposition: A | Discharge status: Home / Self Care | Payer: Medicare Other | Source: Ambulatory Visit | Attending: Internal Medicine | Admitting: Internal Medicine

## 2018-07-10 VITALS — BP 97/67 | HR 80 | Wt 221.2 lb

## 2018-07-10 DIAGNOSIS — M25571 Pain in right ankle and joints of right foot: Secondary | ICD-10-CM | POA: Diagnosis not present

## 2018-07-10 DIAGNOSIS — Z87891 Personal history of nicotine dependence: Secondary | ICD-10-CM | POA: Insufficient documentation

## 2018-07-10 DIAGNOSIS — G4733 Obstructive sleep apnea (adult) (pediatric): Secondary | ICD-10-CM | POA: Diagnosis not present

## 2018-07-10 DIAGNOSIS — M069 Rheumatoid arthritis, unspecified: Secondary | ICD-10-CM | POA: Diagnosis not present

## 2018-07-10 DIAGNOSIS — Z7901 Long term (current) use of anticoagulants: Secondary | ICD-10-CM | POA: Insufficient documentation

## 2018-07-10 DIAGNOSIS — Z79899 Other long term (current) drug therapy: Secondary | ICD-10-CM | POA: Diagnosis not present

## 2018-07-10 DIAGNOSIS — I4892 Unspecified atrial flutter: Secondary | ICD-10-CM | POA: Insufficient documentation

## 2018-07-10 DIAGNOSIS — Z8249 Family history of ischemic heart disease and other diseases of the circulatory system: Secondary | ICD-10-CM | POA: Insufficient documentation

## 2018-07-10 DIAGNOSIS — I110 Hypertensive heart disease with heart failure: Secondary | ICD-10-CM | POA: Insufficient documentation

## 2018-07-10 DIAGNOSIS — Z888 Allergy status to other drugs, medicaments and biological substances status: Secondary | ICD-10-CM | POA: Diagnosis not present

## 2018-07-10 DIAGNOSIS — I313 Pericardial effusion (noninflammatory): Secondary | ICD-10-CM | POA: Diagnosis not present

## 2018-07-10 DIAGNOSIS — I480 Paroxysmal atrial fibrillation: Secondary | ICD-10-CM | POA: Diagnosis not present

## 2018-07-10 DIAGNOSIS — I5022 Chronic systolic (congestive) heart failure: Secondary | ICD-10-CM | POA: Diagnosis not present

## 2018-07-10 DIAGNOSIS — Z7952 Long term (current) use of systemic steroids: Secondary | ICD-10-CM | POA: Insufficient documentation

## 2018-07-10 DIAGNOSIS — Z882 Allergy status to sulfonamides status: Secondary | ICD-10-CM | POA: Insufficient documentation

## 2018-07-10 DIAGNOSIS — Z01810 Encounter for preprocedural cardiovascular examination: Secondary | ICD-10-CM | POA: Diagnosis not present

## 2018-07-10 DIAGNOSIS — E119 Type 2 diabetes mellitus without complications: Secondary | ICD-10-CM | POA: Insufficient documentation

## 2018-07-10 DIAGNOSIS — I3139 Other pericardial effusion (noninflammatory): Secondary | ICD-10-CM

## 2018-07-10 DIAGNOSIS — I447 Left bundle-branch block, unspecified: Secondary | ICD-10-CM | POA: Diagnosis not present

## 2018-07-10 MED ORDER — SACUBITRIL-VALSARTAN 49-51 MG PO TABS: 1.0000 | ORAL_TABLET | Freq: Two times a day (BID) | ORAL | 11 refills | Status: DC

## 2018-07-10 NOTE — Progress Notes (Signed)
Patient ID: Ethan Alexander, male   DOB: 07-Oct-1950, 67 y.o.   MRN: [id number]   Advanced Heart Failure Clinic Note   Primary Care: Trilby Drummer Primary HF Cardiologist: Bensimhon Rheumatology: Amil Amen  HPI: Ethan Alexander is a 67 y.o. with hx of recently diagnosed systolic HF (EF 62%), RA (on chronic prednisone - followed by Dr. Ouida Sills), DM2, LBBB admitted with CP and found    Was admitted on 03/26/15 with CP and found to have moderate pericardial effusion, with thickened RV free wall and EF 30%. CT of chest negative for PE. He was taken to cath lab no evidence of tamponade. Normal coronaries.  Developed A fib RVR and was started on amio + cardizem drip. He was cardioverted and placed in oral amiodarone.     In August 2016 he presented to clinic with AFL but spontaneously converted. Amio increased to 400 bid. Bedside echo 40-45%. Pericardial effusion essentially resolved. Wore 30-day monitor. Amio decreased to 200 bid.  Failed sleep study with AHI 76. Now on CPAP.  S/p pan-metatarsal resection on the left foot at [facility] Had partial R kidney resection in 1/19 and was benign   Returns to clinic for follow up. Overall doing well. Hard to walk due to R ankle pain but pain better as it is about self fuse. Rides stationary bike every other day. No CP or SOB or edema. No palpitations. Carvedilol cut back due to low BP. Still gets dizzy when he leans over and occasionaly when he stands. SBP typically in 90s. Pending hernia surgery.   03/29/2015 cMRI EF 36% mild hyperenhancement suggestive of myocarditis. Moderate pericardial effusion  Bedside echo 8/16: 40-45% pericardial effusion resolved Echo 08/03/15: EF 40-45% in setting of LBBB and septal dyssynchrony. RV normal. No pericardial effusion.  Strain - 17%  Echo 7/17 EF 45-50%  Echo 8/18 EF 50-55% no effusion.   [place] 03/25/2015 RA 9  RV 33/6 (21)  PCWP 9  Thermo 5.8/2.5  Fick 4.2/1.8  LHC 03/28/2015--> normal cors   DC-CV 03/27/2015       9/16: 30-day event monitor no AFL  Labs: (10/16) K 4.1 Creatinine 1.2 Labs: (2/17) K 4.4, Creatinine 1.1    Past Medical History:  Diagnosis Date  . Arthritis   . CHF (congestive heart failure) ([place])   . Diabetes mellitus    diet controlled  . Foot drop, left   . Hypertension   . Osteomyelitis ([place]) 2010   left foot  . Osteomyelitis ([place]) 2008/2010   left  foot- metatarsal- per patient was MRSA  . Renal disorder   . Ruptured lumbar disc    L5-S1   x 2 disc  . Umbilical hernia     Current Outpatient Medications  Medication Sig Dispense Refill  . Abatacept (ORENCIA) 125 MG/ML SOSY Inject 125 mg into the skin once a week.     . carvedilol (COREG) 6.25 MG tablet Take 6.25 mg by mouth 2 (two) times daily with a meal.    . ELIQUIS 5 MG TABS tablet TAKE 1 TABLET BY MOUTH TWICE A DAY 60 tablet 3  . escitalopram (LEXAPRO) 10 MG tablet Take 10 mg by mouth daily.    . folic acid (FOLVITE) 1 MG tablet Take 1 mg by mouth daily.    . predniSONE (DELTASONE) 10 MG tablet Take 1 tablet (10 mg total) by mouth daily with breakfast. 90 tablet 2  . sacubitril-valsartan (ENTRESTO) 97-103 MG Take 1 tablet by mouth 2 (two) times daily. 60 tablet 2  .  traMADol (ULTRAM) 50 MG tablet Take 50 mg by mouth daily as needed. Take 1 50mg  tablet daily as needed     No current facility-administered medications for this encounter.     Allergies  Allergen Reactions  . Sulfa Drugs Cross Reactors Other (See Comments)    Flu like symptoms   . Tape     Tears his skin  . Sulfa Antibiotics Other (See Comments)      Social History   Socioeconomic History  . Marital status: Married    Spouse name: Not on file  . Number of children: Not on file  . Years of education: Not on file  . Highest education level: Not on file  Occupational History  . Not on file  Social Needs  . Financial resource strain: Not on file  . Food insecurity:    Worry: Not on file    Inability: Not on file  .  Transportation needs:    Medical: Not on file    Non-medical: Not on file  Tobacco Use  . Smoking status: Former Smoker    Packs/day: 1.00    Years: 25.00    Pack years: 25.00    Last attempt to quit: 06/05/2006    Years since quitting: 12.1  . Smokeless tobacco: Never Used  Substance and Sexual Activity  . Alcohol use: Yes    Comment: socially  . Drug use: No  . Sexual activity: Not on file  Lifestyle  . Physical activity:    Days per week: Not on file    Minutes per session: Not on file  . Stress: Not on file  Relationships  . Social connections:    Talks on phone: Not on file    Gets together: Not on file    Attends religious service: Not on file    Active member of club or organization: Not on file    Attends meetings of clubs or organizations: Not on file    Relationship status: Not on file  . Intimate partner violence:    Fear of current or ex partner: Not on file    Emotionally abused: Not on file    Physically abused: Not on file    Forced sexual activity: Not on file  Other Topics Concern  . Not on file  Social History Narrative  . Not on file      Family History  Problem Relation Age of Onset  . Heart disease Father     Vitals:   07/10/18 1451  BP: 97/67  Pulse: 80  SpO2: 98%  Weight: 100.3 kg (221 lb 3.2 oz)   Wt Readings from Last 3 Encounters:  07/10/18 100.3 kg (221 lb 3.2 oz)  04/20/17 99.8 kg (220 lb)  02/15/17 100 kg (220 lb 8 oz)    PHYSICAL EXAM: General:  Well appearing. No resp difficulty HEENT: normal Neck: supple. no JVD. Carotids 2+ bilat; no bruits. No lymphadenopathy or thryomegaly appreciated. Cor: PMI nondisplaced. Regular rate & rhythm. No rubs, gallops or murmurs. Lungs: clear Abdomen: soft, nontender, nondistended. No hepatosplenomegaly. No bruits or masses. Good bowel sounds. Extremities: no cyanosis, clubbing, rash, edema LLE brace. Diffuse RA changes Neuro: alert & orientedx3, cranial nerves grossly intact. moves all  4 extremities w/o difficulty. Affect pleasant  ECG: NSR 80 LBBB 185ms Personally reviewed   ASSESSMENT & PLAN: 1. Chronic systolic HF  - cMRI 5/46 EF 36% - EF ~45-50% 08/03/15 - 03/28/15 LHC normal cors.  - Echo 8/18 EF 50-55% no effusion. -  Doing very well. Volume status looks good. NYHA I-II. Mainly limited by RA.  - BP very low. Decrease Entresto to 49/51 2. Pericardial effusion, moderate  --no tamponade by RHC 7/16 --resolved by echo. 3. RA  4. DM2 with h/o osteomyelitis LLE  5. Chronic LBBB  7. Paroxysmal Atrial fibrillation/flutter with RVR s/p DC-CV 7/24.  (see ECG from 7/24 and 8/3 for AFL) CHA2DS2VASC is 3. --30 day event monitor with no AF on amio in 9/16 --Remains in NSR  --Has seen Dr. Caryl Comes in 3/17 and amio stopped --No evidence recurrent AF. He wants to consider stopping AC. Previously sdiscussed pros/cons and risk of stroke. Offered repeat 30-day monitor but he says he wil stay on [facility] for now.  9. OSA - severe with AHI 76. Continue CPAP.  10. Pre-operative surgical evaluation  - he is low-risk for CV complications. Ok to proceed.   Glori Bickers, MD  2:54 PM

## 2018-07-10 NOTE — Patient Instructions (Signed)
CHANGE Entresto 49/51 (1 tab) Twice daily

## 2018-07-17 DIAGNOSIS — Z79899 Other long term (current) drug therapy: Secondary | ICD-10-CM | POA: Diagnosis not present

## 2018-07-17 DIAGNOSIS — K429 Umbilical hernia without obstruction or gangrene: Secondary | ICD-10-CM | POA: Diagnosis not present

## 2018-07-17 DIAGNOSIS — Z01810 Encounter for preprocedural cardiovascular examination: Secondary | ICD-10-CM | POA: Diagnosis not present

## 2018-07-17 DIAGNOSIS — I110 Hypertensive heart disease with heart failure: Secondary | ICD-10-CM | POA: Diagnosis not present

## 2018-07-17 DIAGNOSIS — I480 Paroxysmal atrial fibrillation: Secondary | ICD-10-CM | POA: Diagnosis not present

## 2018-07-17 DIAGNOSIS — E119 Type 2 diabetes mellitus without complications: Secondary | ICD-10-CM | POA: Diagnosis not present

## 2018-07-17 DIAGNOSIS — K432 Incisional hernia without obstruction or gangrene: Secondary | ICD-10-CM | POA: Diagnosis not present

## 2018-07-17 DIAGNOSIS — Z01818 Encounter for other preprocedural examination: Secondary | ICD-10-CM | POA: Diagnosis not present

## 2018-07-17 DIAGNOSIS — I5022 Chronic systolic (congestive) heart failure: Secondary | ICD-10-CM | POA: Diagnosis not present

## 2018-07-17 DIAGNOSIS — Z7901 Long term (current) use of anticoagulants: Secondary | ICD-10-CM | POA: Diagnosis not present

## 2018-07-17 DIAGNOSIS — Z7952 Long term (current) use of systemic steroids: Secondary | ICD-10-CM | POA: Diagnosis not present

## 2018-07-17 DIAGNOSIS — Z86718 Personal history of other venous thrombosis and embolism: Secondary | ICD-10-CM | POA: Diagnosis not present

## 2018-07-17 DIAGNOSIS — I447 Left bundle-branch block, unspecified: Secondary | ICD-10-CM | POA: Diagnosis not present

## 2018-07-17 DIAGNOSIS — Z87891 Personal history of nicotine dependence: Secondary | ICD-10-CM | POA: Diagnosis not present

## 2018-07-17 DIAGNOSIS — M069 Rheumatoid arthritis, unspecified: Secondary | ICD-10-CM | POA: Diagnosis not present

## 2018-07-17 DIAGNOSIS — R9431 Abnormal electrocardiogram [ECG] [EKG]: Secondary | ICD-10-CM | POA: Diagnosis not present

## 2018-07-17 DIAGNOSIS — G4733 Obstructive sleep apnea (adult) (pediatric): Secondary | ICD-10-CM | POA: Diagnosis not present

## 2018-07-25 DIAGNOSIS — Z09 Encounter for follow-up examination after completed treatment for conditions other than malignant neoplasm: Secondary | ICD-10-CM | POA: Diagnosis not present

## 2018-07-25 DIAGNOSIS — Z6825 Body mass index (BMI) 25.0-25.9, adult: Secondary | ICD-10-CM | POA: Diagnosis not present

## 2018-08-26 ENCOUNTER — Other Ambulatory Visit (HOSPITAL_COMMUNITY): Payer: Self-pay | Admitting: Internal Medicine

## 2018-09-01 ENCOUNTER — Other Ambulatory Visit (HOSPITAL_COMMUNITY): Payer: Self-pay

## 2018-09-01 MED ORDER — APIXABAN 5 MG PO TABS: 5.0000 mg | ORAL_TABLET | Freq: Two times a day (BID) | ORAL | 3 refills | Status: DC

## 2018-09-11 DIAGNOSIS — M150 Primary generalized (osteo)arthritis: Secondary | ICD-10-CM | POA: Diagnosis not present

## 2018-09-11 DIAGNOSIS — I5020 Unspecified systolic (congestive) heart failure: Secondary | ICD-10-CM | POA: Diagnosis not present

## 2018-09-11 DIAGNOSIS — M0589 Other rheumatoid arthritis with rheumatoid factor of multiple sites: Secondary | ICD-10-CM | POA: Diagnosis not present

## 2018-09-11 DIAGNOSIS — E663 Overweight: Secondary | ICD-10-CM | POA: Diagnosis not present

## 2018-09-11 DIAGNOSIS — Z6825 Body mass index (BMI) 25.0-25.9, adult: Secondary | ICD-10-CM | POA: Diagnosis not present

## 2018-09-11 DIAGNOSIS — M810 Age-related osteoporosis without current pathological fracture: Secondary | ICD-10-CM | POA: Diagnosis not present

## 2018-09-11 DIAGNOSIS — E7800 Pure hypercholesterolemia, unspecified: Secondary | ICD-10-CM | POA: Diagnosis not present

## 2018-09-16 DIAGNOSIS — E1122 Type 2 diabetes mellitus with diabetic chronic kidney disease: Secondary | ICD-10-CM | POA: Diagnosis not present

## 2018-09-16 DIAGNOSIS — I129 Hypertensive chronic kidney disease with stage 1 through stage 4 chronic kidney disease, or unspecified chronic kidney disease: Secondary | ICD-10-CM | POA: Diagnosis not present

## 2018-09-16 DIAGNOSIS — M79669 Pain in unspecified lower leg: Secondary | ICD-10-CM | POA: Diagnosis not present

## 2018-10-08 DIAGNOSIS — J019 Acute sinusitis, unspecified: Secondary | ICD-10-CM | POA: Diagnosis not present

## 2018-12-24 ENCOUNTER — Other Ambulatory Visit (HOSPITAL_COMMUNITY): Payer: Self-pay | Admitting: Internal Medicine

## 2019-01-08 DIAGNOSIS — M150 Primary generalized (osteo)arthritis: Secondary | ICD-10-CM | POA: Diagnosis not present

## 2019-01-08 DIAGNOSIS — M810 Age-related osteoporosis without current pathological fracture: Secondary | ICD-10-CM | POA: Diagnosis not present

## 2019-01-08 DIAGNOSIS — E7800 Pure hypercholesterolemia, unspecified: Secondary | ICD-10-CM | POA: Diagnosis not present

## 2019-01-08 DIAGNOSIS — I5020 Unspecified systolic (congestive) heart failure: Secondary | ICD-10-CM | POA: Diagnosis not present

## 2019-01-08 DIAGNOSIS — M0589 Other rheumatoid arthritis with rheumatoid factor of multiple sites: Secondary | ICD-10-CM | POA: Diagnosis not present

## 2019-03-10 DIAGNOSIS — I5022 Chronic systolic (congestive) heart failure: Secondary | ICD-10-CM | POA: Diagnosis not present

## 2019-03-10 DIAGNOSIS — E1169 Type 2 diabetes mellitus with other specified complication: Secondary | ICD-10-CM | POA: Diagnosis not present

## 2019-03-10 DIAGNOSIS — M0579 Rheumatoid arthritis with rheumatoid factor of multiple sites without organ or systems involvement: Secondary | ICD-10-CM | POA: Diagnosis not present

## 2019-03-10 DIAGNOSIS — Z6825 Body mass index (BMI) 25.0-25.9, adult: Secondary | ICD-10-CM | POA: Diagnosis not present

## 2019-03-10 DIAGNOSIS — E785 Hyperlipidemia, unspecified: Secondary | ICD-10-CM | POA: Diagnosis not present

## 2019-03-31 ENCOUNTER — Other Ambulatory Visit (HOSPITAL_COMMUNITY): Payer: Self-pay | Admitting: *Deleted

## 2019-03-31 MED ORDER — CARVEDILOL 6.25 MG PO TABS: 6.2500 mg | ORAL_TABLET | Freq: Two times a day (BID) | ORAL | 3 refills | Status: DC

## 2019-04-16 DIAGNOSIS — M810 Age-related osteoporosis without current pathological fracture: Secondary | ICD-10-CM | POA: Diagnosis not present

## 2019-04-26 ENCOUNTER — Other Ambulatory Visit (HOSPITAL_COMMUNITY): Payer: Self-pay | Admitting: Internal Medicine

## 2019-06-13 DIAGNOSIS — G4733 Obstructive sleep apnea (adult) (pediatric): Secondary | ICD-10-CM | POA: Diagnosis present

## 2019-06-13 DIAGNOSIS — E785 Hyperlipidemia, unspecified: Secondary | ICD-10-CM | POA: Diagnosis not present

## 2019-06-13 DIAGNOSIS — I5022 Chronic systolic (congestive) heart failure: Secondary | ICD-10-CM | POA: Diagnosis not present

## 2019-06-13 DIAGNOSIS — I10 Essential (primary) hypertension: Secondary | ICD-10-CM | POA: Diagnosis not present

## 2019-06-13 DIAGNOSIS — R1084 Generalized abdominal pain: Secondary | ICD-10-CM | POA: Diagnosis not present

## 2019-06-13 DIAGNOSIS — I482 Chronic atrial fibrillation, unspecified: Secondary | ICD-10-CM | POA: Diagnosis not present

## 2019-06-13 DIAGNOSIS — Z96642 Presence of left artificial hip joint: Secondary | ICD-10-CM | POA: Diagnosis present

## 2019-06-13 DIAGNOSIS — S0990XA Unspecified injury of head, initial encounter: Secondary | ICD-10-CM | POA: Diagnosis not present

## 2019-06-13 DIAGNOSIS — S3993XA Unspecified injury of pelvis, initial encounter: Secondary | ICD-10-CM | POA: Diagnosis not present

## 2019-06-13 DIAGNOSIS — S3210XA Unspecified fracture of sacrum, initial encounter for closed fracture: Secondary | ICD-10-CM | POA: Diagnosis not present

## 2019-06-13 DIAGNOSIS — E875 Hyperkalemia: Secondary | ICD-10-CM | POA: Diagnosis present

## 2019-06-13 DIAGNOSIS — W109XXA Fall (on) (from) unspecified stairs and steps, initial encounter: Secondary | ICD-10-CM | POA: Diagnosis not present

## 2019-06-13 DIAGNOSIS — S32119A Unspecified Zone I fracture of sacrum, initial encounter for closed fracture: Secondary | ICD-10-CM | POA: Diagnosis not present

## 2019-06-13 DIAGNOSIS — Z91041 Radiographic dye allergy status: Secondary | ICD-10-CM | POA: Diagnosis not present

## 2019-06-13 DIAGNOSIS — M25552 Pain in left hip: Secondary | ICD-10-CM | POA: Diagnosis not present

## 2019-06-13 DIAGNOSIS — Z882 Allergy status to sulfonamides status: Secondary | ICD-10-CM | POA: Diagnosis not present

## 2019-06-13 DIAGNOSIS — E860 Dehydration: Secondary | ICD-10-CM | POA: Diagnosis present

## 2019-06-13 DIAGNOSIS — M21372 Foot drop, left foot: Secondary | ICD-10-CM | POA: Diagnosis present

## 2019-06-13 DIAGNOSIS — I447 Left bundle-branch block, unspecified: Secondary | ICD-10-CM | POA: Diagnosis not present

## 2019-06-13 DIAGNOSIS — Z87891 Personal history of nicotine dependence: Secondary | ICD-10-CM | POA: Diagnosis not present

## 2019-06-13 DIAGNOSIS — I110 Hypertensive heart disease with heart failure: Secondary | ICD-10-CM | POA: Diagnosis present

## 2019-06-13 DIAGNOSIS — S199XXA Unspecified injury of neck, initial encounter: Secondary | ICD-10-CM | POA: Diagnosis not present

## 2019-06-13 DIAGNOSIS — I2510 Atherosclerotic heart disease of native coronary artery without angina pectoris: Secondary | ICD-10-CM | POA: Diagnosis present

## 2019-06-13 DIAGNOSIS — U071 COVID-19: Secondary | ICD-10-CM | POA: Diagnosis not present

## 2019-06-13 DIAGNOSIS — W1830XA Fall on same level, unspecified, initial encounter: Secondary | ICD-10-CM | POA: Diagnosis not present

## 2019-06-13 DIAGNOSIS — Z7901 Long term (current) use of anticoagulants: Secondary | ICD-10-CM | POA: Diagnosis not present

## 2019-06-13 DIAGNOSIS — E119 Type 2 diabetes mellitus without complications: Secondary | ICD-10-CM | POA: Diagnosis not present

## 2019-06-13 DIAGNOSIS — I480 Paroxysmal atrial fibrillation: Secondary | ICD-10-CM | POA: Diagnosis present

## 2019-06-13 DIAGNOSIS — S299XXA Unspecified injury of thorax, initial encounter: Secondary | ICD-10-CM | POA: Diagnosis not present

## 2019-06-13 DIAGNOSIS — M069 Rheumatoid arthritis, unspecified: Secondary | ICD-10-CM | POA: Diagnosis not present

## 2019-06-13 DIAGNOSIS — S32592A Other specified fracture of left pubis, initial encounter for closed fracture: Secondary | ICD-10-CM | POA: Diagnosis not present

## 2019-06-13 DIAGNOSIS — N179 Acute kidney failure, unspecified: Secondary | ICD-10-CM | POA: Diagnosis not present

## 2019-06-13 DIAGNOSIS — S0101XA Laceration without foreign body of scalp, initial encounter: Secondary | ICD-10-CM | POA: Diagnosis not present

## 2019-06-23 ENCOUNTER — Other Ambulatory Visit (HOSPITAL_COMMUNITY): Payer: Self-pay | Admitting: Internal Medicine

## 2019-06-26 DIAGNOSIS — U071 COVID-19: Secondary | ICD-10-CM | POA: Diagnosis not present

## 2019-06-26 DIAGNOSIS — S0101XD Laceration without foreign body of scalp, subsequent encounter: Secondary | ICD-10-CM | POA: Diagnosis not present

## 2019-06-26 DIAGNOSIS — S32502D Unspecified fracture of left pubis, subsequent encounter for fracture with routine healing: Secondary | ICD-10-CM | POA: Diagnosis not present

## 2019-06-26 DIAGNOSIS — I5022 Chronic systolic (congestive) heart failure: Secondary | ICD-10-CM | POA: Diagnosis not present

## 2019-06-26 DIAGNOSIS — Z7901 Long term (current) use of anticoagulants: Secondary | ICD-10-CM | POA: Diagnosis not present

## 2019-06-26 DIAGNOSIS — I110 Hypertensive heart disease with heart failure: Secondary | ICD-10-CM | POA: Diagnosis not present

## 2019-06-26 DIAGNOSIS — K5900 Constipation, unspecified: Secondary | ICD-10-CM | POA: Diagnosis not present

## 2019-06-26 DIAGNOSIS — S3210XD Unspecified fracture of sacrum, subsequent encounter for fracture with routine healing: Secondary | ICD-10-CM | POA: Diagnosis not present

## 2019-06-26 DIAGNOSIS — Z7952 Long term (current) use of systemic steroids: Secondary | ICD-10-CM | POA: Diagnosis not present

## 2019-06-26 DIAGNOSIS — E785 Hyperlipidemia, unspecified: Secondary | ICD-10-CM | POA: Diagnosis not present

## 2019-06-26 DIAGNOSIS — I2510 Atherosclerotic heart disease of native coronary artery without angina pectoris: Secondary | ICD-10-CM | POA: Diagnosis not present

## 2019-06-26 DIAGNOSIS — I482 Chronic atrial fibrillation, unspecified: Secondary | ICD-10-CM | POA: Diagnosis not present

## 2019-06-26 DIAGNOSIS — W1830XD Fall on same level, unspecified, subsequent encounter: Secondary | ICD-10-CM | POA: Diagnosis not present

## 2019-06-26 DIAGNOSIS — G4733 Obstructive sleep apnea (adult) (pediatric): Secondary | ICD-10-CM | POA: Diagnosis not present

## 2019-06-26 DIAGNOSIS — M069 Rheumatoid arthritis, unspecified: Secondary | ICD-10-CM | POA: Diagnosis not present

## 2019-06-26 DIAGNOSIS — F329 Major depressive disorder, single episode, unspecified: Secondary | ICD-10-CM | POA: Diagnosis not present

## 2019-06-26 DIAGNOSIS — E119 Type 2 diabetes mellitus without complications: Secondary | ICD-10-CM | POA: Diagnosis not present

## 2019-06-26 DIAGNOSIS — I447 Left bundle-branch block, unspecified: Secondary | ICD-10-CM | POA: Diagnosis not present

## 2019-06-30 ENCOUNTER — Telehealth (HOSPITAL_COMMUNITY): Payer: Self-pay | Admitting: *Deleted

## 2019-06-30 NOTE — Telephone Encounter (Signed)
Patients wife called requesting a virtual visit with Dr.Bensimhon. Patient experiencing low bp and dizziness when standing. Patient can not come to the office because he broke his pelvis. appt w/Dr.Bensimhon scheduled.

## 2019-07-02 ENCOUNTER — Other Ambulatory Visit: Payer: Self-pay

## 2019-07-02 ENCOUNTER — Ambulatory Visit (HOSPITAL_COMMUNITY)
Admission: RE | Admit: 2019-07-02 | Discharge: 2019-07-02 | Disposition: A | Discharge status: Home / Self Care | Payer: Medicare Other | Source: Ambulatory Visit | Attending: Internal Medicine | Admitting: Internal Medicine

## 2019-07-02 DIAGNOSIS — I313 Pericardial effusion (noninflammatory): Secondary | ICD-10-CM

## 2019-07-02 DIAGNOSIS — I5022 Chronic systolic (congestive) heart failure: Secondary | ICD-10-CM

## 2019-07-02 DIAGNOSIS — I3139 Other pericardial effusion (noninflammatory): Secondary | ICD-10-CM

## 2019-07-02 DIAGNOSIS — I951 Orthostatic hypotension: Secondary | ICD-10-CM

## 2019-07-02 NOTE — Progress Notes (Signed)
Heart Failure TeleHealth Note  Due to national recommendations of social distancing due to [place] 19, Audio/video telehealth visit is felt to be most appropriate for this patient at this time.  See MyChart message from today for patient consent regarding telehealth for [facility].  Date:  07/02/2019   ID:  Ethan Alexander, DOB 1951-04-24, MRN [id number]  Location: Home  Provider location: [facility] Type of Visit: Established patient  PCP:  Thressa Sheller, MD (Inactive)  Cardiologist:  No primary care provider on file. Primary HF: [place]  Chief Complaint: Heart Failure follow-up   History of Present Illness:  Ethan Alexander is a 68 y.o. male with hx of systolic HF (EF [id number]), RA (on chronic prednisone - followed by Dr. Ouida Sills), DM2, LBBB.   Was admitted on 03/26/15 with CP and found to have moderate pericardial effusion, with thickened RV free wall and EF 30%. CT of chest negative for PE. He was taken to cath lab no evidence of tamponade. Normal coronaries.  Developed A fib RVR and was started on amio + cardizem drip. He was cardioverted and placed in oral amiodarone.     In August 2016 he presented to clinic with AFL but spontaneously converted. Amio increased to 400 bid. Bedside echo 40-45%. Pericardial effusion essentially resolved. Wore 30-day monitor. Amio decreased to 200 bid.  Failed sleep study with AHI 76. Now on CPAP.  S/p pan-metatarsal resection on the left foot at [facility] Had partial R kidney resection in 1/19 and was benign   He presents via audio/video conferencing for a telehealth visit today.  We last saw him in clinic in 11/19. [place] and missed a step and fell and broke his pelvis. Now doing PT. Denies dizziness before falling says he just missed a step. At the hospital SBP was low in 70s. Stopped Entresto and SBP went to 150. Now back on Entresto 49/51. Not taking any lasix.  Yesterday got dizzy after taking shower and BP 92/61 and says pulse was weak HR 91. Retook BP in a bit and BP 90/58 but pulse was strong at that time. Says he has not had AF by his machine. Pees a lot at night and feels dehydrated. Tried to do orthostatics with his BP cuff but couldn't get a BP when standing up. Oxygen is good. No CP.    Cardiac studies:  03/29/2015 cMRI EF 36% mild hyperenhancement suggestive of myocarditis. Moderate pericardial effusion  Last Echo 8/18 EF 50-55% no effusion.   [facility] 03/25/2015 RA 9  RV 33/6 (21)  PCWP 9  Thermo 5.8/2.5  Fick 4.2/1.8  LHC 03/28/2015--> normal cors    Ethan Alexander denies symptoms worrisome for COVID 19.   Past Medical History:  Diagnosis Date   Arthritis    CHF (congestive heart failure) ([place])    Diabetes mellitus    diet controlled   Foot drop, left    Hypertension    Osteomyelitis ([place]) 2010   left foot   Osteomyelitis ([place]) 2008/2010   left  foot- metatarsal- per patient was MRSA   Renal disorder    Ruptured lumbar disc    L5-S1   x 2 disc   Umbilical hernia    Past Surgical History:  Procedure Laterality Date   ACHILLES TENDON SURGERY     left   CARDIAC CATHETERIZATION N/A 03/25/2015   Procedure: Right Heart Cath;  Surgeon: Leonie Green  Ellyn Hack, MD;  Location: [place] [facility];  Service: Cardiovascular;  Laterality: N/A;   CARDIAC CATHETERIZATION N/A 03/28/2015   Procedure: Left Heart Cath;  Surgeon: Jolaine Artist, MD;  Location: [place] [facility];  Service: Cardiovascular;  Laterality: N/A;   CARDIOVERSION N/A 03/27/2015   Procedure: CARDIOVERSION;  Surgeon: Jolaine Artist, MD;  Location: [place];  Service: Cardiovascular;  Laterality: N/A;   EYE SURGERY     cataract ectraction with IOL-  bilateral   FOOT SURGERY  2008/2010   x 2  left foot/ osteomyelitis   TOTAL HIP ARTHROPLASTY  06/11/2012   Procedure: TOTAL HIP ARTHROPLASTY;  Surgeon: Gearlean Alf, MD;  Location: [facility];  Service:  Orthopedics;  Laterality: Left;     Current Outpatient Medications  Medication Sig Dispense Refill   Abatacept (ORENCIA) 125 MG/ML SOSY Inject 125 mg into the skin once a week.      carvedilol (COREG) 6.25 MG tablet TAKE 1 TABLET (6.25 MG TOTAL) BY MOUTH 2 (TWO) TIMES DAILY WITH A MEAL. 180 tablet 1   ELIQUIS 5 MG TABS tablet TAKE 1 TABLET(5 MG) BY MOUTH TWICE DAILY 60 tablet 3   escitalopram (LEXAPRO) 10 MG tablet Take 10 mg by mouth daily.     folic acid (FOLVITE) 1 MG tablet Take 1 mg by mouth daily.     predniSONE (DELTASONE) 10 MG tablet Take 1 tablet (10 mg total) by mouth daily with breakfast. 90 tablet 2   sacubitril-valsartan (ENTRESTO) 49-51 MG Take 1 tablet by mouth 2 (two) times daily. 60 tablet 11   traMADol (ULTRAM) 50 MG tablet Take 50 mg by mouth daily as needed. Take 1 50mg  tablet daily as needed     No current facility-administered medications for this encounter.     Allergies:   Sulfa drugs cross reactors, Tape, and Sulfa antibiotics   Social History:  The patient  reports that he quit smoking about 13 years ago. He has a 25.00 pack-year smoking history. He has never used smokeless tobacco. He reports current alcohol use. He reports that he does not use drugs.   Family History:  The patient's family history includes Heart disease in his father.   ROS:  Please see the history of present illness.   All other systems are personally reviewed and negative.   Exam:  (Video/Tele Health Call; Exam is subjective and or/visual.) General:  Speaks in full sentences. No resp difficulty. Lungs: Normal respiratory effort with conversation.  Abdomen: Non-distended per patient report Extremities: Pt denies edema. Neuro: Alert & oriented x 3.   Recent Labs: No results found for requested labs within last 8760 hours.  Personally reviewed   Wt Readings from Last 3 Encounters:  07/10/18 100.3 kg (221 lb 3.2 oz)  04/20/17 99.8 kg (220 lb)  02/15/17 100 kg (220 lb 8 oz)       ASSESSMENT AND PLAN:  1. Hypotension, suspect orthostatic hypotension -cut entresto to 24/26 bid - push salt and fluid intake - continue close f/u on BP - can wear compression stockings - if no better, consider w/u for PE (after hip injury) +/- addition of midodrine  2. Chronic systolic HF  - cMRI [id number] EF 36% - EF ~45-50% 08/03/15 - 03/28/15 LHC normal cors.  - Echo 8/18 EF 50-55% no effusion. - BP low. Decrease entresto as above. May need to stop  3. Pericardial effusion, moderate  --no tamponade by RHC 7/16 --resolved by echo.  4. RA  5. DM2 with  h/o osteomyelitis LLE  6. Chronic LBBB  7. Paroxysmal Atrial fibrillation/flutter with RVR s/p DC-CV 7/24.  (see ECG from 7/24 and 8/3 for AFL) CHA2DS2VASC is 3. --30 day event monitor with no AF on amio in 9/16 --Remains in NSR  --Has seen Dr. Caryl Comes in 3/17 and amio stopped --No evidence recurrent AF. He wants to consider stopping AC. Previously sdiscussed pros/cons and risk of stroke. Offered repeat 30-day monitor but he says he wil stay on [facility] for now.  8. OSA - severe with AHI 76. Continue CPAP.  9. Recent hip fracture   COVID screen The patient does not have any symptoms that suggest any further testing/ screening at this time.  Social distancing reinforced today.  Recommended follow-up:  As above  Relevant cardiac medications were reviewed at length with the patient today.   The patient does not have concerns regarding their medications at this time.   The following changes were made today:  As above  Today, I have spent 22 minutes with the patient with telehealth technology discussing the above issues .    Signed, Glori Bickers, MD  07/02/2019 4:25 PM  Advanced Heart Failure [place] [address] [phone] (office) [phone] (fax)

## 2019-07-03 ENCOUNTER — Encounter (HOSPITAL_COMMUNITY): Payer: Self-pay

## 2019-07-03 ENCOUNTER — Telehealth (HOSPITAL_COMMUNITY): Payer: Self-pay | Admitting: Cardiology

## 2019-07-03 MED ORDER — SACUBITRIL-VALSARTAN 24-26 MG PO TABS: 1.0000 | ORAL_TABLET | Freq: Two times a day (BID) | ORAL | 5 refills | Status: DC

## 2019-07-03 NOTE — Telephone Encounter (Signed)
[email or web address]  Patient called to report he does not have compression stocking at home as he thought during virtual appointment.  Please email order to address above- open foot is needed as he has a pressure ulcer on his foot

## 2019-07-03 NOTE — Addendum Note (Signed)
Encounter addended by: Valeda Malm, RN on: 07/03/2019 9:07 AM  Actions taken: Pharmacy for encounter modified, Order list changed, Clinical Note Signed

## 2019-07-03 NOTE — Progress Notes (Signed)
Spoke with patient this morning as follow up to yesterdays virtual visit.  AVS discussed, pt verbalized understanding.  All information sent on mychart.

## 2019-07-03 NOTE — Patient Instructions (Signed)
DECREASE Entresto to 24/26mg  (1 tab) twice a day  Please check your blood pressure daily and keep a log of it.  Dr Haroldine Laws will discuss at your next appt.  WEAR compression stockings all day, everyday.  Can be removed for bedtime.  Your physician recommends that you schedule a follow-up appointment in: 2 weeks virtual visit.  At the [facility], you and your health needs are our priority. As part of our continuing mission to provide you with exceptional heart care, we have created designated Provider Care Teams. These Care Teams include your primary Cardiologist (physician) and Advanced Practice Providers (APPs- Physician Assistants and Nurse Practitioners) who all work together to provide you with the care you need, when you need it.   You may see any of the following providers on your designated Care Team at your next follow up: Marland Kitchen Dr Glori Bickers . Dr Loralie Champagne . Darrick Grinder, NP . Lyda Jester, PA   Please be sure to bring in all your medications bottles to every appointment.

## 2019-07-05 DIAGNOSIS — S3210XD Unspecified fracture of sacrum, subsequent encounter for fracture with routine healing: Secondary | ICD-10-CM | POA: Diagnosis not present

## 2019-07-05 DIAGNOSIS — S32502D Unspecified fracture of left pubis, subsequent encounter for fracture with routine healing: Secondary | ICD-10-CM | POA: Diagnosis not present

## 2019-07-05 DIAGNOSIS — U071 COVID-19: Secondary | ICD-10-CM | POA: Diagnosis not present

## 2019-07-05 DIAGNOSIS — I5022 Chronic systolic (congestive) heart failure: Secondary | ICD-10-CM | POA: Diagnosis not present

## 2019-07-05 DIAGNOSIS — I2510 Atherosclerotic heart disease of native coronary artery without angina pectoris: Secondary | ICD-10-CM | POA: Diagnosis not present

## 2019-07-05 DIAGNOSIS — S0101XD Laceration without foreign body of scalp, subsequent encounter: Secondary | ICD-10-CM | POA: Diagnosis not present

## 2019-07-05 DIAGNOSIS — I110 Hypertensive heart disease with heart failure: Secondary | ICD-10-CM | POA: Diagnosis not present

## 2019-07-07 NOTE — Telephone Encounter (Signed)
Order emailed to patient at [email or web address]

## 2019-08-12 DIAGNOSIS — I5020 Unspecified systolic (congestive) heart failure: Secondary | ICD-10-CM | POA: Diagnosis not present

## 2019-08-12 DIAGNOSIS — Z6824 Body mass index (BMI) 24.0-24.9, adult: Secondary | ICD-10-CM | POA: Diagnosis not present

## 2019-08-12 DIAGNOSIS — M0589 Other rheumatoid arthritis with rheumatoid factor of multiple sites: Secondary | ICD-10-CM | POA: Diagnosis not present

## 2019-08-12 DIAGNOSIS — M810 Age-related osteoporosis without current pathological fracture: Secondary | ICD-10-CM | POA: Diagnosis not present

## 2019-08-12 DIAGNOSIS — Z23 Encounter for immunization: Secondary | ICD-10-CM | POA: Diagnosis not present

## 2019-08-12 DIAGNOSIS — M150 Primary generalized (osteo)arthritis: Secondary | ICD-10-CM | POA: Diagnosis not present

## 2019-08-12 DIAGNOSIS — E7800 Pure hypercholesterolemia, unspecified: Secondary | ICD-10-CM | POA: Diagnosis not present

## 2019-09-03 ENCOUNTER — Other Ambulatory Visit (HOSPITAL_COMMUNITY): Payer: Self-pay

## 2019-09-03 MED ORDER — APIXABAN 5 MG PO TABS: ORAL_TABLET | ORAL | 3 refills | Status: DC

## 2019-09-16 ENCOUNTER — Telehealth (HOSPITAL_COMMUNITY): Payer: Self-pay

## 2019-09-16 NOTE — Telephone Encounter (Signed)
Debbie from [facility] office called to get instructions for eliquis prior to scheduled biopsy, bone graft, and flap procedure. Per Dr. Haroldine Laws pt to hold eliquis for 48 hours prior if the procedure is expected to be bloody. If there is low risk for bleeding he needs to hold eliquis for 24 hours prior. Either way he may restart eliquis 24 hours post procedure. Made Debbie aware.

## 2019-09-25 DIAGNOSIS — Z23 Encounter for immunization: Secondary | ICD-10-CM | POA: Diagnosis not present

## 2019-10-23 DIAGNOSIS — Z23 Encounter for immunization: Secondary | ICD-10-CM | POA: Diagnosis not present

## 2019-12-24 ENCOUNTER — Other Ambulatory Visit (HOSPITAL_COMMUNITY): Payer: Self-pay

## 2019-12-24 MED ORDER — APIXABAN 5 MG PO TABS: ORAL_TABLET | ORAL | 5 refills | Status: DC

## 2019-12-24 MED ORDER — SACUBITRIL-VALSARTAN 24-26 MG PO TABS: 1.0000 | ORAL_TABLET | Freq: Two times a day (BID) | ORAL | 5 refills | Status: DC

## 2019-12-25 ENCOUNTER — Other Ambulatory Visit (HOSPITAL_COMMUNITY): Payer: Self-pay | Admitting: Internal Medicine

## 2020-02-10 DIAGNOSIS — M25551 Pain in right hip: Secondary | ICD-10-CM | POA: Diagnosis not present

## 2020-02-10 DIAGNOSIS — I5020 Unspecified systolic (congestive) heart failure: Secondary | ICD-10-CM | POA: Diagnosis not present

## 2020-02-10 DIAGNOSIS — M810 Age-related osteoporosis without current pathological fracture: Secondary | ICD-10-CM | POA: Diagnosis not present

## 2020-02-10 DIAGNOSIS — M150 Primary generalized (osteo)arthritis: Secondary | ICD-10-CM | POA: Diagnosis not present

## 2020-02-10 DIAGNOSIS — E7800 Pure hypercholesterolemia, unspecified: Secondary | ICD-10-CM | POA: Diagnosis not present

## 2020-02-10 DIAGNOSIS — Z6825 Body mass index (BMI) 25.0-25.9, adult: Secondary | ICD-10-CM | POA: Diagnosis not present

## 2020-02-10 DIAGNOSIS — E663 Overweight: Secondary | ICD-10-CM | POA: Diagnosis not present

## 2020-02-10 DIAGNOSIS — M0589 Other rheumatoid arthritis with rheumatoid factor of multiple sites: Secondary | ICD-10-CM | POA: Diagnosis not present

## 2020-03-09 DIAGNOSIS — E785 Hyperlipidemia, unspecified: Secondary | ICD-10-CM | POA: Diagnosis not present

## 2020-03-09 DIAGNOSIS — Z125 Encounter for screening for malignant neoplasm of prostate: Secondary | ICD-10-CM | POA: Diagnosis not present

## 2020-03-09 DIAGNOSIS — Z1331 Encounter for screening for depression: Secondary | ICD-10-CM | POA: Diagnosis not present

## 2020-03-09 DIAGNOSIS — E1169 Type 2 diabetes mellitus with other specified complication: Secondary | ICD-10-CM | POA: Diagnosis not present

## 2020-03-09 DIAGNOSIS — Z0000 Encounter for general adult medical examination without abnormal findings: Secondary | ICD-10-CM | POA: Diagnosis not present

## 2020-03-09 DIAGNOSIS — Z1339 Encounter for screening examination for other mental health and behavioral disorders: Secondary | ICD-10-CM | POA: Diagnosis not present

## 2020-03-31 DIAGNOSIS — E1165 Type 2 diabetes mellitus with hyperglycemia: Secondary | ICD-10-CM | POA: Diagnosis not present

## 2020-03-31 DIAGNOSIS — Z6826 Body mass index (BMI) 26.0-26.9, adult: Secondary | ICD-10-CM | POA: Diagnosis not present

## 2020-06-20 ENCOUNTER — Other Ambulatory Visit (HOSPITAL_COMMUNITY): Payer: Self-pay | Admitting: Internal Medicine

## 2020-06-21 ENCOUNTER — Other Ambulatory Visit (HOSPITAL_COMMUNITY): Payer: Self-pay | Admitting: Internal Medicine

## 2020-06-30 ENCOUNTER — Telehealth (HOSPITAL_COMMUNITY): Payer: Self-pay | Admitting: *Deleted

## 2020-06-30 DIAGNOSIS — Z79899 Other long term (current) drug therapy: Secondary | ICD-10-CM | POA: Diagnosis not present

## 2020-06-30 DIAGNOSIS — E1165 Type 2 diabetes mellitus with hyperglycemia: Secondary | ICD-10-CM | POA: Diagnosis not present

## 2020-06-30 NOTE — Telephone Encounter (Signed)
Received fax, pt needs clearance for a dental procedure under moderate IV sedation.  Per Dr Haroldine Laws pt is medically cleared to proceed.  Note faxed back to them at [phone]

## 2020-07-18 DIAGNOSIS — Z23 Encounter for immunization: Secondary | ICD-10-CM | POA: Diagnosis not present

## 2020-08-11 DIAGNOSIS — M810 Age-related osteoporosis without current pathological fracture: Secondary | ICD-10-CM | POA: Diagnosis not present

## 2020-08-11 DIAGNOSIS — Z6824 Body mass index (BMI) 24.0-24.9, adult: Secondary | ICD-10-CM | POA: Diagnosis not present

## 2020-08-11 DIAGNOSIS — M150 Primary generalized (osteo)arthritis: Secondary | ICD-10-CM | POA: Diagnosis not present

## 2020-08-11 DIAGNOSIS — I5020 Unspecified systolic (congestive) heart failure: Secondary | ICD-10-CM | POA: Diagnosis not present

## 2020-08-11 DIAGNOSIS — E7800 Pure hypercholesterolemia, unspecified: Secondary | ICD-10-CM | POA: Diagnosis not present

## 2020-08-11 DIAGNOSIS — M0589 Other rheumatoid arthritis with rheumatoid factor of multiple sites: Secondary | ICD-10-CM | POA: Diagnosis not present

## 2020-08-11 DIAGNOSIS — M25551 Pain in right hip: Secondary | ICD-10-CM | POA: Diagnosis not present

## 2020-09-27 DIAGNOSIS — N401 Enlarged prostate with lower urinary tract symptoms: Secondary | ICD-10-CM | POA: Diagnosis not present

## 2020-09-27 DIAGNOSIS — R310 Gross hematuria: Secondary | ICD-10-CM | POA: Diagnosis not present

## 2020-10-03 DIAGNOSIS — R310 Gross hematuria: Secondary | ICD-10-CM | POA: Diagnosis not present

## 2020-10-03 DIAGNOSIS — D49511 Neoplasm of unspecified behavior of right kidney: Secondary | ICD-10-CM | POA: Diagnosis not present

## 2020-10-03 DIAGNOSIS — C641 Malignant neoplasm of right kidney, except renal pelvis: Secondary | ICD-10-CM | POA: Diagnosis not present

## 2020-10-11 DIAGNOSIS — N401 Enlarged prostate with lower urinary tract symptoms: Secondary | ICD-10-CM | POA: Diagnosis not present

## 2020-10-11 DIAGNOSIS — R310 Gross hematuria: Secondary | ICD-10-CM | POA: Diagnosis not present

## 2020-11-22 DIAGNOSIS — N281 Cyst of kidney, acquired: Secondary | ICD-10-CM | POA: Diagnosis not present

## 2020-12-18 ENCOUNTER — Other Ambulatory Visit (HOSPITAL_COMMUNITY): Payer: Self-pay | Admitting: Internal Medicine

## 2020-12-20 ENCOUNTER — Other Ambulatory Visit (HOSPITAL_COMMUNITY): Payer: Self-pay | Admitting: Internal Medicine

## 2020-12-20 DIAGNOSIS — L918 Other hypertrophic disorders of the skin: Secondary | ICD-10-CM | POA: Diagnosis not present

## 2020-12-20 DIAGNOSIS — L820 Inflamed seborrheic keratosis: Secondary | ICD-10-CM | POA: Diagnosis not present

## 2021-01-11 ENCOUNTER — Other Ambulatory Visit (HOSPITAL_COMMUNITY): Payer: Self-pay | Admitting: *Deleted

## 2021-01-11 MED ORDER — CARVEDILOL 6.25 MG PO TABS: 6.2500 mg | ORAL_TABLET | Freq: Two times a day (BID) | ORAL | 0 refills | Status: DC

## 2021-01-17 ENCOUNTER — Other Ambulatory Visit (HOSPITAL_COMMUNITY): Payer: Self-pay | Admitting: Internal Medicine

## 2021-01-25 ENCOUNTER — Other Ambulatory Visit (HOSPITAL_COMMUNITY): Payer: Self-pay | Admitting: Internal Medicine

## 2021-02-02 ENCOUNTER — Ambulatory Visit (HOSPITAL_COMMUNITY)
Admission: RE | Admit: 2021-02-02 | Discharge: 2021-02-02 | Disposition: A | Discharge status: Home / Self Care | Payer: Medicare Other | Source: Ambulatory Visit | Attending: Adult Health | Admitting: Adult Health

## 2021-02-02 ENCOUNTER — Encounter (HOSPITAL_COMMUNITY): Payer: Self-pay

## 2021-02-02 ENCOUNTER — Other Ambulatory Visit: Payer: Self-pay

## 2021-02-02 VITALS — BP 96/68 | HR 76 | Wt 210.8 lb

## 2021-02-02 DIAGNOSIS — I313 Pericardial effusion (noninflammatory): Secondary | ICD-10-CM | POA: Diagnosis not present

## 2021-02-02 DIAGNOSIS — I5032 Chronic diastolic (congestive) heart failure: Secondary | ICD-10-CM

## 2021-02-02 DIAGNOSIS — I5022 Chronic systolic (congestive) heart failure: Secondary | ICD-10-CM | POA: Diagnosis not present

## 2021-02-02 DIAGNOSIS — Z7952 Long term (current) use of systemic steroids: Secondary | ICD-10-CM | POA: Diagnosis not present

## 2021-02-02 DIAGNOSIS — Z9989 Dependence on other enabling machines and devices: Secondary | ICD-10-CM | POA: Diagnosis not present

## 2021-02-02 DIAGNOSIS — I4892 Unspecified atrial flutter: Secondary | ICD-10-CM | POA: Diagnosis not present

## 2021-02-02 DIAGNOSIS — I447 Left bundle-branch block, unspecified: Secondary | ICD-10-CM | POA: Insufficient documentation

## 2021-02-02 DIAGNOSIS — Z87891 Personal history of nicotine dependence: Secondary | ICD-10-CM | POA: Diagnosis not present

## 2021-02-02 DIAGNOSIS — Z8249 Family history of ischemic heart disease and other diseases of the circulatory system: Secondary | ICD-10-CM | POA: Diagnosis not present

## 2021-02-02 DIAGNOSIS — Z79899 Other long term (current) drug therapy: Secondary | ICD-10-CM | POA: Insufficient documentation

## 2021-02-02 DIAGNOSIS — G4733 Obstructive sleep apnea (adult) (pediatric): Secondary | ICD-10-CM | POA: Diagnosis not present

## 2021-02-02 DIAGNOSIS — I110 Hypertensive heart disease with heart failure: Secondary | ICD-10-CM | POA: Diagnosis not present

## 2021-02-02 DIAGNOSIS — Z7901 Long term (current) use of anticoagulants: Secondary | ICD-10-CM | POA: Insufficient documentation

## 2021-02-02 DIAGNOSIS — E1169 Type 2 diabetes mellitus with other specified complication: Secondary | ICD-10-CM | POA: Insufficient documentation

## 2021-02-02 DIAGNOSIS — I480 Paroxysmal atrial fibrillation: Secondary | ICD-10-CM

## 2021-02-02 LAB — CBC
HCT: 42.8 % (ref 39.0–52.0)
Hemoglobin: 13.5 g/dL (ref 13.0–17.0)
MCH: 30.3 pg (ref 26.0–34.0)
MCHC: 31.5 g/dL (ref 30.0–36.0)
MCV: 96.2 fL (ref 80.0–100.0)
Platelets: 247 10*3/uL (ref 150–400)
RBC: 4.45 MIL/uL (ref 4.22–5.81)
RDW: 14.3 % (ref 11.5–15.5)
WBC: 5.8 10*3/uL (ref 4.0–10.5)
nRBC: 0 % (ref 0.0–0.2)

## 2021-02-02 LAB — BASIC METABOLIC PANEL
Anion gap: 8 (ref 5–15)
BUN: 21 mg/dL (ref 8–23)
CO2: 25 mmol/L (ref 22–32)
Calcium: 8.9 mg/dL (ref 8.9–10.3)
Chloride: 105 mmol/L (ref 98–111)
Creatinine, Ser: 1.08 mg/dL (ref 0.61–1.24)
GFR, Estimated: >60 mL/min (ref >60)
Glucose, Bld: 138 mg/dL — ABNORMAL HIGH (ref 70–99)
Potassium: 4.1 mmol/L (ref 3.5–5.1)
Sodium: 138 mmol/L (ref 135–145)

## 2021-02-02 NOTE — Patient Instructions (Signed)
It was great to see you today! No medication changes are needed at this time.   Labs today We will only contact you if something comes back abnormal or we need to make some changes. Otherwise no news is good news!  Your physician recommends that you schedule a follow-up appointment in: 3 months with Dr Haroldine Laws and echo  Your physician has requested that you have an echocardiogram. Echocardiography is a painless test that uses sound waves to create images of your heart. It provides your doctor with information about the size and shape of your heart and how well your heart's chambers and valves are working. This procedure takes approximately one hour. There are no restrictions for this procedure.  Do the following things EVERYDAY: 1) Weigh yourself in the morning before breakfast. Write it down and keep it in a log. 2) Take your medicines as prescribed 3) Eat low salt foods--Limit salt (sodium) to 2000 mg per day.  4) Stay as active as you can everyday 5) Limit all fluids for the day to less than 2 liters  At the [facility], you and your health needs are our priority. As part of our continuing mission to provide you with exceptional heart care, we have created designated Provider Care Teams. These Care Teams include your primary Cardiologist (physician) and Advanced Practice Providers (APPs- Physician Assistants and Nurse Practitioners) who all work together to provide you with the care you need, when you need it.   You may see any of the following providers on your designated Care Team at your next follow up: Marland Kitchen Dr Glori Bickers . Dr Loralie Champagne . Dr Vickki Muff . Darrick Grinder, NP . Lyda Jester, [place] . Audry Riles, PharmD   Please be sure to bring in all your medications bottles to every appointment.   If you have any questions or concerns before your next appointment please send [place] a message through [place] or call our office at  [phone].    TO LEAVE A MESSAGE FOR THE NURSE SELECT OPTION 2, PLEASE LEAVE A MESSAGE INCLUDING: . YOUR NAME . DATE OF BIRTH . CALL BACK NUMBER . REASON FOR CALL**this is important as we prioritize the call backs  YOU WILL RECEIVE A CALL BACK THE SAME DAY AS LONG AS YOU CALL BEFORE 4:00 PM

## 2021-02-02 NOTE — Progress Notes (Signed)
Patient ID: Ethan Alexander, male   DOB: March 11, 1951, 70 y.o.   MRN: [id number]   Advanced Heart Failure Clinic Note   Primary Care: Trilby Drummer Primary HF Cardiologist: Bensimhon Rheumatology: Amil Amen  HPI: Ethan Alexander is a 70 y.o. with hx of recently diagnosed systolic HF (EF 61%), RA (on chronic prednisone - followed by Dr. Ouida Sills), DM2, LBBB admitted with CP and found    Was admitted on 03/26/15 with CP and found to have moderate pericardial effusion, with thickened RV free wall and EF 30%. CT of chest negative for PE. He was taken to cath lab no evidence of tamponade. Normal coronaries.  Developed A fib RVR and was started on amio + cardizem drip. He was cardioverted and placed in oral amiodarone.     In August 2016 he presented to clinic with AFL but spontaneously converted. Amio increased to 400 bid. Bedside echo 40-45%. Pericardial effusion essentially resolved. Wore 30-day monitor. Amio decreased to 200 bid.  Failed sleep study with AHI 76. Now on CPAP.  S/p pan-metatarsal resection on the left foot at [facility] Had partial R kidney resection in 1/19 and was benign   Had COVID 10/2020 but has recovered.   Today he returns for HF follow up.Overall feeling fine. Denies SOB/PND/Orthopnea. Riding stationary bike 10-20 minutes per day. Limited by joint pain. Appetite ok. No fever or chills. Weight at home stable. Taking all medications. Uses CPAP nightly.   Cardiac Testing 03/29/2015 cMRI EF 36% mild hyperenhancement suggestive of myocarditis. Moderate pericardial effusion  Bedside echo 8/16: 40-45% pericardial effusion resolved Echo 08/03/15: EF 40-45% in setting of LBBB and septal dyssynchrony. RV normal. No pericardial effusion.  Strain - 17%  Echo 7/17 EF 45-50% Echo 8/18 EF 50-55% no effusion.   [place] 03/25/2015 RA 9  RV 33/6 (21)  PCWP 9  Thermo 5.8/2.5  Fick 4.2/1.8  LHC 03/28/2015--> normal cors   9/16: 30-day event monitor no AFL    Past Medical History:   Diagnosis Date  . Arthritis   . CHF (congestive heart failure) ([place])   . Diabetes mellitus    diet controlled  . Foot drop, left   . Hypertension   . Osteomyelitis ([place]) 2010   left foot  . Osteomyelitis ([place]) 2008/2010   left  foot- metatarsal- per patient was MRSA  . Renal disorder   . Ruptured lumbar disc    L5-S1   x 2 disc  . Umbilical hernia     Current Outpatient Medications  Medication Sig Dispense Refill  . Abatacept 125 MG/ML SOSY Inject 125 mg into the skin once a week.     Marland Kitchen atorvastatin (LIPITOR) 10 MG tablet Take 10 mg by mouth daily.    . carvedilol (COREG) 6.25 MG tablet TAKE 1 TABLET(6.25 MG) BY MOUTH TWICE DAILY WITH A MEAL 60 tablet 0  . ELIQUIS 5 MG TABS tablet TAKE 1 TABLET(5 MG) BY MOUTH TWICE DAILY 60 tablet 1  . escitalopram (LEXAPRO) 10 MG tablet Take 10 mg by mouth daily.    . metFORMIN (GLUCOPHAGE-XR) 500 MG 24 hr tablet Take 500 mg by mouth daily with breakfast.    . predniSONE (DELTASONE) 10 MG tablet Take 1 tablet (10 mg total) by mouth daily with breakfast. 90 tablet 2  . sacubitril-valsartan (ENTRESTO) 24-26 MG Take 1 tablet by mouth 2 (two) times daily. Needs appt for further refills 60 tablet 2  . traMADol (ULTRAM) 50 MG tablet Take 50 mg by mouth daily as needed. Take 1  50mg  tablet daily as needed     No current facility-administered medications for this encounter.    Allergies  Allergen Reactions  . Sulfa Drugs Cross Reactors Other (See Comments)    Flu like symptoms   . Tape     Tears his skin  . Sulfa Antibiotics Other (See Comments)      Social History   Socioeconomic History  . Marital status: Married    Spouse name: Not on file  . Number of children: Not on file  . Years of education: Not on file  . Highest education level: Not on file  Occupational History  . Not on file  Tobacco Use  . Smoking status: Former Smoker    Packs/day: 1.00    Years: 25.00    Pack years: 25.00    Quit date: 06/05/2006    Years since  quitting: 14.6  . Smokeless tobacco: Never Used  Vaping Use  . Vaping Use: Never used  Substance and Sexual Activity  . Alcohol use: Yes    Comment: socially  . Drug use: No  . Sexual activity: Not on file  Other Topics Concern  . Not on file  Social History Narrative  . Not on file   Social Determinants of Health   Financial Resource Strain: Not on file  Food Insecurity: Not on file  Transportation Needs: Not on file  Physical Activity: Not on file  Stress: Not on file  Social Connections: Not on file  Intimate Partner Violence: Not on file      Family History  Problem Relation Age of Onset  . Heart disease Father     Vitals:   02/02/21 1503  BP: 96/68  Pulse: 76  SpO2: 95%  Weight: 95.6 kg (210 lb 12.8 oz)   Wt Readings from Last 3 Encounters:  02/02/21 95.6 kg (210 lb 12.8 oz)  07/10/18 100.3 kg (221 lb 3.2 oz)  04/20/17 99.8 kg (220 lb)    PHYSICAL EXAM: General:  Well appearing. No resp difficulty. Walked in the clinic.  HEENT: normal Neck: supple. no JVD. Carotids 2+ bilat; no bruits. No lymphadenopathy or thryomegaly appreciated. Cor: PMI nondisplaced. Regular rate & rhythm. No rubs, gallops or murmurs. Lungs: clear Abdomen: soft, nontender, nondistended. No hepatosplenomegaly. No bruits or masses. Good bowel sounds. Extremities: no cyanosis, clubbing, rash, edema Neuro: alert & orientedx3, cranial nerves grossly intact. moves all 4 extremities w/o difficulty. Affect pleasant   ASSESSMENT & PLAN: 1. Chronic systolic HF  - cMRI 6/78 EF 36%. EF ~45-50% 08/03/15.  - 03/28/15 LHC normal cors.  - Echo 8/18 EF 50-55% no effusion - NYHA I. Volume status stable. Continue current dose of coreg and entresto.  - check BMET.  - 2. Pericardial effusion, moderate  --no tamponade by RHC 7/16 3. RA  Per Rheumatology.  4. DM2 with h/o osteomyelitis LLE  5. Chronic LBBB  7. Paroxysmal Atrial fibrillation/flutter with RVR s/p DC-CV 7/24.  (see ECG from  7/24 and 8/3 for AFL) CHA2DS2VASC is 3. --30 day event monitor with no AF on amio in 9/16 --Has seen Dr. Caryl Comes in 3/17 and amio stopped --No evidence recurrent AF. He wants to consider stopping AC. Previously sdiscussed pros/cons and risk of stroke.  . On eliquis 5 mg twice a day. Check CBC 8.Marland Kitchen OSA - severe with AHI 76.  - Continue CPAP nightly.   Follow up with Dr Haroldine Laws with an ECHO in 4 months.   Maleni Seyer, NP-C  3:10 PM

## 2021-02-13 DIAGNOSIS — E7800 Pure hypercholesterolemia, unspecified: Secondary | ICD-10-CM | POA: Diagnosis not present

## 2021-02-13 DIAGNOSIS — M150 Primary generalized (osteo)arthritis: Secondary | ICD-10-CM | POA: Diagnosis not present

## 2021-02-13 DIAGNOSIS — M0589 Other rheumatoid arthritis with rheumatoid factor of multiple sites: Secondary | ICD-10-CM | POA: Diagnosis not present

## 2021-02-13 DIAGNOSIS — I5020 Unspecified systolic (congestive) heart failure: Secondary | ICD-10-CM | POA: Diagnosis not present

## 2021-02-13 DIAGNOSIS — M25551 Pain in right hip: Secondary | ICD-10-CM | POA: Diagnosis not present

## 2021-02-13 DIAGNOSIS — M810 Age-related osteoporosis without current pathological fracture: Secondary | ICD-10-CM | POA: Diagnosis not present

## 2021-02-13 DIAGNOSIS — Z6824 Body mass index (BMI) 24.0-24.9, adult: Secondary | ICD-10-CM | POA: Diagnosis not present

## 2021-02-22 ENCOUNTER — Other Ambulatory Visit (HOSPITAL_COMMUNITY): Payer: Self-pay | Admitting: Internal Medicine

## 2021-03-09 ENCOUNTER — Other Ambulatory Visit: Payer: Self-pay | Admitting: Rheumatology

## 2021-03-09 DIAGNOSIS — M810 Age-related osteoporosis without current pathological fracture: Secondary | ICD-10-CM

## 2021-03-18 ENCOUNTER — Other Ambulatory Visit (HOSPITAL_COMMUNITY): Payer: Self-pay | Admitting: Internal Medicine

## 2021-04-05 ENCOUNTER — Other Ambulatory Visit (HOSPITAL_COMMUNITY): Payer: Self-pay | Admitting: Internal Medicine

## 2021-04-19 DIAGNOSIS — Z125 Encounter for screening for malignant neoplasm of prostate: Secondary | ICD-10-CM | POA: Diagnosis not present

## 2021-04-19 DIAGNOSIS — G4733 Obstructive sleep apnea (adult) (pediatric): Secondary | ICD-10-CM | POA: Diagnosis not present

## 2021-04-19 DIAGNOSIS — I480 Paroxysmal atrial fibrillation: Secondary | ICD-10-CM | POA: Diagnosis not present

## 2021-04-19 DIAGNOSIS — M0579 Rheumatoid arthritis with rheumatoid factor of multiple sites without organ or systems involvement: Secondary | ICD-10-CM | POA: Diagnosis not present

## 2021-04-19 DIAGNOSIS — I5022 Chronic systolic (congestive) heart failure: Secondary | ICD-10-CM | POA: Diagnosis not present

## 2021-04-19 DIAGNOSIS — E785 Hyperlipidemia, unspecified: Secondary | ICD-10-CM | POA: Diagnosis not present

## 2021-04-19 DIAGNOSIS — Z0000 Encounter for general adult medical examination without abnormal findings: Secondary | ICD-10-CM | POA: Diagnosis not present

## 2021-04-19 DIAGNOSIS — Z1331 Encounter for screening for depression: Secondary | ICD-10-CM | POA: Diagnosis not present

## 2021-04-19 DIAGNOSIS — Z6825 Body mass index (BMI) 25.0-25.9, adult: Secondary | ICD-10-CM | POA: Diagnosis not present

## 2021-04-19 DIAGNOSIS — D6869 Other thrombophilia: Secondary | ICD-10-CM | POA: Diagnosis not present

## 2021-04-19 DIAGNOSIS — Z79899 Other long term (current) drug therapy: Secondary | ICD-10-CM | POA: Diagnosis not present

## 2021-04-19 DIAGNOSIS — E1169 Type 2 diabetes mellitus with other specified complication: Secondary | ICD-10-CM | POA: Diagnosis not present

## 2021-05-17 ENCOUNTER — Other Ambulatory Visit (HOSPITAL_COMMUNITY): Payer: Self-pay | Admitting: Internal Medicine

## 2021-05-22 DIAGNOSIS — J439 Emphysema, unspecified: Secondary | ICD-10-CM | POA: Diagnosis not present

## 2021-05-22 DIAGNOSIS — K5730 Diverticulosis of large intestine without perforation or abscess without bleeding: Secondary | ICD-10-CM | POA: Diagnosis not present

## 2021-05-22 DIAGNOSIS — I700 Atherosclerosis of aorta: Secondary | ICD-10-CM | POA: Diagnosis not present

## 2021-05-22 DIAGNOSIS — C641 Malignant neoplasm of right kidney, except renal pelvis: Secondary | ICD-10-CM | POA: Diagnosis not present

## 2021-05-22 DIAGNOSIS — D49511 Neoplasm of unspecified behavior of right kidney: Secondary | ICD-10-CM | POA: Diagnosis not present

## 2021-05-22 DIAGNOSIS — K5790 Diverticulosis of intestine, part unspecified, without perforation or abscess without bleeding: Secondary | ICD-10-CM | POA: Diagnosis not present

## 2021-05-22 DIAGNOSIS — N281 Cyst of kidney, acquired: Secondary | ICD-10-CM | POA: Diagnosis not present

## 2021-05-23 DIAGNOSIS — T148XXA Other injury of unspecified body region, initial encounter: Secondary | ICD-10-CM | POA: Diagnosis not present

## 2021-05-25 DIAGNOSIS — R310 Gross hematuria: Secondary | ICD-10-CM | POA: Diagnosis not present

## 2021-05-25 DIAGNOSIS — N2889 Other specified disorders of kidney and ureter: Secondary | ICD-10-CM | POA: Diagnosis not present

## 2021-05-25 DIAGNOSIS — N281 Cyst of kidney, acquired: Secondary | ICD-10-CM | POA: Diagnosis not present

## 2021-05-29 DIAGNOSIS — R310 Gross hematuria: Secondary | ICD-10-CM | POA: Diagnosis not present

## 2021-05-30 ENCOUNTER — Ambulatory Visit (HOSPITAL_COMMUNITY)
Admission: RE | Admit: 2021-05-30 | Discharge: 2021-05-30 | Disposition: A | Discharge status: Home / Self Care | Payer: Medicare Other | Source: Ambulatory Visit | Attending: Internal Medicine | Admitting: Internal Medicine

## 2021-05-30 ENCOUNTER — Encounter (HOSPITAL_COMMUNITY): Payer: Self-pay | Admitting: Internal Medicine

## 2021-05-30 ENCOUNTER — Ambulatory Visit (HOSPITAL_BASED_OUTPATIENT_CLINIC_OR_DEPARTMENT_OTHER)
Admission: RE | Admit: 2021-05-30 | Discharge: 2021-05-30 | Disposition: A | Discharge status: Home / Self Care | Payer: Medicare Other | Source: Ambulatory Visit | Attending: Internal Medicine | Admitting: Internal Medicine

## 2021-05-30 ENCOUNTER — Other Ambulatory Visit: Payer: Self-pay

## 2021-05-30 VITALS — BP 134/89 | HR 65 | Wt 214.6 lb

## 2021-05-30 DIAGNOSIS — G4733 Obstructive sleep apnea (adult) (pediatric): Secondary | ICD-10-CM | POA: Diagnosis not present

## 2021-05-30 DIAGNOSIS — I110 Hypertensive heart disease with heart failure: Secondary | ICD-10-CM | POA: Diagnosis not present

## 2021-05-30 DIAGNOSIS — I447 Left bundle-branch block, unspecified: Secondary | ICD-10-CM | POA: Insufficient documentation

## 2021-05-30 DIAGNOSIS — Z9989 Dependence on other enabling machines and devices: Secondary | ICD-10-CM

## 2021-05-30 DIAGNOSIS — Z87891 Personal history of nicotine dependence: Secondary | ICD-10-CM | POA: Insufficient documentation

## 2021-05-30 DIAGNOSIS — I5022 Chronic systolic (congestive) heart failure: Secondary | ICD-10-CM | POA: Diagnosis not present

## 2021-05-30 DIAGNOSIS — I313 Pericardial effusion (noninflammatory): Secondary | ICD-10-CM

## 2021-05-30 DIAGNOSIS — I3139 Other pericardial effusion (noninflammatory): Secondary | ICD-10-CM

## 2021-05-30 DIAGNOSIS — Z79899 Other long term (current) drug therapy: Secondary | ICD-10-CM | POA: Diagnosis not present

## 2021-05-30 DIAGNOSIS — I480 Paroxysmal atrial fibrillation: Secondary | ICD-10-CM | POA: Insufficient documentation

## 2021-05-30 DIAGNOSIS — Z09 Encounter for follow-up examination after completed treatment for conditions other than malignant neoplasm: Secondary | ICD-10-CM | POA: Diagnosis not present

## 2021-05-30 DIAGNOSIS — Z7952 Long term (current) use of systemic steroids: Secondary | ICD-10-CM | POA: Insufficient documentation

## 2021-05-30 DIAGNOSIS — Z8249 Family history of ischemic heart disease and other diseases of the circulatory system: Secondary | ICD-10-CM | POA: Diagnosis not present

## 2021-05-30 DIAGNOSIS — I5032 Chronic diastolic (congestive) heart failure: Secondary | ICD-10-CM | POA: Diagnosis not present

## 2021-05-30 DIAGNOSIS — E1169 Type 2 diabetes mellitus with other specified complication: Secondary | ICD-10-CM | POA: Diagnosis not present

## 2021-05-30 DIAGNOSIS — I5042 Chronic combined systolic (congestive) and diastolic (congestive) heart failure: Secondary | ICD-10-CM | POA: Insufficient documentation

## 2021-05-30 DIAGNOSIS — Z7984 Long term (current) use of oral hypoglycemic drugs: Secondary | ICD-10-CM | POA: Insufficient documentation

## 2021-05-30 DIAGNOSIS — Z7901 Long term (current) use of anticoagulants: Secondary | ICD-10-CM | POA: Diagnosis not present

## 2021-05-30 DIAGNOSIS — M069 Rheumatoid arthritis, unspecified: Secondary | ICD-10-CM | POA: Diagnosis not present

## 2021-05-30 LAB — ECHOCARDIOGRAM COMPLETE
Area-P 1/2: 3.03 cm2
Calc EF: 45.3 %
S' Lateral: 3.8 cm
Single Plane A2C EF: 48.4 %
Single Plane A4C EF: 44.1 %

## 2021-05-30 NOTE — Progress Notes (Signed)
  Echocardiogram 2D Echocardiogram has been performed.  Bobbye Charleston 05/30/2021, 2:53 PM

## 2021-05-30 NOTE — Progress Notes (Signed)
Patient ID: Ethan Alexander, male   DOB: 10/09/1950, 70 y.o.   MRN: [id number]   Advanced Heart Failure Clinic Note   Primary Care: Trilby Drummer Primary HF Cardiologist: Taquanna Borras Rheumatology: Amil Amen  HPI: Ethan Alexander is a 70 y.o. with hx of recently diagnosed systolic HF (EF 83%), RA (on chronic prednisone - followed by Dr. Ouida Sills), DM2, LBBB admitted with CP and found    Was admitted on 03/26/15 with CP and found to have moderate pericardial effusion, with thickened RV free wall and EF 30%. CT of chest negative for PE. He was taken to cath lab no evidence of tamponade. Normal coronaries.  Developed A fib RVR and was started on amio + cardizem drip.  He was cardioverted and placed in oral amiodarone.     In August 2016 he presented to clinic with AFL but spontaneously converted. Amio increased to 400 bid. Bedside echo 40-45%. Pericardial effusion essentially resolved. Wore 30-day monitor. Amio decreased to 200 bid.  Failed sleep study with AHI 76. Now on CPAP.  S/p pan-metatarsal resection on the left foot at [facility] Had partial R kidney resection in 1/19 and was benign     Today he returns for HF follow up. Doing well. 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 Exercising every day on stationary bike 10-52mins, Denies SOB, orthopnea or PND. Recently had CT scan for hernia and found to have bilateral RCC. CT at [facility] 05/25/21 Enlarging enhancing cystic neoplasms in the upper pole of the right kidney and interpolar region of the left kidney are both highly suspicious for renal cell carcinoma. Pending coil embolization with IR. Subsequently had hematuria and stopped Eliquis for past week. No further hematuris   Echo today 05/30/21 EF 40-45% RV normal. Personally reviewed  Cardiac Testing 03/29/2015 cMRI EF 36% mild hyperenhancement suggestive of myocarditis. Moderate pericardial effusion  Bedside echo 8/16: 40-45% pericardial effusion resolved Echo 08/03/15: EF 40-45% in setting of LBBB and septal  dyssynchrony. RV normal. No pericardial effusion.  Strain - 17%  Echo 7/17 EF 45-50% Echo 8/18 EF 50-55% no effusion.   [place] 03/25/2015 RA 9   RV 33/6 (21)   PCWP 9   Thermo 5.8/2.5   Fick 4.2/1.8  LHC 03/28/2015--> normal cors   9/16: 30-day event monitor no AFL    Past Medical History:  Diagnosis Date   Arthritis    CHF (congestive heart failure) (HCC)    Diabetes mellitus    diet controlled   Foot drop, left    Hypertension    Osteomyelitis ([place]) 2010   left foot   Osteomyelitis ([place]) 2008/2010   left  foot- metatarsal- per patient was MRSA   Renal disorder    Ruptured lumbar disc    L5-S1   x 2 disc   Umbilical hernia     Current Outpatient Medications  Medication Sig Dispense Refill   Abatacept 125 MG/ML SOSY Inject 125 mg into the skin once a week.      atorvastatin (LIPITOR) 10 MG tablet Take 10 mg by mouth daily.     carvedilol (COREG) 6.25 MG tablet TAKE 1 TABLET(6.25 MG) BY MOUTH TWICE DAILY WITH A MEAL 180 tablet 3   ENTRESTO 24-26 MG TAKE 1 TABLET BY MOUTH TWICE DAILY 60 tablet 1   escitalopram (LEXAPRO) 10 MG tablet Take 10 mg by mouth daily.     predniSONE (DELTASONE) 10 MG tablet Take 1 tablet (10 mg total) by mouth daily with breakfast. 90 tablet 2   ELIQUIS 5 MG  TABS tablet TAKE 1 TABLET(5 MG) BY MOUTH TWICE DAILY (Patient not taking: Reported on 05/30/2021) 60 tablet 6   metFORMIN (GLUCOPHAGE-XR) 500 MG 24 hr tablet Take 500 mg by mouth daily with breakfast. (Patient not taking: Reported on 05/30/2021)     traMADol (ULTRAM) 50 MG tablet Take 50 mg by mouth daily as needed. Take 1 50mg  tablet daily as needed     No current facility-administered medications for this encounter.    Allergies  Allergen Reactions   Sulfa Drugs Cross Reactors Other (See Comments)    Flu like symptoms    Tape     Tears his skin   Sulfa Antibiotics Other (See Comments)      Social History   Socioeconomic History   Marital status: Married    Spouse name: Not on file    Number of children: Not on file   Years of education: Not on file   Highest education level: Not on file  Occupational History   Not on file  Tobacco Use   Smoking status: Former    Packs/day: 1.00    Years: 25.00    Pack years: 25.00    Types: Cigarettes    Quit date: 06/05/2006    Years since quitting: 14.9   Smokeless tobacco: Never  Vaping Use   Vaping Use: Never used  Substance and Sexual Activity   Alcohol use: Yes    Comment: socially   Drug use: No   Sexual activity: Not on file  Other Topics Concern   Not on file  Social History Narrative   Not on file   Social Determinants of Health   Financial Resource Strain: Not on file  Food Insecurity: Not on file  Transportation Needs: Not on file  Physical Activity: Not on file  Stress: Not on file  Social Connections: Not on file  Intimate Partner Violence: Not on file      Family History  Problem Relation Age of Onset   Heart disease Father     Vitals:   05/30/21 1521  BP: 134/89  Pulse: 65  SpO2: 99%  Weight: 97.3 kg (214 lb 9.6 oz)   Wt Readings from Last 3 Encounters:  05/30/21 97.3 kg (214 lb 9.6 oz)  02/02/21 95.6 kg (210 lb 12.8 oz)  07/10/18 100.3 kg (221 lb 3.2 oz)    PHYSICAL EXAM: General:  Well appearing. No resp difficulty HEENT: normal Neck: supple. no JVD. Carotids 2+ bilat; no bruits. No lymphadenopathy or thryomegaly appreciated. Cor: PMI nondisplaced. Regular rate & rhythm. No rubs, gallops or murmurs. Lungs: clear Abdomen: soft, nontender, nondistended. No hepatosplenomegaly. No bruits or masses. Good bowel sounds. Extremities: no cyanosis, clubbing, rash, edema + brace on LLE  + diffuse arthritic changes Neuro: alert & orientedx3, cranial nerves grossly intact. moves all 4 extremities w/o difficulty. Affect pleasant   ASSESSMENT & PLAN:  1. Chronic systolic HF   - cMRI 6/76 EF 36%. EF ~45-50% 08/03/15.  - 03/28/15 LHC normal cors.   - Echo 8/18 EF 50-55% no effusion - Echo  today 05/30/21 EF 40-45% RV normal. Personally reviewed - NYHA I. Volume status stable. Continue current dose of coreg and entresto. Eventually will add SGLT2i once kidney situation ironed out 2. Pericardial effusion, moderate   --no tamponade by RHC 7/16 - resolved on echo today 3. RA   - Per Rheumatology.  4. DM2 with h/o osteomyelitis LLE   5. Chronic LBBB   6. Paroxysmal Atrial fibrillation/flutter  - s/p DC-CV  7/24.  (see ECG from 7/24 and 8/3 for AFL) - CHA2DS2VASC is 3. --30 day event monitor with no AF on amio in 9/16 --Has seen Dr. Caryl Comes in 3/17 and amio stopped --No evidence recurrent AF. - Eliquis stopped with recent hematuria and RCC. Would not restart at this time 7. Renal cell carcinoma - Pending coil ablation 8.. OSA - severe with AHI 76.  - Continue CPAP nightly.     Glori Bickers, MD 4:02 PM

## 2021-05-30 NOTE — Patient Instructions (Signed)
Your physician recommends that you schedule a follow-up appointment in: 6 months, please call our office in March to schedule this

## 2021-06-12 DIAGNOSIS — N401 Enlarged prostate with lower urinary tract symptoms: Secondary | ICD-10-CM | POA: Diagnosis not present

## 2021-06-12 DIAGNOSIS — R310 Gross hematuria: Secondary | ICD-10-CM | POA: Diagnosis not present

## 2021-06-21 DIAGNOSIS — N2889 Other specified disorders of kidney and ureter: Secondary | ICD-10-CM | POA: Diagnosis not present

## 2021-06-21 DIAGNOSIS — Z9889 Other specified postprocedural states: Secondary | ICD-10-CM | POA: Diagnosis not present

## 2021-06-26 DIAGNOSIS — Z23 Encounter for immunization: Secondary | ICD-10-CM | POA: Diagnosis not present

## 2021-07-09 ENCOUNTER — Other Ambulatory Visit (HOSPITAL_COMMUNITY): Payer: Self-pay | Admitting: Internal Medicine

## 2021-07-12 DIAGNOSIS — N401 Enlarged prostate with lower urinary tract symptoms: Secondary | ICD-10-CM | POA: Diagnosis not present

## 2021-07-12 DIAGNOSIS — R310 Gross hematuria: Secondary | ICD-10-CM | POA: Diagnosis not present

## 2021-07-24 DIAGNOSIS — N2889 Other specified disorders of kidney and ureter: Secondary | ICD-10-CM | POA: Diagnosis not present

## 2021-07-24 DIAGNOSIS — Z01812 Encounter for preprocedural laboratory examination: Secondary | ICD-10-CM | POA: Diagnosis not present

## 2021-08-01 DIAGNOSIS — Z7984 Long term (current) use of oral hypoglycemic drugs: Secondary | ICD-10-CM | POA: Diagnosis not present

## 2021-08-01 DIAGNOSIS — I5020 Unspecified systolic (congestive) heart failure: Secondary | ICD-10-CM | POA: Diagnosis not present

## 2021-08-01 DIAGNOSIS — I5042 Chronic combined systolic (congestive) and diastolic (congestive) heart failure: Secondary | ICD-10-CM | POA: Diagnosis not present

## 2021-08-01 DIAGNOSIS — Z905 Acquired absence of kidney: Secondary | ICD-10-CM | POA: Diagnosis not present

## 2021-08-01 DIAGNOSIS — I447 Left bundle-branch block, unspecified: Secondary | ICD-10-CM | POA: Diagnosis not present

## 2021-08-01 DIAGNOSIS — E119 Type 2 diabetes mellitus without complications: Secondary | ICD-10-CM | POA: Diagnosis not present

## 2021-08-01 DIAGNOSIS — M069 Rheumatoid arthritis, unspecified: Secondary | ICD-10-CM | POA: Diagnosis not present

## 2021-08-01 DIAGNOSIS — G4733 Obstructive sleep apnea (adult) (pediatric): Secondary | ICD-10-CM | POA: Diagnosis not present

## 2021-08-01 DIAGNOSIS — N2889 Other specified disorders of kidney and ureter: Secondary | ICD-10-CM | POA: Diagnosis not present

## 2021-08-01 DIAGNOSIS — C641 Malignant neoplasm of right kidney, except renal pelvis: Secondary | ICD-10-CM | POA: Diagnosis not present

## 2021-08-01 DIAGNOSIS — I480 Paroxysmal atrial fibrillation: Secondary | ICD-10-CM | POA: Diagnosis not present

## 2021-08-02 DIAGNOSIS — I447 Left bundle-branch block, unspecified: Secondary | ICD-10-CM | POA: Diagnosis not present

## 2021-08-02 DIAGNOSIS — C641 Malignant neoplasm of right kidney, except renal pelvis: Secondary | ICD-10-CM | POA: Diagnosis not present

## 2021-08-02 DIAGNOSIS — N2889 Other specified disorders of kidney and ureter: Secondary | ICD-10-CM | POA: Diagnosis not present

## 2021-08-02 DIAGNOSIS — G4733 Obstructive sleep apnea (adult) (pediatric): Secondary | ICD-10-CM | POA: Diagnosis not present

## 2021-08-02 DIAGNOSIS — I480 Paroxysmal atrial fibrillation: Secondary | ICD-10-CM | POA: Diagnosis not present

## 2021-08-02 DIAGNOSIS — I5042 Chronic combined systolic (congestive) and diastolic (congestive) heart failure: Secondary | ICD-10-CM | POA: Diagnosis not present

## 2021-08-07 DIAGNOSIS — N2889 Other specified disorders of kidney and ureter: Secondary | ICD-10-CM | POA: Diagnosis not present

## 2021-08-16 DIAGNOSIS — N2889 Other specified disorders of kidney and ureter: Secondary | ICD-10-CM | POA: Diagnosis not present

## 2021-08-16 DIAGNOSIS — C641 Malignant neoplasm of right kidney, except renal pelvis: Secondary | ICD-10-CM | POA: Diagnosis not present

## 2021-09-11 DIAGNOSIS — Z23 Encounter for immunization: Secondary | ICD-10-CM | POA: Diagnosis not present

## 2021-10-02 DIAGNOSIS — N2889 Other specified disorders of kidney and ureter: Secondary | ICD-10-CM | POA: Diagnosis not present

## 2021-10-04 DIAGNOSIS — Z08 Encounter for follow-up examination after completed treatment for malignant neoplasm: Secondary | ICD-10-CM | POA: Diagnosis not present

## 2021-10-04 DIAGNOSIS — N2889 Other specified disorders of kidney and ureter: Secondary | ICD-10-CM | POA: Diagnosis not present

## 2021-10-04 DIAGNOSIS — Z9889 Other specified postprocedural states: Secondary | ICD-10-CM | POA: Diagnosis not present

## 2021-10-04 DIAGNOSIS — C641 Malignant neoplasm of right kidney, except renal pelvis: Secondary | ICD-10-CM | POA: Diagnosis not present

## 2021-10-04 DIAGNOSIS — Z85528 Personal history of other malignant neoplasm of kidney: Secondary | ICD-10-CM | POA: Diagnosis not present

## 2021-10-11 DIAGNOSIS — N2889 Other specified disorders of kidney and ureter: Secondary | ICD-10-CM | POA: Diagnosis not present

## 2021-10-16 DIAGNOSIS — Z66 Do not resuscitate: Secondary | ICD-10-CM | POA: Diagnosis not present

## 2021-10-16 DIAGNOSIS — I110 Hypertensive heart disease with heart failure: Secondary | ICD-10-CM | POA: Diagnosis not present

## 2021-10-16 DIAGNOSIS — Z79899 Other long term (current) drug therapy: Secondary | ICD-10-CM | POA: Diagnosis not present

## 2021-10-16 DIAGNOSIS — N2889 Other specified disorders of kidney and ureter: Secondary | ICD-10-CM | POA: Diagnosis not present

## 2021-10-16 DIAGNOSIS — Z9889 Other specified postprocedural states: Secondary | ICD-10-CM | POA: Diagnosis not present

## 2021-10-16 DIAGNOSIS — I5020 Unspecified systolic (congestive) heart failure: Secondary | ICD-10-CM | POA: Diagnosis not present

## 2021-10-16 DIAGNOSIS — E119 Type 2 diabetes mellitus without complications: Secondary | ICD-10-CM | POA: Diagnosis not present

## 2021-10-16 DIAGNOSIS — Z905 Acquired absence of kidney: Secondary | ICD-10-CM | POA: Diagnosis not present

## 2021-10-16 DIAGNOSIS — M069 Rheumatoid arthritis, unspecified: Secondary | ICD-10-CM | POA: Diagnosis not present

## 2021-10-16 DIAGNOSIS — I480 Paroxysmal atrial fibrillation: Secondary | ICD-10-CM | POA: Diagnosis not present

## 2021-10-16 DIAGNOSIS — I509 Heart failure, unspecified: Secondary | ICD-10-CM | POA: Diagnosis not present

## 2021-10-16 DIAGNOSIS — G4733 Obstructive sleep apnea (adult) (pediatric): Secondary | ICD-10-CM | POA: Diagnosis not present

## 2021-10-16 DIAGNOSIS — Z7984 Long term (current) use of oral hypoglycemic drugs: Secondary | ICD-10-CM | POA: Diagnosis not present

## 2021-10-17 DIAGNOSIS — N2889 Other specified disorders of kidney and ureter: Secondary | ICD-10-CM | POA: Diagnosis not present

## 2021-10-17 DIAGNOSIS — M069 Rheumatoid arthritis, unspecified: Secondary | ICD-10-CM | POA: Diagnosis not present

## 2021-10-17 DIAGNOSIS — E119 Type 2 diabetes mellitus without complications: Secondary | ICD-10-CM | POA: Diagnosis not present

## 2021-10-17 DIAGNOSIS — I110 Hypertensive heart disease with heart failure: Secondary | ICD-10-CM | POA: Diagnosis not present

## 2021-10-17 DIAGNOSIS — Z9889 Other specified postprocedural states: Secondary | ICD-10-CM | POA: Diagnosis not present

## 2021-10-17 DIAGNOSIS — G4733 Obstructive sleep apnea (adult) (pediatric): Secondary | ICD-10-CM | POA: Diagnosis not present

## 2021-10-17 DIAGNOSIS — I509 Heart failure, unspecified: Secondary | ICD-10-CM | POA: Diagnosis not present

## 2021-11-01 DIAGNOSIS — C641 Malignant neoplasm of right kidney, except renal pelvis: Secondary | ICD-10-CM | POA: Diagnosis not present

## 2021-11-01 DIAGNOSIS — Z9889 Other specified postprocedural states: Secondary | ICD-10-CM | POA: Diagnosis not present

## 2021-11-01 DIAGNOSIS — N281 Cyst of kidney, acquired: Secondary | ICD-10-CM | POA: Diagnosis not present

## 2021-11-21 DIAGNOSIS — R5383 Other fatigue: Secondary | ICD-10-CM | POA: Diagnosis not present

## 2021-11-21 DIAGNOSIS — Z111 Encounter for screening for respiratory tuberculosis: Secondary | ICD-10-CM | POA: Diagnosis not present

## 2021-11-21 DIAGNOSIS — Z79899 Other long term (current) drug therapy: Secondary | ICD-10-CM | POA: Diagnosis not present

## 2021-11-21 DIAGNOSIS — M0589 Other rheumatoid arthritis with rheumatoid factor of multiple sites: Secondary | ICD-10-CM | POA: Diagnosis not present

## 2021-12-06 DIAGNOSIS — M810 Age-related osteoporosis without current pathological fracture: Secondary | ICD-10-CM | POA: Diagnosis not present

## 2021-12-06 DIAGNOSIS — M0589 Other rheumatoid arthritis with rheumatoid factor of multiple sites: Secondary | ICD-10-CM | POA: Diagnosis not present

## 2021-12-06 DIAGNOSIS — Z6824 Body mass index (BMI) 24.0-24.9, adult: Secondary | ICD-10-CM | POA: Diagnosis not present

## 2021-12-06 DIAGNOSIS — M25551 Pain in right hip: Secondary | ICD-10-CM | POA: Diagnosis not present

## 2021-12-06 DIAGNOSIS — E7800 Pure hypercholesterolemia, unspecified: Secondary | ICD-10-CM | POA: Diagnosis not present

## 2021-12-06 DIAGNOSIS — I5020 Unspecified systolic (congestive) heart failure: Secondary | ICD-10-CM | POA: Diagnosis not present

## 2021-12-06 DIAGNOSIS — M1991 Primary osteoarthritis, unspecified site: Secondary | ICD-10-CM | POA: Diagnosis not present

## 2022-01-24 DIAGNOSIS — Z85528 Personal history of other malignant neoplasm of kidney: Secondary | ICD-10-CM | POA: Diagnosis not present

## 2022-01-24 DIAGNOSIS — Z9889 Other specified postprocedural states: Secondary | ICD-10-CM | POA: Diagnosis not present

## 2022-01-24 DIAGNOSIS — C641 Malignant neoplasm of right kidney, except renal pelvis: Secondary | ICD-10-CM | POA: Diagnosis not present

## 2022-01-24 DIAGNOSIS — N2889 Other specified disorders of kidney and ureter: Secondary | ICD-10-CM | POA: Diagnosis not present

## 2022-01-31 DIAGNOSIS — N2889 Other specified disorders of kidney and ureter: Secondary | ICD-10-CM | POA: Diagnosis not present

## 2022-01-31 DIAGNOSIS — Z85528 Personal history of other malignant neoplasm of kidney: Secondary | ICD-10-CM | POA: Diagnosis not present

## 2022-01-31 DIAGNOSIS — Z9889 Other specified postprocedural states: Secondary | ICD-10-CM | POA: Diagnosis not present

## 2022-01-31 DIAGNOSIS — Z08 Encounter for follow-up examination after completed treatment for malignant neoplasm: Secondary | ICD-10-CM | POA: Diagnosis not present

## 2022-03-07 ENCOUNTER — Other Ambulatory Visit (HOSPITAL_COMMUNITY): Payer: Self-pay

## 2022-03-07 ENCOUNTER — Other Ambulatory Visit: Payer: Self-pay

## 2022-03-07 MED ORDER — CARVEDILOL 6.25 MG PO TABS: ORAL_TABLET | ORAL | 0 refills | Status: DC

## 2022-03-15 DIAGNOSIS — I5020 Unspecified systolic (congestive) heart failure: Secondary | ICD-10-CM | POA: Diagnosis not present

## 2022-03-15 DIAGNOSIS — Z6824 Body mass index (BMI) 24.0-24.9, adult: Secondary | ICD-10-CM | POA: Diagnosis not present

## 2022-03-15 DIAGNOSIS — M1991 Primary osteoarthritis, unspecified site: Secondary | ICD-10-CM | POA: Diagnosis not present

## 2022-03-15 DIAGNOSIS — M0589 Other rheumatoid arthritis with rheumatoid factor of multiple sites: Secondary | ICD-10-CM | POA: Diagnosis not present

## 2022-03-15 DIAGNOSIS — M810 Age-related osteoporosis without current pathological fracture: Secondary | ICD-10-CM | POA: Diagnosis not present

## 2022-03-15 DIAGNOSIS — M25551 Pain in right hip: Secondary | ICD-10-CM | POA: Diagnosis not present

## 2022-03-15 DIAGNOSIS — E7800 Pure hypercholesterolemia, unspecified: Secondary | ICD-10-CM | POA: Diagnosis not present

## 2022-04-09 ENCOUNTER — Other Ambulatory Visit (HOSPITAL_COMMUNITY): Payer: Self-pay

## 2022-04-09 MED ORDER — CARVEDILOL 6.25 MG PO TABS: 6.2500 mg | ORAL_TABLET | Freq: Two times a day (BID) | ORAL | 0 refills | Status: DC

## 2022-04-13 ENCOUNTER — Other Ambulatory Visit: Payer: Self-pay

## 2022-04-13 NOTE — Patient Outreach (Signed)
  Care Coordination   04/13/2022 Name: Ethan Alexander MRN: [id number] DOB: 10-03-1950   Care Coordination Outreach Attempts:  An unsuccessful telephone outreach was attempted today to offer the patient information about available care coordination services as a benefit of their health plan.   Follow Up Plan:  Additional outreach attempts will be made to offer the patient care coordination information and services.   Encounter Outcome:  No Answer  Care Coordination Interventions Activated:  No   Care Coordination Interventions:  No, not indicated    Tomasa Rand, RN, BSN, CEN [facility] [employer] [phone]

## 2022-04-15 ENCOUNTER — Other Ambulatory Visit (HOSPITAL_COMMUNITY): Payer: Self-pay | Admitting: Internal Medicine

## 2022-04-16 ENCOUNTER — Other Ambulatory Visit: Payer: Self-pay | Admitting: *Deleted

## 2022-04-16 MED ORDER — CARVEDILOL 6.25 MG PO TABS: 6.2500 mg | ORAL_TABLET | Freq: Two times a day (BID) | ORAL | 0 refills | Status: DC

## 2022-04-18 ENCOUNTER — Other Ambulatory Visit: Payer: Self-pay | Admitting: Internal Medicine

## 2022-04-25 DIAGNOSIS — M0579 Rheumatoid arthritis with rheumatoid factor of multiple sites without organ or systems involvement: Secondary | ICD-10-CM | POA: Diagnosis not present

## 2022-04-25 DIAGNOSIS — Z125 Encounter for screening for malignant neoplasm of prostate: Secondary | ICD-10-CM | POA: Diagnosis not present

## 2022-04-25 DIAGNOSIS — F3342 Major depressive disorder, recurrent, in full remission: Secondary | ICD-10-CM | POA: Diagnosis not present

## 2022-04-25 DIAGNOSIS — I700 Atherosclerosis of aorta: Secondary | ICD-10-CM | POA: Diagnosis not present

## 2022-04-25 DIAGNOSIS — G4733 Obstructive sleep apnea (adult) (pediatric): Secondary | ICD-10-CM | POA: Diagnosis not present

## 2022-04-25 DIAGNOSIS — E1169 Type 2 diabetes mellitus with other specified complication: Secondary | ICD-10-CM | POA: Diagnosis not present

## 2022-04-25 DIAGNOSIS — Z0000 Encounter for general adult medical examination without abnormal findings: Secondary | ICD-10-CM | POA: Diagnosis not present

## 2022-04-25 DIAGNOSIS — Z79899 Other long term (current) drug therapy: Secondary | ICD-10-CM | POA: Diagnosis not present

## 2022-04-25 DIAGNOSIS — I5022 Chronic systolic (congestive) heart failure: Secondary | ICD-10-CM | POA: Diagnosis not present

## 2022-04-25 DIAGNOSIS — Z1331 Encounter for screening for depression: Secondary | ICD-10-CM | POA: Diagnosis not present

## 2022-04-25 DIAGNOSIS — E785 Hyperlipidemia, unspecified: Secondary | ICD-10-CM | POA: Diagnosis not present

## 2022-04-25 DIAGNOSIS — Z6825 Body mass index (BMI) 25.0-25.9, adult: Secondary | ICD-10-CM | POA: Diagnosis not present

## 2022-04-30 ENCOUNTER — Other Ambulatory Visit: Payer: Self-pay

## 2022-04-30 NOTE — Patient Outreach (Signed)
  Care Coordination   04/30/2022 Name: Ethan Alexander MRN: [id number] DOB: 1951/06/13   Care Coordination Outreach Attempts:  A second unsuccessful outreach was attempted today to offer the patient with information about available care coordination services as a benefit of their health plan.     Follow Up Plan:  Additional outreach attempts will be made to offer the patient care coordination information and services.   Encounter Outcome:  No Answer  Care Coordination Interventions Activated:  No   Care Coordination Interventions:  No, not indicated    Tomasa Rand, RN, BSN, CEN [facility] [employer] [phone]

## 2022-05-01 ENCOUNTER — Other Ambulatory Visit (HOSPITAL_COMMUNITY): Payer: Self-pay | Admitting: Internal Medicine

## 2022-05-03 DIAGNOSIS — N2889 Other specified disorders of kidney and ureter: Secondary | ICD-10-CM | POA: Diagnosis not present

## 2022-05-09 DIAGNOSIS — Z08 Encounter for follow-up examination after completed treatment for malignant neoplasm: Secondary | ICD-10-CM | POA: Diagnosis not present

## 2022-05-09 DIAGNOSIS — Z85528 Personal history of other malignant neoplasm of kidney: Secondary | ICD-10-CM | POA: Diagnosis not present

## 2022-05-09 DIAGNOSIS — N2889 Other specified disorders of kidney and ureter: Secondary | ICD-10-CM | POA: Diagnosis not present

## 2022-05-09 DIAGNOSIS — Z9889 Other specified postprocedural states: Secondary | ICD-10-CM | POA: Diagnosis not present

## 2022-05-09 DIAGNOSIS — C641 Malignant neoplasm of right kidney, except renal pelvis: Secondary | ICD-10-CM | POA: Diagnosis not present

## 2022-05-23 DIAGNOSIS — N433 Hydrocele, unspecified: Secondary | ICD-10-CM | POA: Diagnosis not present

## 2022-05-23 DIAGNOSIS — N401 Enlarged prostate with lower urinary tract symptoms: Secondary | ICD-10-CM | POA: Diagnosis not present

## 2022-05-23 DIAGNOSIS — R310 Gross hematuria: Secondary | ICD-10-CM | POA: Diagnosis not present

## 2022-06-13 ENCOUNTER — Encounter (HOSPITAL_COMMUNITY): Payer: Medicare Other | Admitting: Internal Medicine

## 2022-07-04 ENCOUNTER — Telehealth: Payer: Self-pay

## 2022-07-04 DIAGNOSIS — K046 Periapical abscess with sinus: Secondary | ICD-10-CM | POA: Diagnosis not present

## 2022-07-04 DIAGNOSIS — K083 Retained dental root: Secondary | ICD-10-CM | POA: Diagnosis not present

## 2022-07-04 DIAGNOSIS — M795 Residual foreign body in soft tissue: Secondary | ICD-10-CM | POA: Diagnosis not present

## 2022-07-04 DIAGNOSIS — K048 Radicular cyst: Secondary | ICD-10-CM | POA: Diagnosis not present

## 2022-07-04 DIAGNOSIS — M272 Inflammatory conditions of jaws: Secondary | ICD-10-CM | POA: Diagnosis not present

## 2022-07-04 NOTE — Patient Outreach (Signed)
  Care Coordination   Initial Visit Note   07/04/2022 Name: EMMA SCHUPP MRN: [id number] DOB: [birth year]  FREDERIK STANDLEY is a 71 y.o. year old male who sees Prochnau, Chrys Racer, MD for primary care. I spoke with  Rico Ala by phone today.  What matters to the patients health and wellness today?  Placed call to patient to review and offer [facility] care coordination program. Patient reports that he is doing well. Reports that he has a pharmacy appointment in December.  Denies needs at this time.    SDOH assessments and interventions completed:  No     Care Coordination Interventions Activated:  No  Care Coordination Interventions:  No, not indicated   Follow up plan: No further intervention required.   Encounter Outcome:  Pt. Refused   Tomasa Rand, RN, BSN, CEN [facility] [employer] [phone]

## 2022-07-09 ENCOUNTER — Other Ambulatory Visit (HOSPITAL_COMMUNITY): Payer: Self-pay | Admitting: Internal Medicine

## 2022-07-19 ENCOUNTER — Encounter (HOSPITAL_COMMUNITY): Payer: Self-pay | Admitting: Internal Medicine

## 2022-07-19 ENCOUNTER — Ambulatory Visit (HOSPITAL_COMMUNITY)
Admission: RE | Admit: 2022-07-19 | Discharge: 2022-07-19 | Disposition: A | Discharge status: Home / Self Care | Payer: Medicare Other | Source: Ambulatory Visit | Attending: Internal Medicine | Admitting: Internal Medicine

## 2022-07-19 VITALS — BP 98/60 | HR 63 | Wt 214.2 lb

## 2022-07-19 DIAGNOSIS — M069 Rheumatoid arthritis, unspecified: Secondary | ICD-10-CM | POA: Diagnosis not present

## 2022-07-19 DIAGNOSIS — Z79899 Other long term (current) drug therapy: Secondary | ICD-10-CM | POA: Diagnosis not present

## 2022-07-19 DIAGNOSIS — Z8249 Family history of ischemic heart disease and other diseases of the circulatory system: Secondary | ICD-10-CM | POA: Diagnosis not present

## 2022-07-19 DIAGNOSIS — I110 Hypertensive heart disease with heart failure: Secondary | ICD-10-CM | POA: Insufficient documentation

## 2022-07-19 DIAGNOSIS — Z7984 Long term (current) use of oral hypoglycemic drugs: Secondary | ICD-10-CM | POA: Insufficient documentation

## 2022-07-19 DIAGNOSIS — I480 Paroxysmal atrial fibrillation: Secondary | ICD-10-CM | POA: Insufficient documentation

## 2022-07-19 DIAGNOSIS — Z87891 Personal history of nicotine dependence: Secondary | ICD-10-CM | POA: Insufficient documentation

## 2022-07-19 DIAGNOSIS — Z85528 Personal history of other malignant neoplasm of kidney: Secondary | ICD-10-CM | POA: Diagnosis not present

## 2022-07-19 DIAGNOSIS — I5022 Chronic systolic (congestive) heart failure: Secondary | ICD-10-CM | POA: Diagnosis not present

## 2022-07-19 DIAGNOSIS — I3139 Other pericardial effusion (noninflammatory): Secondary | ICD-10-CM | POA: Insufficient documentation

## 2022-07-19 DIAGNOSIS — E119 Type 2 diabetes mellitus without complications: Secondary | ICD-10-CM | POA: Insufficient documentation

## 2022-07-19 DIAGNOSIS — G4733 Obstructive sleep apnea (adult) (pediatric): Secondary | ICD-10-CM | POA: Diagnosis not present

## 2022-07-19 DIAGNOSIS — Z7952 Long term (current) use of systemic steroids: Secondary | ICD-10-CM | POA: Diagnosis not present

## 2022-07-19 DIAGNOSIS — I447 Left bundle-branch block, unspecified: Secondary | ICD-10-CM | POA: Diagnosis not present

## 2022-07-19 DIAGNOSIS — I5032 Chronic diastolic (congestive) heart failure: Secondary | ICD-10-CM

## 2022-07-19 NOTE — Progress Notes (Signed)
Patient ID: Ethan Alexander, male   DOB: [birth year], 71 y.o.   MRN: [id number]   Advanced Heart Failure Clinic Note   Primary Care: Trilby Drummer Primary HF Cardiologist: Allis Quirarte Rheumatology: Amil Amen  HPI: Ethan Alexander is a 71 y.o. with hx of recently diagnosed systolic HF (EF 58%), RA (on chronic prednisone - followed by Dr. Ouida Sills), DM2, LBBB admitted with CP and found    Was admitted on 03/26/15 with CP and found to have moderate pericardial effusion, with thickened RV free wall and EF 30%. CT of chest negative for PE. He was taken to cath lab no evidence of tamponade. Normal coronaries.  Developed A fib RVR and was started on amio + cardizem drip.  He was cardioverted and placed in oral amiodarone.     In August 2016 he presented to clinic with AFL but spontaneously converted. Amio increased to 400 bid. Bedside echo 40-45%. Pericardial effusion essentially resolved. Wore 30-day monitor. Amio decreased to 200 bid.  Failed sleep study with AHI 76. Now on CPAP.  S/p pan-metatarsal resection on the left foot at [facility] Had partial R kidney resection in 1/19 and was benign   Today he returns for HF follow up. In 9/22 found to have lesions in both kidneys. Found to have [place] on one side. Underwent coil embolization and freezing. Now doing ok. Says labs look OK now. Started on Farxiga for A1c elevated. Feels good. No CP or SOB. No edema. Rides stationary bike 20-30 min/day or walks on the beach. No recurrent AF.    Echo 05/30/21 EF 40-45% RV normal. Personally reviewed  Cardiac Testing 03/29/2015 cMRI EF 36% mild hyperenhancement suggestive of myocarditis. Moderate pericardial effusion  Bedside echo 8/16: 40-45% pericardial effusion resolved Echo 08/03/15: EF 40-45% in setting of LBBB and septal dyssynchrony. RV normal. No pericardial effusion.  Strain - 17%  Echo 7/17 EF 45-50% Echo 8/18 EF 50-55% no effusion.   [place] 03/25/2015 RA 9   RV 33/6 (21)   PCWP 9   Thermo 5.8/2.5   Fick  4.2/1.8  LHC 03/28/2015--> normal cors   9/16: 30-day event monitor no AFL    Past Medical History:  Diagnosis Date   Arthritis    CHF (congestive heart failure) (HCC)    Diabetes mellitus    diet controlled   Foot drop, left    Hypertension    Osteomyelitis ([place]) 2010   left foot   Osteomyelitis ([place]) 2008/2010   left  foot- metatarsal- per patient was MRSA   Renal disorder    Ruptured lumbar disc    L5-S1   x 2 disc   Umbilical hernia     Current Outpatient Medications  Medication Sig Dispense Refill   Abatacept 125 MG/ML SOSY Inject 125 mg into the skin once a week.      atorvastatin (LIPITOR) 10 MG tablet Take 10 mg by mouth daily.     carvedilol (COREG) 6.25 MG tablet TAKE 1 TABLET(6.25 MG) BY MOUTH TWICE DAILY WITH A MEAL 180 tablet 3   dapagliflozin propanediol (FARXIGA) 10 MG TABS tablet Take 10 mg by mouth daily.     ENTRESTO 24-26 MG TAKE 1 TABLET BY MOUTH TWICE DAILY 60 tablet 11   escitalopram (LEXAPRO) 10 MG tablet Take 10 mg by mouth daily.     metFORMIN (GLUCOPHAGE-XR) 500 MG 24 hr tablet Take 500 mg by mouth daily with breakfast.     predniSONE (DELTASONE) 10 MG tablet Take 1 tablet (10 mg total) by mouth  daily with breakfast. 90 tablet 2   No current facility-administered medications for this encounter.    Allergies  Allergen Reactions   Sulfa Drugs Cross Reactors Other (See Comments)    Flu like symptoms    Tape     Tears his skin   Sulfa Antibiotics Other (See Comments)      Social History   Socioeconomic History   Marital status: Married    Spouse name: Not on file   Number of children: Not on file   Years of education: Not on file   Highest education level: Not on file  Occupational History   Not on file  Tobacco Use   Smoking status: Former    Packs/day: 1.00    Years: 25.00    Total pack years: 25.00    Types: Cigarettes    Quit date: 06/05/2006    Years since quitting: 16.1   Smokeless tobacco: Never  Vaping Use   Vaping  Use: Never used  Substance and Sexual Activity   Alcohol use: Yes    Comment: socially   Drug use: No   Sexual activity: Not on file  Other Topics Concern   Not on file  Social History Narrative   Not on file   Social Determinants of Health   Financial Resource Strain: Not on file  Food Insecurity: Not on file  Transportation Needs: Not on file  Physical Activity: Not on file  Stress: Not on file  Social Connections: Not on file  Intimate Partner Violence: Not on file      Family History  Problem Relation Age of Onset   Heart disease Father     Vitals:   07/19/22 0944  BP: 98/60  Pulse: 63  SpO2: 96%  Weight: 97.2 kg (214 lb 3.2 oz)   Wt Readings from Last 3 Encounters:  07/19/22 97.2 kg (214 lb 3.2 oz)  05/30/21 97.3 kg (214 lb 9.6 oz)  02/02/21 95.6 kg (210 lb 12.8 oz)    PHYSICAL EXAM: General:  Well appearing. No resp difficulty HEENT: normal Neck: supple. no JVD. Carotids 2+ bilat; no bruits. No lymphadenopathy or thryomegaly appreciated. Cor: PMI nondisplaced. Regular rate & rhythm. No rubs, gallops or murmurs. Lungs: clear Abdomen: soft, nontender, nondistended. No hepatosplenomegaly. No bruits or masses. Good bowel sounds. Extremities: no cyanosis, clubbing, rash, edema + brace on LLE  + diffuse arthritic changes Neuro: alert & orientedx3, cranial nerves grossly intact. moves all 4 extremities w/o difficulty. Affect pleasant  SR 70 occ PVCs Personally reviewed  ASSESSMENT & PLAN:  1. Chronic systolic HF   - cMRI 8/29 EF 36%. EF ~45-50% 08/03/15.  - 03/28/15 LHC normal cors.   - Echo 8/18 EF 50-55% no effusion - Echo  05/30/21 EF 40-45% RV normal.  - NYHA I. Volume status stable. Continue current dose of coreg, Entresto and Farxiga - No BP room for spiro 2. Pericardial effusion, moderate   --no tamponade by RHC 7/16 - resolved on echo 9/22 3. RA   - Per Rheumatology.  4. DM2 with h/o osteomyelitis LLE   5. Chronic LBBB   6. Paroxysmal Atrial  fibrillation/flutter  - s/p DC-CV 7/24.  (see ECG from 7/24 and 8/3 for AFL) - CHA2DS2VASC is 3. --30 day event monitor with no AF on amio in 9/16 --Has seen Dr. Caryl Comes in 3/17 and amio stopped --No evidence recurrent AF. - Eliquis stopped with hematuria and RCC. Prefers not to restart as AF has been quiet for so long 7.  Renal cell carcinoma - S/p coil emboliaztion 8. OSA - severe with AHI 76.  - Wears CPAP nightly     Glori Bickers, MD 10:15 AM

## 2022-07-19 NOTE — Patient Instructions (Signed)
Good to see you today!  No changes to your medications  Your physician has requested that you have an echocardiogram. Echocardiography is a painless test that uses sound waves to create images of your heart. It provides your doctor with information about the size and shape of your heart and how well your heart's chambers and valves are working. This procedure takes approximately one hour. There are no restrictions for this procedure. Please do NOT wear cologne, perfume, aftershave, or lotions (deodorant is allowed). Please arrive 15 minutes prior to your appointment time.  Your physician recommends that you schedule a follow-up appointment in: 6 months(May 2024) with echocardiogram. Call office in March 2024 to schedule an appointment  If you have any questions or concerns before your next appointment please send [place] a message through [place] or call our office at [phone].    TO LEAVE A MESSAGE FOR THE NURSE SELECT OPTION 2, PLEASE LEAVE A MESSAGE INCLUDING: YOUR NAME DATE OF BIRTH CALL BACK NUMBER REASON FOR CALL**this is important as we prioritize the call backs  YOU WILL RECEIVE A CALL BACK THE SAME DAY AS LONG AS YOU CALL BEFORE 4:00 PM  At the [facility], you and your health needs are our priority. As part of our continuing mission to provide you with exceptional heart care, we have created designated Provider Care Teams. These Care Teams include your primary Cardiologist (physician) and Advanced Practice Providers (APPs- Physician Assistants and Nurse Practitioners) who all work together to provide you with the care you need, when you need it.   You may see any of the following providers on your designated Care Team at your next follow up: Dr Glori Bickers Dr Loralie Champagne Dr. Roxana Hires, NP Lyda Jester, [place] [facility] [place], [place] Forestine Na, NP Audry Riles, PharmD   Please be sure to bring in all your medications  bottles to every appointment.

## 2022-08-04 ENCOUNTER — Telehealth: Payer: Self-pay | Admitting: Home Health

## 2022-08-04 ENCOUNTER — Other Ambulatory Visit: Payer: Self-pay | Admitting: Home Health

## 2022-08-04 MED ORDER — APIXABAN 5 MG PO TABS: 5.0000 mg | ORAL_TABLET | Freq: Two times a day (BID) | ORAL | 1 refills | Status: DC

## 2022-08-04 NOTE — Telephone Encounter (Signed)
Patient called after hour line, reporting that his heart rate has been irregular, otherwise feel well, is wondering if he is back in atrial fibrillation, wishes to resume his Eliquis that he stopped at one point.  Chart reviewed.  Advised patient okay to resume Eliquis 5 mg twice daily, prescription [facility]  Patient will need to be seen for evaluation of possible recurrence of A-fib, message sent to advanced heart failure team staff for arranging appointment.  Advised the patient go to the ER if he is feeling poor from atrial fibrillation.

## 2022-08-06 ENCOUNTER — Telehealth: Payer: Self-pay | Admitting: *Deleted

## 2022-08-06 NOTE — Telephone Encounter (Signed)
Spoke w/pt, he has started back on eliquis, HR running 60-70s, no sob/edema/cp, f/u appt sch for Fri 12/8 at 8:30

## 2022-08-06 NOTE — Telephone Encounter (Signed)
-----   Message from Margie Billet, NP sent at 08/04/2022 12:48 PM EST ----- Regarding: appt Can you please arrange an appointment for this patient with Dr. Haroldine Laws or APP in 1 week, there is concern of recurrence of A-fib and he decided to go back on Eliquis. Thanks

## 2022-08-10 ENCOUNTER — Inpatient Hospital Stay (HOSPITAL_COMMUNITY)
Admission: RE | Admit: 2022-08-10 | Discharge: 2022-08-10 | Disposition: A | Discharge status: Home / Self Care | Payer: Medicare Other | Source: Ambulatory Visit | Attending: Internal Medicine | Admitting: Internal Medicine

## 2022-08-10 ENCOUNTER — Encounter (HOSPITAL_COMMUNITY): Payer: Self-pay

## 2022-08-10 ENCOUNTER — Ambulatory Visit (HOSPITAL_COMMUNITY)
Admission: RE | Admit: 2022-08-10 | Discharge: 2022-08-10 | Disposition: A | Discharge status: Home / Self Care | Payer: Medicare Other | Source: Ambulatory Visit | Attending: Family Medicine | Admitting: Family Medicine

## 2022-08-10 ENCOUNTER — Other Ambulatory Visit (HOSPITAL_COMMUNITY): Payer: Self-pay | Admitting: Internal Medicine

## 2022-08-10 VITALS — BP 118/86 | HR 65 | Ht 78.0 in | Wt 215.0 lb

## 2022-08-10 DIAGNOSIS — I447 Left bundle-branch block, unspecified: Secondary | ICD-10-CM | POA: Insufficient documentation

## 2022-08-10 DIAGNOSIS — I110 Hypertensive heart disease with heart failure: Secondary | ICD-10-CM | POA: Diagnosis not present

## 2022-08-10 DIAGNOSIS — Z7952 Long term (current) use of systemic steroids: Secondary | ICD-10-CM | POA: Insufficient documentation

## 2022-08-10 DIAGNOSIS — I480 Paroxysmal atrial fibrillation: Secondary | ICD-10-CM | POA: Insufficient documentation

## 2022-08-10 DIAGNOSIS — Z7901 Long term (current) use of anticoagulants: Secondary | ICD-10-CM | POA: Diagnosis not present

## 2022-08-10 DIAGNOSIS — R002 Palpitations: Secondary | ICD-10-CM

## 2022-08-10 DIAGNOSIS — I5022 Chronic systolic (congestive) heart failure: Secondary | ICD-10-CM | POA: Diagnosis not present

## 2022-08-10 DIAGNOSIS — E1169 Type 2 diabetes mellitus with other specified complication: Secondary | ICD-10-CM | POA: Diagnosis not present

## 2022-08-10 DIAGNOSIS — Z79899 Other long term (current) drug therapy: Secondary | ICD-10-CM | POA: Insufficient documentation

## 2022-08-10 DIAGNOSIS — I4892 Unspecified atrial flutter: Secondary | ICD-10-CM | POA: Diagnosis not present

## 2022-08-10 DIAGNOSIS — M069 Rheumatoid arthritis, unspecified: Secondary | ICD-10-CM | POA: Insufficient documentation

## 2022-08-10 DIAGNOSIS — C649 Malignant neoplasm of unspecified kidney, except renal pelvis: Secondary | ICD-10-CM | POA: Insufficient documentation

## 2022-08-10 DIAGNOSIS — G4733 Obstructive sleep apnea (adult) (pediatric): Secondary | ICD-10-CM | POA: Diagnosis not present

## 2022-08-10 DIAGNOSIS — I3139 Other pericardial effusion (noninflammatory): Secondary | ICD-10-CM | POA: Diagnosis not present

## 2022-08-10 LAB — BASIC METABOLIC PANEL
Anion gap: 8 (ref 5–15)
BUN: 22 mg/dL (ref 8–23)
CO2: 23 mmol/L (ref 22–32)
Calcium: 8.9 mg/dL (ref 8.9–10.3)
Chloride: 109 mmol/L (ref 98–111)
Creatinine, Ser: 1.21 mg/dL (ref 0.61–1.24)
GFR, Estimated: >60 mL/min (ref >60)
Glucose, Bld: 115 mg/dL — ABNORMAL HIGH (ref 70–99)
Potassium: 4.4 mmol/L (ref 3.5–5.1)
Sodium: 140 mmol/L (ref 135–145)

## 2022-08-10 LAB — CBC
HCT: 43.5 % (ref 39.0–52.0)
Hemoglobin: 13.7 g/dL (ref 13.0–17.0)
MCH: 30.9 pg (ref 26.0–34.0)
MCHC: 31.5 g/dL (ref 30.0–36.0)
MCV: 98.2 fL (ref 80.0–100.0)
Platelets: 224 10*3/uL (ref 150–400)
RBC: 4.43 MIL/uL (ref 4.22–5.81)
RDW: 14.1 % (ref 11.5–15.5)
WBC: 4 10*3/uL (ref 4.0–10.5)
nRBC: 0 % (ref 0.0–0.2)

## 2022-08-10 LAB — TSH: TSH: 2.752 u[IU]/mL (ref 0.350–4.500)

## 2022-08-10 LAB — MAGNESIUM: Magnesium: 2 mg/dL (ref 1.7–2.4)

## 2022-08-10 NOTE — Patient Instructions (Signed)
EKG done today.   Labs done today. We will contact you only if your labs are abnormal.  No medication changes were made. Please continue all current medications as prescribed.  Your provider has recommended that  you wear a Zio Patch for 14 days.  This monitor will record your heart rhythm for our review.  IF you have any symptoms while wearing the monitor please press the button.  If you have any issues with the patch or you notice a red or orange light on it please call the company at [phone].  Once you remove the patch please mail it back to the company as soon as possible so we can get the results.  Your physician recommends that you schedule a follow-up appointment in: 3 weeks  If you have any questions or concerns before your next appointment please send [place] a message through [place] or call our office at [phone].    TO LEAVE A MESSAGE FOR THE NURSE SELECT OPTION 2, PLEASE LEAVE A MESSAGE INCLUDING: YOUR NAME DATE OF BIRTH CALL BACK NUMBER REASON FOR CALL**this is important as we prioritize the call backs  YOU WILL RECEIVE A CALL BACK THE SAME DAY AS LONG AS YOU CALL BEFORE 4:00 PM   Do the following things EVERYDAY: Weigh yourself in the morning before breakfast. Write it down and keep it in a log. Take your medicines as prescribed Eat low salt foods--Limit salt (sodium) to 2000 mg per day.  Stay as active as you can everyday Limit all fluids for the day to less than 2 liters   At the [facility], you and your health needs are our priority. As part of our continuing mission to provide you with exceptional heart care, we have created designated Provider Care Teams. These Care Teams include your primary Cardiologist (physician) and Advanced Practice Providers (APPs- Physician Assistants and Nurse Practitioners) who all work together to provide you with the care you need, when you need it.   You may see any of the following providers on your designated  Care Team at your next follow up: Dr Glori Bickers Dr Haynes Kerns, NP Lyda Jester, [place] Audry Riles, PharmD   Please be sure to bring in all your medications bottles to every appointment.

## 2022-08-10 NOTE — Addendum Note (Signed)
Encounter addended by: Consuelo Pandy, PA-C on: 08/10/2022 2:59 PM  Actions taken: Clinical Note Signed

## 2022-08-10 NOTE — Progress Notes (Addendum)
Patient ID: Ethan Alexander, male   DOB: 1950/09/23, 71 y.o.   MRN: [id number]   Advanced Heart Failure Clinic Note   Primary Care: Trilby Drummer Primary HF Cardiologist: [place] Rheumatology: Amil Amen EP: Dr. Caryl Comes   Reason for Visit: Work in for palpitations, possible Afib   HPI: Ethan Alexander is a 71 y.o. with hx of recently diagnosed systolic HF (EF 06%), RA (on chronic prednisone - followed by Dr. Ouida Sills), DM2, LBBB and PAF.   Was admitted on 03/26/15 with CP and found to have moderate pericardial effusion, with thickened RV free wall and EF 30%. CT of chest negative for PE. He was taken to cath lab no evidence of tamponade. Normal coronaries.  Developed A fib RVR and was started on amio + cardizem drip.  He was cardioverted and placed in oral amiodarone.     In August 2016 he presented to clinic with AFL but spontaneously converted. Amio increased to 400 bid. Bedside echo 40-45%. Pericardial effusion essentially resolved. Wore 30-day monitor. Amio decreased to 200 bid.  Failed sleep study with AHI 76. Now on CPAP.  S/p pan-metatarsal resection on the left foot at [facility] Had partial R kidney resection in 1/19 and was benign   In 9/22 found to have lesions in both kidneys. Found to have [place] on one side. Underwent coil embolization and freezing.   Echo 05/30/21 EF 40-45% RV normal. Personally reviewed  -30 day event monitor with no AF on amio in 9/16 --Has seen Dr. Caryl Comes in 3/17 and amio stopped --No evidence recurrent AF. - Eliquis stopped with hematuria and RCC.   Presents today as add on. Called after hours line on 12/2 w/ complaints of palpitations. Felt like he was back in Afib. Had not had any detection of afib since ~2016. Off amiodarone. Eliquis had also been discontinued in 2017 given issues w/ hematuria.   Given symptoms of recurrent afib, pt had Rx for Eliquis sent to pharmacy and has been taking. No abnormal bleeding or hematuria. C/w intermittent palpitations.  Denies CP. No dyspnea, syncope or near syncope. Denies recent viral illness's. No vomiting or diarrhea. Denies excessive caffeine or ETOH. Fully compliant w/ CPAP. EKG today shows NSR. HR 65 bpm.     Cardiac Testing 03/29/2015 cMRI EF 36% mild hyperenhancement suggestive of myocarditis. Moderate pericardial effusion  Bedside echo 8/16: 40-45% pericardial effusion resolved Echo 08/03/15: EF 40-45% in setting of LBBB and septal dyssynchrony. RV normal. No pericardial effusion.  Strain - 17%  Echo 7/17 EF 45-50% Echo 8/18 EF 50-55% no effusion.   Gratis 03/25/2015 RA 9   RV 33/6 (21)   PCWP 9   Thermo 5.8/2.5   Fick 4.2/1.8  LHC 03/28/2015--> normal cors   9/16: 30-day event monitor no AFL    Past Medical History:  Diagnosis Date   Arthritis    CHF (congestive heart failure) (HCC)    Diabetes mellitus    diet controlled   Foot drop, left    Hypertension    Osteomyelitis ([place]) 2010   left foot   Osteomyelitis ([place]) 2008/2010   left  foot- metatarsal- per patient was MRSA   Renal disorder    Ruptured lumbar disc    L5-S1   x 2 disc   Umbilical hernia     Current Outpatient Medications  Medication Sig Dispense Refill   Abatacept 125 MG/ML SOSY Inject 125 mg into the skin once a week.      apixaban (ELIQUIS) 5 MG TABS tablet Take  1 tablet (5 mg total) by mouth 2 (two) times daily. 60 tablet 1   atorvastatin (LIPITOR) 10 MG tablet Take 10 mg by mouth daily.     carvedilol (COREG) 6.25 MG tablet TAKE 1 TABLET(6.25 MG) BY MOUTH TWICE DAILY WITH A MEAL 180 tablet 3   dapagliflozin propanediol (FARXIGA) 10 MG TABS tablet Take 10 mg by mouth daily.     ENTRESTO 24-26 MG TAKE 1 TABLET BY MOUTH TWICE DAILY 60 tablet 11   escitalopram (LEXAPRO) 10 MG tablet Take 10 mg by mouth daily.     metFORMIN (GLUCOPHAGE-XR) 500 MG 24 hr tablet Take 500 mg by mouth daily with breakfast.     predniSONE (DELTASONE) 10 MG tablet Take 1 tablet (10 mg total) by mouth daily with breakfast. 90 tablet 2    No current facility-administered medications for this encounter.    Allergies  Allergen Reactions   Sulfa Drugs Cross Reactors Other (See Comments)    Flu like symptoms    Tape     Tears his skin   Sulfa Antibiotics Other (See Comments)      Social History   Socioeconomic History   Marital status: Married    Spouse name: Not on file   Number of children: Not on file   Years of education: Not on file   Highest education level: Not on file  Occupational History   Not on file  Tobacco Use   Smoking status: Former    Packs/day: 1.00    Years: 25.00    Total pack years: 25.00    Types: Cigarettes    Quit date: 06/05/2006    Years since quitting: 16.1   Smokeless tobacco: Never  Vaping Use   Vaping Use: Never used  Substance and Sexual Activity   Alcohol use: Yes    Comment: socially   Drug use: No   Sexual activity: Not on file  Other Topics Concern   Not on file  Social History Narrative   Not on file   Social Determinants of Health   Financial Resource Strain: Not on file  Food Insecurity: Not on file  Transportation Needs: Not on file  Physical Activity: Not on file  Stress: Not on file  Social Connections: Not on file  Intimate Partner Violence: Not on file      Family History  Problem Relation Age of Onset   Heart disease Father     Vitals:   08/10/22 0844  BP: 118/86  Pulse: 65  SpO2: 95%  Weight: 97.5 kg (215 lb)  Height: '6\' 6"'$  (1.981 m)    Wt Readings from Last 3 Encounters:  08/10/22 97.5 kg (215 lb)  07/19/22 97.2 kg (214 lb 3.2 oz)  05/30/21 97.3 kg (214 lb 9.6 oz)   PHYSICAL EXAM: General:  Well appearing. No respiratory difficulty HEENT: normal Neck: supple. no JVD. Carotids 2+ bilat; no bruits. No lymphadenopathy or thyromegaly appreciated. Cor: PMI nondisplaced. Regular rate & rhythm. No rubs, gallops or murmurs. Lungs: clear Abdomen: soft, nontender, nondistended. No hepatosplenomegaly. No bruits or masses. Good bowel  sounds. Extremities: no cyanosis, clubbing, rash, edema Neuro: alert & oriented x 3, cranial nerves grossly intact. moves all 4 extremities w/o difficulty. Affect pleasant.   EKG: NSR 65 bpm Personally reviewed  ASSESSMENT & PLAN:  1. Palpitations - h/o Afib - intermitted x several wks. EKG today NSR, HR 60s - place 2 wk Zio - check BMP, Mg, TSH and CBC  - continue ? blocker - limit  triggers. Discussed caffeine and ETOH   2. Chronic systolic HF   - cMRI 2/95 EF 36%. EF ~45-50% 08/03/15.  - 03/28/15 LHC normal cors.   - Echo 8/18 EF 50-55% no effusion - Echo  05/30/21 EF 40-45% RV normal.  - NYHA I. Volume status stable. Euvolemic on exam - Continue Entresto 24-26 mg bid  - Continue Farxiga 10 mg  - Continue Coreg 6.25 mg bid  - No BP room for spiro  3. Pericardial effusion, moderate   --no tamponade by RHC 7/16 - resolved on echo 9/22  4. RA   - Per Rheumatology.   5. DM2 with h/o osteomyelitis LLE   - followed by PCP  - on Farxiga   6. Chronic LBBB   - EF out of range for CRT-D last echo   7. Paroxysmal Atrial fibrillation/flutter  - s/p DC-CV 7/24 - CHA2DS2VASC is 3. - 30 day event monitor with no AF on amio in 9/16 - Has seen Dr. Caryl Comes in 3/17 and amio stopped - recent symptoms of possible PAF. NSR on EKG today  - place 2 wk Zio. If detection of recurrence, can consider AAD therapy vs referral for ablation  - continue Eliquis 5 mg bid - encouraged to continue strict compliance w/ CPAP - continue Coreg   7. Renal cell carcinoma - S/p coil embolization  8. OSA - severe with AHI 76.  - Wears CPAP nightly   Plan 2 wk Zio. F/u in 3 wks, after Elwyn Reach is completed.     Lyda Jester, PA-C  2:55 PM

## 2022-08-20 DIAGNOSIS — E1169 Type 2 diabetes mellitus with other specified complication: Secondary | ICD-10-CM | POA: Diagnosis not present

## 2022-08-29 DIAGNOSIS — R002 Palpitations: Secondary | ICD-10-CM | POA: Diagnosis not present

## 2022-08-29 NOTE — Addendum Note (Signed)
Encounter addended by: Micki Riley, RN on: 08/29/2022 4:45 PM  Actions taken: Imaging Exam ended

## 2022-08-30 NOTE — Progress Notes (Signed)
Patient ID: Ethan Alexander, male   DOB: 01-Feb-1951, 71 y.o.   MRN: [id number]   Advanced Heart Failure Clinic Note   Primary Care: Trilby Drummer Primary HF Cardiologist: [place] Rheumatology: Amil Amen EP: Dr. Caryl Comes   HPI: Ethan Alexander is a 71 y.o. with hx of systolic HF (EF 26%), RA (on chronic prednisone - followed by Dr. Ouida Sills), DM2, LBBB and PAF.   Was admitted on 03/26/15 with CP and found to have moderate pericardial effusion, with thickened RV free wall and EF 30%. CT of chest negative for PE. He was taken to cath lab no evidence of tamponade. Normal coronaries.  Developed A fib RVR and was started on amio + cardizem drip.  He was cardioverted and placed in oral amiodarone.     In August 2016 he presented to clinic with AFL but spontaneously converted. Amio increased to 400 bid. Bedside echo 40-45%. Pericardial effusion essentially resolved. Wore 30-day monitor. Amio decreased to 200 bid.  Failed sleep study with AHI 76. Now on CPAP.  S/p pan-metatarsal resection on the left foot at [facility] Had partial R kidney resection in 1/19 and was benign   In 9/22 found to have lesions in both kidneys. Found to have [place] on one side. Underwent coil embolization and freezing.   Echo 05/30/21 EF 40-45% RV normal. Personally reviewed  -30 day event monitor with no AF on amio in 9/16 --Has seen Dr. Caryl Comes in 3/17 and amio stopped --No evidence recurrent AF. - Eliquis stopped with hematuria and RCC.   He was seen a few week ago in the HF clinic for an acute visit due to palpitations. Zio placed for 2 weeks. No A fib . 6.4 % PVCs noted. He was set up for ECHO.    Today he returns for HF follow up. Overall feeling much better. He reports less frequent palpitations. Says palpitations are now rare. Denies SOB/PND/Orthopnea. Riding stationary bike 15-20 minutes per day. SBP 90-110. Appetite ok. No fever or chills. Weight at home stable.  Taking all medications.   Cardiac Testing 03/29/2015 cMRI  EF 36% mild hyperenhancement suggestive of myocarditis. Moderate pericardial effusion  Bedside echo 8/16: 40-45% pericardial effusion resolved Echo 08/03/15: EF 40-45% in setting of LBBB and septal dyssynchrony. RV normal. No pericardial effusion.  Strain - 17%  Echo 7/17 EF 45-50% Echo 8/18 EF 50-55% no effusion.  Echo 2022 EF 40-45% RV normal.  RHC 03/25/2015 RA 9   RV 33/6 (21)   PCWP 9   Thermo 5.8/2.5   Fick 4.2/1.8  LHC 03/28/2015--> normal cors   9/16: 30-day event monitor no AFL    Past Medical History:  Diagnosis Date   Arthritis    CHF (congestive heart failure) (HCC)    Diabetes mellitus    diet controlled   Foot drop, left    Hypertension    Osteomyelitis ([place]) 2010   left foot   Osteomyelitis ([place]) 2008/2010   left  foot- metatarsal- per patient was MRSA   Renal disorder    Ruptured lumbar disc    L5-S1   x 2 disc   Umbilical hernia     Current Outpatient Medications  Medication Sig Dispense Refill   Abatacept 125 MG/ML SOSY Inject 125 mg into the skin once a week.      apixaban (ELIQUIS) 5 MG TABS tablet Take 1 tablet (5 mg total) by mouth 2 (two) times daily. 60 tablet 1   atorvastatin (LIPITOR) 10 MG tablet Take 10 mg by mouth  daily.     carvedilol (COREG) 6.25 MG tablet TAKE 1 TABLET(6.25 MG) BY MOUTH TWICE DAILY WITH A MEAL 180 tablet 3   dapagliflozin propanediol (FARXIGA) 10 MG TABS tablet Take 10 mg by mouth daily.     ENTRESTO 24-26 MG TAKE 1 TABLET BY MOUTH TWICE DAILY 60 tablet 11   escitalopram (LEXAPRO) 10 MG tablet Take 10 mg by mouth daily.     metFORMIN (GLUCOPHAGE-XR) 500 MG 24 hr tablet Take 500 mg by mouth daily with breakfast.     predniSONE (DELTASONE) 10 MG tablet Take 1 tablet (10 mg total) by mouth daily with breakfast. 90 tablet 2   No current facility-administered medications for this encounter.    Allergies  Allergen Reactions   Sulfa Drugs Cross Reactors Other (See Comments)    Flu like symptoms    Tape     Tears his  skin   Sulfa Antibiotics Other (See Comments)      Social History   Socioeconomic History   Marital status: Married    Spouse name: Not on file   Number of children: Not on file   Years of education: Not on file   Highest education level: Not on file  Occupational History   Not on file  Tobacco Use   Smoking status: Former    Packs/day: 1.00    Years: 25.00    Total pack years: 25.00    Types: Cigarettes    Quit date: 06/05/2006    Years since quitting: 16.2   Smokeless tobacco: Never  Vaping Use   Vaping Use: Never used  Substance and Sexual Activity   Alcohol use: Yes    Comment: socially   Drug use: No   Sexual activity: Not on file  Other Topics Concern   Not on file  Social History Narrative   Not on file   Social Determinants of Health   Financial Resource Strain: Not on file  Food Insecurity: Not on file  Transportation Needs: Not on file  Physical Activity: Not on file  Stress: Not on file  Social Connections: Not on file  Intimate Partner Violence: Not on file      Family History  Problem Relation Age of Onset   Heart disease Father     Vitals:   08/31/22 0832  BP: 110/70  Pulse: 72  SpO2: 95%  Weight: 98 kg (216 lb)     Wt Readings from Last 3 Encounters:  08/31/22 98 kg (216 lb)  08/10/22 97.5 kg (215 lb)  07/19/22 97.2 kg (214 lb 3.2 oz)   PHYSICAL EXAM: General:  Well appearing. No resp difficulty HEENT: normal Neck: supple. no JVD. Carotids 2+ bilat; no bruits. No lymphadenopathy or thryomegaly appreciated. Cor: PMI nondisplaced. Regular rate & rhythm. No rubs, gallops or murmurs. Lungs: clear Abdomen: soft, nontender, nondistended. No hepatosplenomegaly. No bruits or masses. Good bowel sounds. Extremities: no cyanosis, clubbing, rash, edema Neuro: alert & orientedx3, cranial nerves grossly intact. moves all 4 extremities w/o difficulty. Affect pleasant  EKG: SR 75 bpm QRS  138 ms   ASSESSMENT & PLAN:  1. Palpitations -  Had A fib years ago.  - Zio - No A fib, 6.4% PVCs.  - Now with rare palpitations. EKG today no PVCs.  - Continue coreg 6.25 mg in am and increased bedtime coreg to 9.375 mcg .  - He will continue to monitor BP at home.  - Discussed limiting caffeine.   2. Chronic systolic HF   -  cMRI 7/16 EF 36%. EF ~45-50% 08/03/15.  - 03/28/15 LHC normal cors.   - Echo 8/18 EF 50-55% no effusion - Echo  05/30/21 EF 40-45% RV normal.  - NYHA I. Voluem status stable.  - Continue Entresto 24-26 mg bid  - Continue Farxiga 10 mg  - Continue Coreg 6.25 mg in am and increase evening dose to 9.375 mg as noted above.  - hold off on spironolactone with soft BP.  - Schedule ECHO.    3. Pericardial effusion, moderate   --no tamponade by RHC 7/16 - resolved on echo 9/22  4. RA   - Per Rheumatology.   5. DM2 with h/o osteomyelitis LLE   - followed by PCP  - on Farxiga   6. Chronic LBBB   - EF out of range for CRT-D last echo   7. Paroxysmal Atrial fibrillation/flutter  - s/p DC-CV 7/24 - CHA2DS2VASC is 3. - 30 day event monitor with no AF on amio in 9/16 - Has seen Dr. Caryl Comes in 3/17 and amio stopped - 12/27 Zio. No A fib.  - continue Eliquis 5 mg bid - encouraged to continue strict compliance w/ CPAP - continue Coreg  7. Renal cell carcinoma - S/p coil embolization  8. OSA - severe with AHI 76.  - Wears CPAP nightly    Follow up in 3 months with Dr Donnamae Jude, NP-C  8:42 AM

## 2022-08-31 ENCOUNTER — Ambulatory Visit (HOSPITAL_COMMUNITY)
Admission: RE | Admit: 2022-08-31 | Discharge: 2022-08-31 | Disposition: A | Discharge status: Home / Self Care | Payer: Medicare Other | Source: Ambulatory Visit | Attending: Adult Health | Admitting: Adult Health

## 2022-08-31 VITALS — BP 110/70 | HR 72 | Wt 216.0 lb

## 2022-08-31 DIAGNOSIS — E1169 Type 2 diabetes mellitus with other specified complication: Secondary | ICD-10-CM | POA: Diagnosis not present

## 2022-08-31 DIAGNOSIS — R002 Palpitations: Secondary | ICD-10-CM | POA: Diagnosis not present

## 2022-08-31 DIAGNOSIS — Z7984 Long term (current) use of oral hypoglycemic drugs: Secondary | ICD-10-CM | POA: Diagnosis not present

## 2022-08-31 DIAGNOSIS — I5032 Chronic diastolic (congestive) heart failure: Secondary | ICD-10-CM | POA: Diagnosis not present

## 2022-08-31 DIAGNOSIS — I447 Left bundle-branch block, unspecified: Secondary | ICD-10-CM | POA: Insufficient documentation

## 2022-08-31 DIAGNOSIS — Z79899 Other long term (current) drug therapy: Secondary | ICD-10-CM | POA: Diagnosis not present

## 2022-08-31 DIAGNOSIS — Z7901 Long term (current) use of anticoagulants: Secondary | ICD-10-CM | POA: Diagnosis not present

## 2022-08-31 DIAGNOSIS — C649 Malignant neoplasm of unspecified kidney, except renal pelvis: Secondary | ICD-10-CM | POA: Diagnosis not present

## 2022-08-31 DIAGNOSIS — I3139 Other pericardial effusion (noninflammatory): Secondary | ICD-10-CM | POA: Insufficient documentation

## 2022-08-31 DIAGNOSIS — Z23 Encounter for immunization: Secondary | ICD-10-CM | POA: Diagnosis not present

## 2022-08-31 DIAGNOSIS — I480 Paroxysmal atrial fibrillation: Secondary | ICD-10-CM | POA: Insufficient documentation

## 2022-08-31 DIAGNOSIS — I5022 Chronic systolic (congestive) heart failure: Secondary | ICD-10-CM | POA: Insufficient documentation

## 2022-08-31 DIAGNOSIS — I4892 Unspecified atrial flutter: Secondary | ICD-10-CM | POA: Insufficient documentation

## 2022-08-31 DIAGNOSIS — G4733 Obstructive sleep apnea (adult) (pediatric): Secondary | ICD-10-CM | POA: Diagnosis not present

## 2022-08-31 DIAGNOSIS — I110 Hypertensive heart disease with heart failure: Secondary | ICD-10-CM | POA: Diagnosis not present

## 2022-08-31 DIAGNOSIS — M069 Rheumatoid arthritis, unspecified: Secondary | ICD-10-CM | POA: Diagnosis not present

## 2022-08-31 MED ORDER — CARVEDILOL 6.25 MG PO TABS: ORAL_TABLET | ORAL | 3 refills | Status: DC

## 2022-08-31 NOTE — Patient Instructions (Signed)
CHANGE Carvedilol to 6.'25mg'$  in the morning and 9.'375mg'$  in the evening   Your physician has requested that you have an echocardiogram. Echocardiography is a painless test that uses sound waves to create images of your heart. It provides your doctor with information about the size and shape of your heart and how well your heart's chambers and valves are working. This procedure takes approximately one hour. There are no restrictions for this procedure. Please do NOT wear cologne, perfume, aftershave, or lotions (deodorant is allowed). Please arrive 15 minutes prior to your appointment time.  Your physician recommends that you schedule a follow-up appointment in: 3 months  If you have any questions or concerns before your next appointment please send [place] a message through [place] or call our office at [phone].    TO LEAVE A MESSAGE FOR THE NURSE SELECT OPTION 2, PLEASE LEAVE A MESSAGE INCLUDING: YOUR NAME DATE OF BIRTH CALL BACK NUMBER REASON FOR CALL**this is important as we prioritize the call backs  YOU WILL RECEIVE A CALL BACK THE SAME DAY AS LONG AS YOU CALL BEFORE 4:00 PM  At the [facility], you and your health needs are our priority. As part of our continuing mission to provide you with exceptional heart care, we have created designated Provider Care Teams. These Care Teams include your primary Cardiologist (physician) and Advanced Practice Providers (APPs- Physician Assistants and Nurse Practitioners) who all work together to provide you with the care you need, when you need it.   You may see any of the following providers on your designated Care Team at your next follow up: Dr Glori Bickers Dr Loralie Champagne Dr. Roxana Hires, NP Lyda Jester, [place] [facility], [place] Forestine Na, NP Audry Riles, PharmD   Please be sure to bring in all your medications bottles to every appointment.

## 2022-09-19 DIAGNOSIS — M0589 Other rheumatoid arthritis with rheumatoid factor of multiple sites: Secondary | ICD-10-CM | POA: Diagnosis not present

## 2022-09-19 DIAGNOSIS — E7800 Pure hypercholesterolemia, unspecified: Secondary | ICD-10-CM | POA: Diagnosis not present

## 2022-09-19 DIAGNOSIS — M810 Age-related osteoporosis without current pathological fracture: Secondary | ICD-10-CM | POA: Diagnosis not present

## 2022-09-19 DIAGNOSIS — I5020 Unspecified systolic (congestive) heart failure: Secondary | ICD-10-CM | POA: Diagnosis not present

## 2022-09-19 DIAGNOSIS — Z6825 Body mass index (BMI) 25.0-25.9, adult: Secondary | ICD-10-CM | POA: Diagnosis not present

## 2022-09-19 DIAGNOSIS — E663 Overweight: Secondary | ICD-10-CM | POA: Diagnosis not present

## 2022-09-19 DIAGNOSIS — M25551 Pain in right hip: Secondary | ICD-10-CM | POA: Diagnosis not present

## 2022-09-19 DIAGNOSIS — M1991 Primary osteoarthritis, unspecified site: Secondary | ICD-10-CM | POA: Diagnosis not present

## 2022-09-26 DIAGNOSIS — B029 Zoster without complications: Secondary | ICD-10-CM | POA: Diagnosis not present

## 2022-10-01 ENCOUNTER — Other Ambulatory Visit: Payer: Self-pay | Admitting: Home Health

## 2022-10-01 ENCOUNTER — Ambulatory Visit (HOSPITAL_COMMUNITY): Admission: RE | Admit: 2022-10-01 | Payer: Medicare Other | Source: Ambulatory Visit

## 2022-10-01 NOTE — Telephone Encounter (Signed)
Prescription refill request for Eliquis received. Indication:afib Last office visit:11/23 Scr:1.2 Age: 72 Weight:98  kg  Prescription refilled

## 2022-10-22 ENCOUNTER — Ambulatory Visit (HOSPITAL_COMMUNITY)
Admission: RE | Admit: 2022-10-22 | Discharge: 2022-10-22 | Disposition: A | Discharge status: Home / Self Care | Payer: Medicare Other | Source: Ambulatory Visit | Attending: Internal Medicine | Admitting: Internal Medicine

## 2022-10-22 DIAGNOSIS — I5032 Chronic diastolic (congestive) heart failure: Secondary | ICD-10-CM | POA: Insufficient documentation

## 2022-10-22 DIAGNOSIS — E119 Type 2 diabetes mellitus without complications: Secondary | ICD-10-CM | POA: Diagnosis not present

## 2022-10-22 DIAGNOSIS — I110 Hypertensive heart disease with heart failure: Secondary | ICD-10-CM | POA: Insufficient documentation

## 2022-10-22 LAB — ECHOCARDIOGRAM COMPLETE
Area-P 1/2: 1.63 cm2
Calc EF: 47.3 %
S' Lateral: 3.3 cm
Single Plane A2C EF: 50.1 %
Single Plane A4C EF: 44.1 %

## 2022-10-22 NOTE — Progress Notes (Signed)
Echocardiogram 2D Echocardiogram has been performed.  [name] 10/22/2022, 3:51 PM

## 2022-11-08 DIAGNOSIS — N2889 Other specified disorders of kidney and ureter: Secondary | ICD-10-CM | POA: Diagnosis not present

## 2022-11-08 DIAGNOSIS — C641 Malignant neoplasm of right kidney, except renal pelvis: Secondary | ICD-10-CM | POA: Diagnosis not present

## 2022-11-08 DIAGNOSIS — Z9889 Other specified postprocedural states: Secondary | ICD-10-CM | POA: Diagnosis not present

## 2022-11-14 DIAGNOSIS — C641 Malignant neoplasm of right kidney, except renal pelvis: Secondary | ICD-10-CM | POA: Diagnosis not present

## 2022-11-14 DIAGNOSIS — N2889 Other specified disorders of kidney and ureter: Secondary | ICD-10-CM | POA: Diagnosis not present

## 2022-11-30 ENCOUNTER — Ambulatory Visit (HOSPITAL_COMMUNITY)
Admission: RE | Admit: 2022-11-30 | Discharge: 2022-11-30 | Disposition: A | Discharge status: Home / Self Care | Payer: Medicare Other | Source: Ambulatory Visit | Attending: Internal Medicine | Admitting: Internal Medicine

## 2022-11-30 ENCOUNTER — Encounter (HOSPITAL_COMMUNITY): Payer: Self-pay | Admitting: Internal Medicine

## 2022-11-30 VITALS — BP 124/84 | HR 65 | Wt 213.8 lb

## 2022-11-30 DIAGNOSIS — I3139 Other pericardial effusion (noninflammatory): Secondary | ICD-10-CM | POA: Insufficient documentation

## 2022-11-30 DIAGNOSIS — Z7901 Long term (current) use of anticoagulants: Secondary | ICD-10-CM | POA: Diagnosis not present

## 2022-11-30 DIAGNOSIS — Z79899 Other long term (current) drug therapy: Secondary | ICD-10-CM | POA: Insufficient documentation

## 2022-11-30 DIAGNOSIS — Z7984 Long term (current) use of oral hypoglycemic drugs: Secondary | ICD-10-CM | POA: Insufficient documentation

## 2022-11-30 DIAGNOSIS — I110 Hypertensive heart disease with heart failure: Secondary | ICD-10-CM | POA: Insufficient documentation

## 2022-11-30 DIAGNOSIS — M069 Rheumatoid arthritis, unspecified: Secondary | ICD-10-CM | POA: Insufficient documentation

## 2022-11-30 DIAGNOSIS — R002 Palpitations: Secondary | ICD-10-CM | POA: Diagnosis not present

## 2022-11-30 DIAGNOSIS — Z7952 Long term (current) use of systemic steroids: Secondary | ICD-10-CM | POA: Diagnosis not present

## 2022-11-30 DIAGNOSIS — I480 Paroxysmal atrial fibrillation: Secondary | ICD-10-CM | POA: Diagnosis not present

## 2022-11-30 DIAGNOSIS — Z8249 Family history of ischemic heart disease and other diseases of the circulatory system: Secondary | ICD-10-CM | POA: Insufficient documentation

## 2022-11-30 DIAGNOSIS — I4892 Unspecified atrial flutter: Secondary | ICD-10-CM | POA: Insufficient documentation

## 2022-11-30 DIAGNOSIS — I5022 Chronic systolic (congestive) heart failure: Secondary | ICD-10-CM | POA: Insufficient documentation

## 2022-11-30 DIAGNOSIS — C649 Malignant neoplasm of unspecified kidney, except renal pelvis: Secondary | ICD-10-CM | POA: Diagnosis not present

## 2022-11-30 DIAGNOSIS — I447 Left bundle-branch block, unspecified: Secondary | ICD-10-CM | POA: Diagnosis not present

## 2022-11-30 DIAGNOSIS — G4733 Obstructive sleep apnea (adult) (pediatric): Secondary | ICD-10-CM | POA: Insufficient documentation

## 2022-11-30 DIAGNOSIS — Z87891 Personal history of nicotine dependence: Secondary | ICD-10-CM | POA: Diagnosis not present

## 2022-11-30 DIAGNOSIS — E119 Type 2 diabetes mellitus without complications: Secondary | ICD-10-CM | POA: Insufficient documentation

## 2022-11-30 NOTE — Progress Notes (Signed)
Patient ID: Ethan Alexander, male   DOB: 03-20-1951, 72 y.o.   MRN: [id number]   Advanced Heart Failure Clinic Note   Primary Care: Trilby Drummer Primary HF Cardiologist: [place] [facility]: Amil Amen EP: Dr. Caryl Comes   HPI: Ethan Alexander is a 72 y.o. with hx of systolic HF (EF [id number]), RA (on chronic prednisone - followed by Dr. Ouida Sills), DM2, LBBB and PAF.   Was admitted on 03/26/15 with CP and found to have moderate pericardial effusion, with thickened RV free wall and EF 30%. CT of chest negative for PE. He was taken to cath lab no evidence of tamponade. Normal coronaries.  Developed A fib RVR and was started on amio + cardizem drip.  He was cardioverted and placed in oral amiodarone.     In August 2016 he presented to clinic with AFL but spontaneously converted. Amio increased to 400 bid. Bedside echo 40-45%. Pericardial effusion essentially resolved. Wore 30-day monitor. Amio decreased to 200 bid.  Failed sleep study with AHI 76. Now on CPAP.  S/p pan-metatarsal resection on the left foot at [facility] Had partial R kidney resection in 1/19 and was benign   In 9/22 found to have lesions in both kidneys. Found to have [place] on one side. Underwent coil embolization and freezing.   Echo 05/30/21 EF 40-45% RV normal. Personally reviewed  -30 day event monitor with no AF on amio in 9/16 --Has seen Dr. Caryl Comes in 3/17 and amio stopped --No evidence recurrent AF. - Eliquis stopped with hematuria and RCC.   He was seen a few week ago in the HF clinic for an acute visit due to palpitations. Zio placed for 2 weeks. No A fib. 6.4 % PVCs noted. He was set up for ECHO.    Echo 2//24 EF 45-50%  Today he returns for HF follow up. Feeling pretty good.     Cardiac Testing 03/29/2015 cMRI EF 36% mild hyperenhancement suggestive of myocarditis. Moderate pericardial effusion  Bedside echo 8/16: 40-45% pericardial effusion resolved Echo 08/03/15: EF 40-45% in setting of LBBB and septal dyssynchrony. RV  normal. No pericardial effusion.  Strain - 17%  Echo 7/17 EF 45-50% Echo 8/18 EF 50-55% no effusion.  Echo 2022 EF 40-45% RV normal.  RHC 03/25/2015 RA 9   RV 33/6 (21)   PCWP 9   Thermo 5.8/2.5   Fick 4.2/1.8  LHC 03/28/2015--> normal cors   9/16: 30-day event monitor no AFL    Past Medical History:  Diagnosis Date   Arthritis    CHF (congestive heart failure) (HCC)    Diabetes mellitus    diet controlled   Foot drop, left    Hypertension    Osteomyelitis ([place]) 2010   left foot   Osteomyelitis ([place]) 2008/2010   left  foot- metatarsal- per patient was MRSA   Renal disorder    Ruptured lumbar disc    L5-S1   x 2 disc   Umbilical hernia     Current Outpatient Medications  Medication Sig Dispense Refill   Abatacept 125 MG/ML SOSY Inject 125 mg into the skin once a week.      apixaban (ELIQUIS) 5 MG TABS tablet TAKE 1 TABLET BY MOUTH TWICE A DAY 60 tablet 5   atorvastatin (LIPITOR) 10 MG tablet Take 10 mg by mouth daily.     carvedilol (COREG) 6.25 MG tablet Take 6.25 mg by mouth in the morning. 12.5 mg  in the PM     dapagliflozin propanediol (FARXIGA) 10 MG TABS  tablet Take 10 mg by mouth daily.     ENTRESTO 24-26 MG TAKE 1 TABLET BY MOUTH TWICE DAILY 60 tablet 11   escitalopram (LEXAPRO) 10 MG tablet Take 10 mg by mouth daily.     metFORMIN (GLUCOPHAGE-XR) 500 MG 24 hr tablet Take 500 mg by mouth daily with breakfast.     predniSONE (DELTASONE) 10 MG tablet Take 1 tablet (10 mg total) by mouth daily with breakfast. 90 tablet 2   No current facility-administered medications for this encounter.    Allergies  Allergen Reactions   Sulfa Drugs Cross Reactors Other (See Comments)    Flu like symptoms    Tape     Tears his skin   Sulfa Antibiotics Other (See Comments)      Social History   Socioeconomic History   Marital status: Married    Spouse name: Not on file   Number of children: Not on file   Years of education: Not on file   Highest education level:  Not on file  Occupational History   Not on file  Tobacco Use   Smoking status: Former    Packs/day: 1.00    Years: 25.00    Additional pack years: 0.00    Total pack years: 25.00    Types: Cigarettes    Quit date: 06/05/2006    Years since quitting: 16.4   Smokeless tobacco: Never  Vaping Use   Vaping Use: Never used  Substance and Sexual Activity   Alcohol use: Yes    Comment: socially   Drug use: No   Sexual activity: Not on file  Other Topics Concern   Not on file  Social History Narrative   Not on file   Social Determinants of Health   Financial Resource Strain: Not on file  Food Insecurity: Not on file  Transportation Needs: Not on file  Physical Activity: Not on file  Stress: Not on file  Social Connections: Not on file  Intimate Partner Violence: Not on file      Family History  Problem Relation Age of Onset   Heart disease Father     Vitals:   11/30/22 1057  BP: 124/84  Pulse: 65  SpO2: 96%  Weight: 97 kg (213 lb 12.8 oz)     Wt Readings from Last 3 Encounters:  11/30/22 97 kg (213 lb 12.8 oz)  08/31/22 98 kg (216 lb)  08/10/22 97.5 kg (215 lb)   PHYSICAL EXAM: General:  Well appearing. No resp difficulty HEENT: normal Neck: supple. no JVD. Carotids 2+ bilat; no bruits. No lymphadenopathy or thryomegaly appreciated. Cor: PMI nondisplaced. Regular rate & rhythm. No rubs, gallops or murmurs. Lungs: clear Abdomen: soft, nontender, nondistended. No hepatosplenomegaly. No bruits or masses. Good bowel sounds. Extremities: no cyanosis, clubbing, rash, edema + arthritis cahnges Neuro: alert & orientedx3, cranial nerves grossly intact. moves all 4 extremities w/o difficulty. Affect pleasant    ASSESSMENT & PLAN:  1. Palpitations - Had A fib years ago.  - Zio 12/23 - No A fib, 6.4% PVCs.  - At last visit carvedilol increased 6.25 bid to 6.25/9.375 bid. Unable to cut pills in half so taking 6.25/12.5  - This helped  2. Chronic systolic HF   -  cMRI [id number] EF 36%. EF ~45-50% 08/03/15.  - 03/28/15 LHC normal cors.   - Echo 8/18 EF 50-55% no effusion - Echo  05/30/21 EF 40-45% RV normal.  - Echo 2//24 EF 45-50% - Doing well NYHA I - Continue Entresto  24-26 mg bid  - Continue Farxiga 10 mg  - Continue Coreg 6.25/12.5 - No spironolactone with soft BP.   3. Pericardial effusion, moderate   --no tamponade by RHC 7/16 - resolved  4. RA   - Per Rheumatology.   5. DM2 with h/o osteomyelitis LLE   - followed by PCP  - Continue Farxiga   6. Chronic LBBB   - EF out of range for CRT-D  7. Paroxysmal Atrial fibrillation/flutter  - s/p DC-CV 7/24 - CHA2DS2VASC is 3. - 30 day event monitor with no AF on amio in 9/16 - Has seen Dr. Caryl Comes in 3/17 and amio stopped - 12/27 Zio. No A fib.  - continue Eliquis 5 mg bid - encouraged to continue strict compliance w/ CPAP - continue Coreg  7. Renal cell carcinoma - S/p coil embolization  8. OSA - severe with AHI 76.  - Wears CPAP nightly    Glori Bickers, MD 11:51 AM

## 2022-11-30 NOTE — Patient Instructions (Signed)
There has been no changes to your medications.  Your physician recommends that you schedule a follow-up appointment in: 9 months ( January 2025) ** please call the office in November to arrange your follow up appointment. **  If you have any questions or concerns before your next appointment please send [place] a message through [place] or call our office at [phone].    TO LEAVE A MESSAGE FOR THE NURSE SELECT OPTION 2, PLEASE LEAVE A MESSAGE INCLUDING: YOUR NAME DATE OF BIRTH CALL BACK NUMBER REASON FOR CALL**this is important as we prioritize the call backs  YOU WILL RECEIVE A CALL BACK THE SAME DAY AS LONG AS YOU CALL BEFORE 4:00 PM  At the [facility], you and your health needs are our priority. As part of our continuing mission to provide you with exceptional heart care, we have created designated Provider Care Teams. These Care Teams include your primary Cardiologist (physician) and Advanced Practice Providers (APPs- Physician Assistants and Nurse Practitioners) who all work together to provide you with the care you need, when you need it.   You may see any of the following providers on your designated Care Team at your next follow up: Dr Glori Bickers Dr Loralie Champagne Dr. Roxana Hires, NP Lyda Jester, [place] [facility] [place], [place] Forestine Na, NP Audry Riles, PharmD   Please be sure to bring in all your medications bottles to every appointment.    Thank you for choosing [facility]

## 2023-01-15 DIAGNOSIS — M0589 Other rheumatoid arthritis with rheumatoid factor of multiple sites: Secondary | ICD-10-CM | POA: Diagnosis not present

## 2023-01-29 DIAGNOSIS — H43813 Vitreous degeneration, bilateral: Secondary | ICD-10-CM | POA: Diagnosis not present

## 2023-01-29 DIAGNOSIS — E119 Type 2 diabetes mellitus without complications: Secondary | ICD-10-CM | POA: Diagnosis not present

## 2023-01-29 DIAGNOSIS — H04123 Dry eye syndrome of bilateral lacrimal glands: Secondary | ICD-10-CM | POA: Diagnosis not present

## 2023-01-29 DIAGNOSIS — H01021 Squamous blepharitis right upper eyelid: Secondary | ICD-10-CM | POA: Diagnosis not present

## 2023-03-28 DIAGNOSIS — E7800 Pure hypercholesterolemia, unspecified: Secondary | ICD-10-CM | POA: Diagnosis not present

## 2023-03-28 DIAGNOSIS — R5382 Chronic fatigue, unspecified: Secondary | ICD-10-CM | POA: Diagnosis not present

## 2023-03-28 DIAGNOSIS — M25551 Pain in right hip: Secondary | ICD-10-CM | POA: Diagnosis not present

## 2023-03-28 DIAGNOSIS — I5020 Unspecified systolic (congestive) heart failure: Secondary | ICD-10-CM | POA: Diagnosis not present

## 2023-03-28 DIAGNOSIS — M0589 Other rheumatoid arthritis with rheumatoid factor of multiple sites: Secondary | ICD-10-CM | POA: Diagnosis not present

## 2023-03-28 DIAGNOSIS — Z6824 Body mass index (BMI) 24.0-24.9, adult: Secondary | ICD-10-CM | POA: Diagnosis not present

## 2023-03-28 DIAGNOSIS — M810 Age-related osteoporosis without current pathological fracture: Secondary | ICD-10-CM | POA: Diagnosis not present

## 2023-03-28 DIAGNOSIS — M1991 Primary osteoarthritis, unspecified site: Secondary | ICD-10-CM | POA: Diagnosis not present

## 2023-04-12 ENCOUNTER — Other Ambulatory Visit (HOSPITAL_COMMUNITY): Payer: Self-pay | Admitting: Cardiology

## 2023-04-12 MED ORDER — APIXABAN 5 MG PO TABS: 5.0000 mg | ORAL_TABLET | Freq: Two times a day (BID) | ORAL | 5 refills | Status: DC

## 2023-05-17 DIAGNOSIS — N2889 Other specified disorders of kidney and ureter: Secondary | ICD-10-CM | POA: Diagnosis not present

## 2023-05-17 DIAGNOSIS — Z9889 Other specified postprocedural states: Secondary | ICD-10-CM | POA: Diagnosis not present

## 2023-05-17 DIAGNOSIS — S32040D Wedge compression fracture of fourth lumbar vertebra, subsequent encounter for fracture with routine healing: Secondary | ICD-10-CM | POA: Diagnosis not present

## 2023-05-28 DIAGNOSIS — I5022 Chronic systolic (congestive) heart failure: Secondary | ICD-10-CM | POA: Diagnosis not present

## 2023-05-28 DIAGNOSIS — Z6825 Body mass index (BMI) 25.0-25.9, adult: Secondary | ICD-10-CM | POA: Diagnosis not present

## 2023-05-28 DIAGNOSIS — Z79899 Other long term (current) drug therapy: Secondary | ICD-10-CM | POA: Diagnosis not present

## 2023-05-28 DIAGNOSIS — G4733 Obstructive sleep apnea (adult) (pediatric): Secondary | ICD-10-CM | POA: Diagnosis not present

## 2023-05-28 DIAGNOSIS — Z0000 Encounter for general adult medical examination without abnormal findings: Secondary | ICD-10-CM | POA: Diagnosis not present

## 2023-05-28 DIAGNOSIS — Z125 Encounter for screening for malignant neoplasm of prostate: Secondary | ICD-10-CM | POA: Diagnosis not present

## 2023-05-28 DIAGNOSIS — H9313 Tinnitus, bilateral: Secondary | ICD-10-CM | POA: Diagnosis not present

## 2023-05-28 DIAGNOSIS — Z1331 Encounter for screening for depression: Secondary | ICD-10-CM | POA: Diagnosis not present

## 2023-05-28 DIAGNOSIS — E1169 Type 2 diabetes mellitus with other specified complication: Secondary | ICD-10-CM | POA: Diagnosis not present

## 2023-05-28 DIAGNOSIS — E785 Hyperlipidemia, unspecified: Secondary | ICD-10-CM | POA: Diagnosis not present

## 2023-06-16 ENCOUNTER — Encounter: Payer: Self-pay | Admitting: Cardiology

## 2023-06-16 NOTE — Progress Notes (Signed)
He had another episode of symptomatic hypotension associated with enteritis.  I spoke to on the phone today blood pressure supine is in the high 70s low 80s.  Asked him to take stress dose steroids hold Entresto for 48 hours reassessed this evening if systolic greater than 110 standing can take half dose carvedilol.  He has had multiple episodes he is quite sensitive to the hypotensive effect of carvedilol probably would do better in the future switching to either extended release metoprolol or bisoprolol.  I am also going to give him a prescription for midodrine to have on hand that he can take for symptomatic hypotension in the future.

## 2023-06-17 ENCOUNTER — Telehealth (HOSPITAL_COMMUNITY): Payer: Self-pay | Admitting: Cardiology

## 2023-06-17 NOTE — Telephone Encounter (Signed)
PT AWARE  

## 2023-06-17 NOTE — Telephone Encounter (Signed)
Pt called to report bloody diarrhea 10/11, has held eliquis since 10/12, with holding eliquis no further bleeding however dark stools this morning Hx of hemorrhoids    Advised would review with provider for medications however would likely need to contact PCP for additional concerns regarding  bloody stools    Please advise further

## 2023-06-19 DIAGNOSIS — C642 Malignant neoplasm of left kidney, except renal pelvis: Secondary | ICD-10-CM | POA: Diagnosis not present

## 2023-06-19 DIAGNOSIS — C641 Malignant neoplasm of right kidney, except renal pelvis: Secondary | ICD-10-CM | POA: Diagnosis not present

## 2023-06-25 DIAGNOSIS — E1165 Type 2 diabetes mellitus with hyperglycemia: Secondary | ICD-10-CM | POA: Diagnosis not present

## 2023-06-25 DIAGNOSIS — I5022 Chronic systolic (congestive) heart failure: Secondary | ICD-10-CM | POA: Diagnosis not present

## 2023-06-25 DIAGNOSIS — Z6825 Body mass index (BMI) 25.0-25.9, adult: Secondary | ICD-10-CM | POA: Diagnosis not present

## 2023-06-25 DIAGNOSIS — K921 Melena: Secondary | ICD-10-CM | POA: Diagnosis not present

## 2023-06-26 DIAGNOSIS — K921 Melena: Secondary | ICD-10-CM | POA: Diagnosis not present

## 2023-06-27 DIAGNOSIS — D649 Anemia, unspecified: Secondary | ICD-10-CM | POA: Diagnosis not present

## 2023-06-28 DIAGNOSIS — D649 Anemia, unspecified: Secondary | ICD-10-CM | POA: Diagnosis not present

## 2023-07-04 DIAGNOSIS — D649 Anemia, unspecified: Secondary | ICD-10-CM | POA: Diagnosis not present

## 2023-07-05 ENCOUNTER — Other Ambulatory Visit (HOSPITAL_COMMUNITY): Payer: Self-pay | Admitting: Internal Medicine

## 2023-07-09 ENCOUNTER — Other Ambulatory Visit (HOSPITAL_COMMUNITY): Payer: Self-pay | Admitting: Adult Health

## 2023-09-16 DIAGNOSIS — D6869 Other thrombophilia: Secondary | ICD-10-CM | POA: Diagnosis not present

## 2023-09-16 DIAGNOSIS — E1165 Type 2 diabetes mellitus with hyperglycemia: Secondary | ICD-10-CM | POA: Diagnosis not present

## 2023-09-16 DIAGNOSIS — I700 Atherosclerosis of aorta: Secondary | ICD-10-CM | POA: Diagnosis not present

## 2023-09-16 DIAGNOSIS — Z79899 Other long term (current) drug therapy: Secondary | ICD-10-CM | POA: Diagnosis not present

## 2023-09-16 DIAGNOSIS — G4733 Obstructive sleep apnea (adult) (pediatric): Secondary | ICD-10-CM | POA: Diagnosis not present

## 2023-09-16 DIAGNOSIS — I5022 Chronic systolic (congestive) heart failure: Secondary | ICD-10-CM | POA: Diagnosis not present

## 2023-09-16 DIAGNOSIS — D649 Anemia, unspecified: Secondary | ICD-10-CM | POA: Diagnosis not present

## 2023-09-16 DIAGNOSIS — I480 Paroxysmal atrial fibrillation: Secondary | ICD-10-CM | POA: Diagnosis not present

## 2023-09-16 DIAGNOSIS — Z6824 Body mass index (BMI) 24.0-24.9, adult: Secondary | ICD-10-CM | POA: Diagnosis not present

## 2023-09-16 DIAGNOSIS — E785 Hyperlipidemia, unspecified: Secondary | ICD-10-CM | POA: Diagnosis not present

## 2023-09-27 DIAGNOSIS — Z23 Encounter for immunization: Secondary | ICD-10-CM | POA: Diagnosis not present

## 2023-10-08 ENCOUNTER — Telehealth: Payer: Self-pay | Admitting: *Deleted

## 2023-10-08 DIAGNOSIS — Z8679 Personal history of other diseases of the circulatory system: Secondary | ICD-10-CM | POA: Diagnosis not present

## 2023-10-08 DIAGNOSIS — K625 Hemorrhage of anus and rectum: Secondary | ICD-10-CM | POA: Diagnosis not present

## 2023-10-08 DIAGNOSIS — D508 Other iron deficiency anemias: Secondary | ICD-10-CM | POA: Diagnosis not present

## 2023-10-08 NOTE — Telephone Encounter (Signed)
   Pre-operative Risk Assessment    Patient Name: Ethan Alexander  DOB: 06/30/1951 MRN: [id number]   Date of last office visit: 11/29/2015 Date of next office visit: None   Request for Surgical Clearance    Procedure:   Colonoscopy/Endoscopy  Date of Surgery:  Clearance 10/30/23                                 Surgeon:  Dr. Layla Lah Surgeon's Group or Practice Name:  [facility] Phone number:  [phone] Fax number:  [phone]   Type of Clearance Requested:   - Medical  - Pharmacy:  Hold Apixaban  (Eliquis ) Not Indicated.   Type of Anesthesia:   Propofol    Additional requests/questions:    Signed, Edsel Grayce Sanders   10/08/2023, 12:25 PM

## 2023-10-08 NOTE — Telephone Encounter (Signed)
 Patient with diagnosis of PAF on Eliquis  for anticoagulation.    Procedure: Colonoscopy/Endoscopy  Date of procedure: 10/30/23   CHA2DS2-VASc Score = 4   This indicates a 4.8% annual risk of stroke. The patient's score is based upon: CHF History: 1 HTN History: 1 Diabetes History: 1 Stroke History: 0 Vascular Disease History: 0 Age Score: 1 Gender Score: 0     CrCl 78 mL/min Platelet count 224 K    Per office protocol, patient can hold Eliquis  for 2 days prior to procedure.     **This guidance is not considered finalized until pre-operative APP has relayed final recommendations.**

## 2023-10-09 ENCOUNTER — Telehealth: Payer: Self-pay | Admitting: *Deleted

## 2023-10-09 NOTE — Telephone Encounter (Signed)
   Name: Ethan Alexander  DOB: 04/24/1951  MRN: [id number]  Primary Cardiologist: None   Preoperative team, please contact this patient and set up a phone call appointment for further preoperative risk assessment. Please obtain consent and complete medication review. Thank you for your help.  I confirm that guidance regarding antiplatelet and oral anticoagulation therapy has been completed and, if necessary, noted below.  Per office protocol, patient can hold Eliquis  for 2 days prior to procedure.    I also confirmed the patient resides in the state of [place] . As per [facility] Medical Board telemedicine laws, the patient must reside in the state in which the provider is licensed.   Wyn Raddle, Jackee Shove, NP 10/09/2023, 7:03 AM [facility]

## 2023-10-09 NOTE — Telephone Encounter (Signed)
 S/w the pt and he has been scheduled tele preop appt 10/22/23. Med rec and consent ar done

## 2023-10-09 NOTE — Telephone Encounter (Signed)
 S/w the pt and he has been scheduled tele preop appt 10/22/23. Med rec and consent ar done.     Patient Consent for Virtual Visit        Ethan Alexander has provided verbal consent on 10/09/2023 for a virtual visit (video or telephone).   CONSENT FOR VIRTUAL VISIT FOR:  Ethan Alexander  By participating in this virtual visit I agree to the following:  I hereby voluntarily request, consent and authorize [facility] and its employed or contracted physicians, physician assistants, nurse practitioners or other licensed health care professionals (the Practitioner), to provide me with telemedicine health care services (the "Services) as deemed necessary by the treating Practitioner. I acknowledge and consent to receive the Services by the Practitioner via telemedicine. I understand that the telemedicine visit will involve communicating with the Practitioner through live audiovisual communication technology and the disclosure of certain medical information by electronic transmission. I acknowledge that I have been given the opportunity to request an in-person assessment or other available alternative prior to the telemedicine visit and am voluntarily participating in the telemedicine visit.  I understand that I have the right to withhold or withdraw my consent to the use of telemedicine in the course of my care at any time, without affecting my right to future care or treatment, and that the Practitioner or I may terminate the telemedicine visit at any time. I understand that I have the right to inspect all information obtained and/or recorded in the course of the telemedicine visit and may receive copies of available information for a reasonable fee.  I understand that some of the potential risks of receiving the Services via telemedicine include:  Delay or interruption in medical evaluation due to technological equipment failure or disruption; Information transmitted may not be sufficient  (e.g. poor resolution of images) to allow for appropriate medical decision making by the Practitioner; and/or  In rare instances, security protocols could fail, causing a breach of personal health information.  Furthermore, I acknowledge that it is my responsibility to provide information about my medical history, conditions and care that is complete and accurate to the best of my ability. I acknowledge that Practitioner's advice, recommendations, and/or decision may be based on factors not within their control, such as incomplete or inaccurate data provided by me or distortions of diagnostic images or specimens that may result from electronic transmissions. I understand that the practice of medicine is not an exact science and that Practitioner makes no warranties or guarantees regarding treatment outcomes. I acknowledge that a copy of this consent can be made available to me via my patient portal [facility] MyChart), or I can request a printed copy by calling the office of [facility].    I understand that my insurance will be billed for this visit.   I have read or had this consent read to me. I understand the contents of this consent, which adequately explains the benefits and risks of the Services being provided via telemedicine.  I have been provided ample opportunity to ask questions regarding this consent and the Services and have had my questions answered to my satisfaction. I give my informed consent for the services to be provided through the use of telemedicine in my medical care

## 2023-10-22 ENCOUNTER — Ambulatory Visit: Payer: Medicare Other | Attending: Cardiovascular Disease | Admitting: Student

## 2023-10-22 DIAGNOSIS — Z01810 Encounter for preprocedural cardiovascular examination: Secondary | ICD-10-CM | POA: Diagnosis not present

## 2023-10-22 NOTE — Progress Notes (Signed)
 Virtual Visit via Telephone Note   Because of Ethan Alexander co-morbid illnesses, he is at least at moderate risk for complications without adequate follow up.  This format is felt to be most appropriate for this patient at this time.  The patient did not have access to video technology/had technical difficulties with video requiring transitioning to audio format only (telephone).  All issues noted in this document were discussed and addressed.  No physical exam could be performed with this format.  Please refer to the patient's chart for his consent to telehealth for [facility].  Evaluation Performed:  Preoperative cardiovascular risk assessment _____________   Date:  10/22/2023   Patient ID:  Ethan Alexander, Ethan Alexander 1950-10-24, MRN [id number] Patient Location:  Home Provider location:   Office  Primary Care Provider:  Philemon Kingdom, MD Primary Cardiologist:  Arvilla Meres, MD  Chief Complaint / Patient Profile   73 y.o. y/o male with a h/o chronic combined systolic and diastolic heart failure, pericarditis/pericardial effusion, LBBB, PAF/A-flutter on anticoagulation, hypertension, RA, T2DM who is pending colonoscopy/endoscopy by Dr. Levora Angel on 10/30/2023 and presents today for telephonic preoperative cardiovascular risk assessment.  History of Present Illness    Ethan Alexander is a 73 y.o. male who presents via audio/video conferencing for a telehealth visit today.  Pt was last seen in cardiology clinic on 11/30/2022 by Dr. Gala Romney.  At that time Ethan Alexander was stable from a cardiac standpoint.  The patient is now pending procedure as outlined above. Since his last visit, he is doing well. Patient denies shortness of breath, dyspnea on exertion, lower extremity edema, orthopnea or PND. No chest pain, pressure, or tightness. No palpitations.  Patient keeps a close watch on his rhythm with his Apple watch. He checks an EKG strip once a week and has not been  alerted for afib. He is very active exercising on a stationary bike for 15-20 minutes daily.   Past Medical History    Past Medical History:  Diagnosis Date   Arthritis    CHF (congestive heart failure) (HCC)    Diabetes mellitus    diet controlled   Foot drop, left    Hypertension    Osteomyelitis (HCC) 2010   left foot   Osteomyelitis (HCC) 2008/2010   left  foot- metatarsal- per patient was MRSA   Renal disorder    Ruptured lumbar disc    L5-S1   x 2 disc   Umbilical hernia    Past Surgical History:  Procedure Laterality Date   ACHILLES TENDON SURGERY     left   CARDIAC CATHETERIZATION N/A 03/25/2015   Procedure: Right Heart Cath;  Surgeon: Marykay Lex, MD;  Location: [facility] INVASIVE CV LAB;  Service: Cardiovascular;  Laterality: N/A;   CARDIAC CATHETERIZATION N/A 03/28/2015   Procedure: Left Heart Cath;  Surgeon: Dolores Patty, MD;  Location[facility] INVASIVE CV LAB;  Service: Cardiovascular;  Laterality: N/A;   CARDIOVERSION N/A 03/27/2015   Procedure: CARDIOVERSION;  Surgeon: Dolores Patty, MD;  Location: [facility] OR;  Service: Cardiovascular;  Laterality: N/A;   EYE SURGERY     cataract ectraction with IOL-  bilateral   FOOT SURGERY  2008/2010   x 2  left foot/ osteomyelitis   TOTAL HIP ARTHROPLASTY  06/11/2012   Procedure: TOTAL HIP ARTHROPLASTY;  Surgeon: Loanne Drilling, MD;  Location: [facility] ORS;  Service: Orthopedics;  Laterality: Left;    Allergies  Allergies  Allergen Reactions   Sulfa Drugs  Cross Reactors Other (See Comments)    Flu like symptoms    Tape     Tears his skin   Sulfa Antibiotics Other (See Comments)    Home Medications    Prior to Admission medications   Medication Sig Start Date End Date Taking? Authorizing Provider  Abatacept 125 MG/ML SOSY Inject 125 mg into the skin once a week.     [provider]  apixaban (ELIQUIS) 5 MG TABS tablet Take 1 tablet (5 mg total) by mouth 2 (two) times daily. 04/12/23   Bensimhon, Bevelyn Buckles, MD   atorvastatin (LIPITOR) 10 MG tablet Take 10 mg by mouth daily.    [provider]  carvedilol (COREG) 6.25 MG tablet TAKE 1 TABLET BY MOUTH EVERY MORNING AND 1.5 TABLET EVERY EVENING 07/09/23   Bensimhon, Bevelyn Buckles, MD  dapagliflozin propanediol (FARXIGA) 10 MG TABS tablet Take 10 mg by mouth daily.    [provider]  ENTRESTO 24-26 MG TAKE 1 TABLET BY MOUTH TWICE DAILY 07/08/23   Bensimhon, Bevelyn Buckles, MD  escitalopram (LEXAPRO) 10 MG tablet Take 10 mg by mouth daily.    [provider]  JANUVIA 100 MG tablet Take 100 mg by mouth daily. 10/04/23   [provider]  metFORMIN (GLUCOPHAGE-XR) 500 MG 24 hr tablet Take 500 mg by mouth daily with breakfast.    [provider]  predniSONE (DELTASONE) 10 MG tablet Take 1 tablet (10 mg total) by mouth daily with breakfast. 11/21/15   Bensimhon, Bevelyn Buckles, MD    Physical Exam    Vital Signs:  Ethan Alexander does not have vital signs available for review today.  Given telephonic nature of communication, physical exam is limited. AAOx3. NAD. Normal affect.  Speech and respirations are unlabored.  Assessment & Plan    Primary Cardiologist: Arvilla Meres, MD  Preoperative cardiovascular risk assessment.  Colonoscopy/endoscopy by Dr. Levora Angel on 10/30/2023.  Chart reviewed as part of pre-operative protocol coverage. According to the RCRI, patient has a 0.9% risk of MACE. Patient reports activity equivalent to >4.0 METS (exercise bicycle 15-20 minutes daily).   Given past medical history and time since last visit, based on ACC/AHA guidelines, Ethan Alexander would be at acceptable risk for the planned procedure without further cardiovascular testing.   Patient was advised that if he develops new symptoms prior to surgery to contact our office to arrange a follow-up appointment.  he verbalized understanding.  Patient reports he is no longer taking Eliquis secondary to bleeding issues.    I will route this  recommendation to the requesting party via Epic fax function.  Please call with questions.  Time:   Today, I have spent 5 minutes with the patient with telehealth technology discussing medical history, symptoms, and management plan.     Ethan Levering, NP  10/22/2023, 1:46 PM

## 2023-10-30 DIAGNOSIS — D509 Iron deficiency anemia, unspecified: Secondary | ICD-10-CM | POA: Diagnosis not present

## 2023-10-30 DIAGNOSIS — K2980 Duodenitis without bleeding: Secondary | ICD-10-CM | POA: Diagnosis not present

## 2023-10-30 DIAGNOSIS — K621 Rectal polyp: Secondary | ICD-10-CM | POA: Diagnosis not present

## 2023-10-30 DIAGNOSIS — K2270 Barrett's esophagus without dysplasia: Secondary | ICD-10-CM | POA: Diagnosis not present

## 2023-10-30 DIAGNOSIS — K2940 Chronic atrophic gastritis without bleeding: Secondary | ICD-10-CM | POA: Diagnosis not present

## 2023-10-30 DIAGNOSIS — K625 Hemorrhage of anus and rectum: Secondary | ICD-10-CM | POA: Diagnosis not present

## 2023-10-30 DIAGNOSIS — K5730 Diverticulosis of large intestine without perforation or abscess without bleeding: Secondary | ICD-10-CM | POA: Diagnosis not present

## 2023-10-30 DIAGNOSIS — K31A19 Gastric intestinal metaplasia without dysplasia, unspecified site: Secondary | ICD-10-CM | POA: Diagnosis not present

## 2023-10-30 DIAGNOSIS — K2930 Chronic superficial gastritis without bleeding: Secondary | ICD-10-CM | POA: Diagnosis not present

## 2023-11-15 DIAGNOSIS — M0589 Other rheumatoid arthritis with rheumatoid factor of multiple sites: Secondary | ICD-10-CM | POA: Diagnosis not present

## 2023-11-15 DIAGNOSIS — M25551 Pain in right hip: Secondary | ICD-10-CM | POA: Diagnosis not present

## 2023-11-15 DIAGNOSIS — M1991 Primary osteoarthritis, unspecified site: Secondary | ICD-10-CM | POA: Diagnosis not present

## 2023-11-15 DIAGNOSIS — C641 Malignant neoplasm of right kidney, except renal pelvis: Secondary | ICD-10-CM | POA: Diagnosis not present

## 2023-11-15 DIAGNOSIS — R5382 Chronic fatigue, unspecified: Secondary | ICD-10-CM | POA: Diagnosis not present

## 2023-11-15 DIAGNOSIS — M810 Age-related osteoporosis without current pathological fracture: Secondary | ICD-10-CM | POA: Diagnosis not present

## 2023-11-15 DIAGNOSIS — Z6824 Body mass index (BMI) 24.0-24.9, adult: Secondary | ICD-10-CM | POA: Diagnosis not present

## 2023-11-15 DIAGNOSIS — C642 Malignant neoplasm of left kidney, except renal pelvis: Secondary | ICD-10-CM | POA: Diagnosis not present

## 2023-11-20 DIAGNOSIS — C641 Malignant neoplasm of right kidney, except renal pelvis: Secondary | ICD-10-CM | POA: Diagnosis not present

## 2024-01-07 ENCOUNTER — Telehealth (HOSPITAL_COMMUNITY): Payer: Self-pay | Admitting: Internal Medicine

## 2024-01-07 NOTE — Telephone Encounter (Signed)
 Called to confirm/remind patient of their appointment at the Advanced Heart Failure Clinic on 01/07/2024.   Appointment:   [] Confirmed  [x] Left mess   [] No answer/No voice mail  [] VM Full/unable to leave message  [] Phone not in service  Patient reminded to bring all medications and/or complete list.  Confirmed patient has transportation. Gave directions, instructed to utilize valet parking.

## 2024-01-08 ENCOUNTER — Ambulatory Visit (HOSPITAL_COMMUNITY)
Admission: RE | Admit: 2024-01-08 | Discharge: 2024-01-08 | Disposition: A | Discharge status: Home / Self Care | Source: Ambulatory Visit | Attending: Internal Medicine | Admitting: Internal Medicine

## 2024-01-08 VITALS — BP 110/80 | HR 76 | Wt 211.8 lb

## 2024-01-08 DIAGNOSIS — Z7952 Long term (current) use of systemic steroids: Secondary | ICD-10-CM | POA: Diagnosis not present

## 2024-01-08 DIAGNOSIS — I110 Hypertensive heart disease with heart failure: Secondary | ICD-10-CM | POA: Insufficient documentation

## 2024-01-08 DIAGNOSIS — Z85528 Personal history of other malignant neoplasm of kidney: Secondary | ICD-10-CM | POA: Insufficient documentation

## 2024-01-08 DIAGNOSIS — M069 Rheumatoid arthritis, unspecified: Secondary | ICD-10-CM | POA: Insufficient documentation

## 2024-01-08 DIAGNOSIS — Z8249 Family history of ischemic heart disease and other diseases of the circulatory system: Secondary | ICD-10-CM | POA: Diagnosis not present

## 2024-01-08 DIAGNOSIS — Z7984 Long term (current) use of oral hypoglycemic drugs: Secondary | ICD-10-CM | POA: Diagnosis not present

## 2024-01-08 DIAGNOSIS — I447 Left bundle-branch block, unspecified: Secondary | ICD-10-CM | POA: Diagnosis not present

## 2024-01-08 DIAGNOSIS — I459 Conduction disorder, unspecified: Secondary | ICD-10-CM | POA: Diagnosis not present

## 2024-01-08 DIAGNOSIS — I4892 Unspecified atrial flutter: Secondary | ICD-10-CM | POA: Insufficient documentation

## 2024-01-08 DIAGNOSIS — E1169 Type 2 diabetes mellitus with other specified complication: Secondary | ICD-10-CM | POA: Insufficient documentation

## 2024-01-08 DIAGNOSIS — I493 Ventricular premature depolarization: Secondary | ICD-10-CM | POA: Insufficient documentation

## 2024-01-08 DIAGNOSIS — I3139 Other pericardial effusion (noninflammatory): Secondary | ICD-10-CM | POA: Insufficient documentation

## 2024-01-08 DIAGNOSIS — I480 Paroxysmal atrial fibrillation: Secondary | ICD-10-CM | POA: Diagnosis present

## 2024-01-08 DIAGNOSIS — G4733 Obstructive sleep apnea (adult) (pediatric): Secondary | ICD-10-CM | POA: Insufficient documentation

## 2024-01-08 DIAGNOSIS — Z79899 Other long term (current) drug therapy: Secondary | ICD-10-CM | POA: Insufficient documentation

## 2024-01-08 DIAGNOSIS — I5022 Chronic systolic (congestive) heart failure: Secondary | ICD-10-CM | POA: Diagnosis not present

## 2024-01-08 NOTE — Patient Instructions (Signed)
 Great to see you today!!!  You have been referred to Dr Marven Slimmer, his office will call you to schedule  Your physician recommends that you schedule a follow-up appointment in: 6 months with an echocardiogram (November), **PLEASE CALL OUR OFFICE IN SEPTEMBER TO SCHEDULE THIS APPOINTMENT  If you have any questions or concerns before your next appointment please send us  a message through [place] or call our office at [phone].    TO LEAVE A MESSAGE FOR THE NURSE SELECT OPTION 2, PLEASE LEAVE A MESSAGE INCLUDING: YOUR NAME DATE OF BIRTH CALL BACK NUMBER REASON FOR CALL**this is important as we prioritize the call backs  YOU WILL RECEIVE A CALL BACK THE SAME DAY AS LONG AS YOU CALL BEFORE 4:00 PM  At the [facility] Clinic, you and your health needs are our priority. As part of our continuing mission to provide you with exceptional heart care, we have created designated Provider Care Teams. These Care Teams include your primary Cardiologist (physician) and Advanced Practice Providers (APPs- Physician Assistants and Nurse Practitioners) who all work together to provide you with the care you need, when you need it.   You may see any of the following providers on your designated Care Team at your next follow up: Dr Jules Oar Dr Peder Bourdon Dr. Alwin Baars Dr. Arta Lark Amy Marijane Shoulders, NP Ruddy Corral, [place] [facility] [place], [place] Dennise Fitz, NP [place] Lee, NP [place] Dellen, NP Luster Salters, PharmD Bevely Brush, PharmD   Please be sure to bring in all your medications bottles to every appointment.    Thank you for choosing [facility]

## 2024-01-08 NOTE — Progress Notes (Addendum)
 Patient ID: Ethan Alexander, male   DOB: 1950-11-11, 73 y.o.   MRN: [id number]   Advanced Heart Failure Clinic Note   Primary Care: Yolande Hench Primary HF Cardiologist: Denina Rieger Rheumatology: Ebbie Goldmann EP: Dr. Rodolfo Clan   Chief complaint: HF  HPI: Ethan Alexander is a 73 y.o. with hx of systolic HF (EF 30%), RA (on chronic prednisone  - followed by Dr. Alva Jewels), DM2, LBBB and PAF.   Was admitted on 03/26/15 with CP and found to have moderate pericardial effusion, with thickened RV free wall and EF 30%. CT of chest negative for PE. He was taken to cath lab no evidence of tamponade. Normal coronaries.  Developed A fib RVR and was started on amio + cardizem  drip.  He was cardioverted and placed in oral amiodarone .     In August 2016 he presented to clinic with AFL but spontaneously converted. Amio increased to 400 bid. Bedside echo 40-45%. Pericardial effusion essentially resolved. Wore 30-day monitor. Amio decreased to 200 bid.  Failed sleep study with AHI 76. Now on CPAP.  S/p pan-metatarsal resection on the left foot at [facility] Had partial R kidney resection in 1/19 and was benign   In 9/22 found to have lesions in both kidneys. Found to have RCC on one side. Underwent coil embolization and freezing.   Echo 05/30/21 EF 40-45% RV normal. Personally reviewed  -30 day event monitor with no AF on amio in 9/16 --Has seen Dr. Rodolfo Clan in 3/17 and amio stopped --No evidence recurrent AF. - Eliquis  stopped with hematuria and RCC.    Zio placed for 2 weeks 12/23. No A fib. 6.4 % PVCs noted. He was set up for ECHO.    Echo 2//24 EF 45-50%  Had GI bleed in 10/24.Got 1u PRBCs. Had EGD/colon that was ok Eliquis  stopped. Has been following HR on AppleWatch and no AF. Feels good. Still riding stationary 9mins/day and walking on beach.    Cardiac Testing 03/29/2015 cMRI EF 36% mild hyperenhancement suggestive of myocarditis. Moderate pericardial effusion  Bedside echo 8/16: 40-45% pericardial effusion  resolved Echo 08/03/15: EF 40-45% in setting of LBBB and septal dyssynchrony. RV normal. No pericardial effusion.  Strain - 17%  Echo 7/17 EF 45-50% Echo 8/18 EF 50-55% no effusion.  Echo 2022 EF 40-45% RV normal.  RHC 03/25/2015 RA 9   RV 33/6 (21)   PCWP 9   Thermo 5.8/2.5   Fick 4.2/1.8  LHC 03/28/2015--> normal cors   9/16: 30-day event monitor no AFL    Past Medical History:  Diagnosis Date   Arthritis    CHF (congestive heart failure) (HCC)    Diabetes mellitus    diet controlled   Foot drop, left    Hypertension    Osteomyelitis (HCC) 2010   left foot   Osteomyelitis (HCC) 2008/2010   left  foot- metatarsal- per patient was MRSA   Renal disorder    Ruptured lumbar disc    L5-S1   x 2 disc   Umbilical hernia     Current Outpatient Medications  Medication Sig Dispense Refill   Abatacept  125 MG/ML SOSY Inject 125 mg into the skin once a week. Takes on Wednesdays     atorvastatin (LIPITOR) 10 MG tablet Take 10 mg by mouth daily.     carvedilol  (COREG ) 6.25 MG tablet TAKE 1 TABLET BY MOUTH EVERY MORNING AND 1.5 TABLET EVERY EVENING (Patient taking differently: Take 6.25 mg by mouth 2 (two) times daily with a meal.) 180 tablet 3  dapagliflozin propanediol (FARXIGA) 10 MG TABS tablet Take 10 mg by mouth daily.     ENTRESTO  24-26 MG TAKE 1 TABLET BY MOUTH TWICE DAILY 60 tablet 11   escitalopram (LEXAPRO) 10 MG tablet Take 10 mg by mouth daily.     JANUVIA 100 MG tablet Take 100 mg by mouth daily.     metFORMIN  (GLUCOPHAGE -XR) 500 MG 24 hr tablet Take 500 mg by mouth daily with breakfast.     predniSONE  (DELTASONE ) 10 MG tablet Take 1 tablet (10 mg total) by mouth daily with breakfast. 90 tablet 2   No current facility-administered medications for this encounter.    Allergies  Allergen Reactions   Sulfa Drugs Cross Reactors Other (See Comments)    Flu like symptoms    Tape     Tears his skin   Sulfa Antibiotics Other (See Comments)      Social History    Socioeconomic History   Marital status: Married    Spouse name: Not on file   Number of children: Not on file   Years of education: Not on file   Highest education level: Not on file  Occupational History   Not on file  Tobacco Use   Smoking status: Former    Current packs/day: 0.00    Average packs/day: 1 pack/day for 25.0 years (25.0 ttl pk-yrs)    Types: Cigarettes    Start date: 06/05/1981    Quit date: 06/05/2006    Years since quitting: 17.6   Smokeless tobacco: Never  Vaping Use   Vaping status: Never Used  Substance and Sexual Activity   Alcohol  use: Yes    Comment: socially   Drug use: No   Sexual activity: Not on file  Other Topics Concern   Not on file  Social History Narrative   Not on file   Social Drivers of Health   Financial Resource Strain: Not on file  Food Insecurity: No Food Insecurity (10/16/2021)   [facility] from [facility] visits prior to 11/03/2022., [facility] visits [facility] to [facility]03/2022[facility]   Hunger Vital Sign    Worried About Running Out of Food in the Last Year: Never true    Ran Out of Food in the Last Year: Never true  Transportation Needs: Not on file  Physical Activity: Not on file  Stress: Not on file  Social Connections: Not on file  Intimate Partner Violence: Not on file      Family History  Problem Relation Age of Onset   Heart disease Father     Vitals:   01/08/24 0959  BP: 110/80  Pulse: 76  SpO2: 96%  Weight: 96.1 kg (211 lb 12.8 oz)     Wt Readings from Last 3 Encounters:  01/08/24 96.1 kg (211 lb 12.8 oz)  11/30/22 97 kg (213 lb 12.8 oz)  08/31/22 98 kg (216 lb)   PHYSICAL EXAM: General:  Well appearing. No resp difficulty HEENT: normal Neck: supple. no JVD. Carotids 2+ bilat; no bruits. No lymphadenopathy or thryomegaly appreciated. Cor: PMI nondisplaced. Regular rate & rhythm. No rubs, gallops or murmurs. Lungs: clear Abdomen: soft,  nontender, nondistended. No hepatosplenomegaly. No bruits or masses. Good bowel sounds. Extremities: no cyanosis, clubbing, rash, edema + arthritis change LLE brace Neuro: alert & orientedx3, cranial nerves grossly intact. moves all 4 extremities w/o difficulty. Affect pleasant    ASSESSMENT & PLAN:  1. Paroxysmal Atrial fibrillation/flutter  - Initial episode in 2016  s/p DC-CV - Has seen Dr. Rodolfo Clan in 3/17 and amio stopped - 30 day event monitor with no AF on amio in 9/16 - 12/23 Zio. No A fib.  - Off Eliquis  with recent GI bleed.. - No AF on AppleWatch (follows closely) - We discussed options. He is interested in Estate agent if he is a candidate (off amio since 2017 with no documented recurrence in setting of CPAP therapy). Will refer to Dr. Marven Slimmer in EP  2. Chronic systolic HF   - cMRI 7/16 EF 36%. EF ~45-50% 08/03/15.  - 03/28/15 LHC normal cors.   - Echo 8/18 EF 50-55% no effusion - Echo  05/30/21 EF 40-45% RV normal.  - Echo 2//24 EF 45-50% - Continues to do well. NYHA I  - Continue Entresto  24-26 mg bid  - Continue Farxiga 10 mg  - Continue Coreg  6.25/12.5 - No spironolactone with soft BP.  - Will repeat echo at next visit - Recent labs reviewed and are ok   3. Pericardial effusion, moderate   - resolved  4. RA   - Per Rheumatology.   5. DM2 with h/o osteomyelitis LLE   - followed by PCP  - Continue Farxiga   6. Chronic LBBB   - EF out of range for CRT-D  7. Renal cell carcinoma - S/p coil embolization  8. OSA - severe with AHI 76.  - Wears CPAP nightly  Jules Oar, MD 10:44 AM

## 2024-01-13 DIAGNOSIS — E785 Hyperlipidemia, unspecified: Secondary | ICD-10-CM | POA: Diagnosis not present

## 2024-01-13 DIAGNOSIS — E1165 Type 2 diabetes mellitus with hyperglycemia: Secondary | ICD-10-CM | POA: Diagnosis not present

## 2024-01-13 DIAGNOSIS — D509 Iron deficiency anemia, unspecified: Secondary | ICD-10-CM | POA: Diagnosis not present

## 2024-01-23 ENCOUNTER — Ambulatory Visit: Attending: Cardiology | Admitting: Cardiology

## 2024-01-23 VITALS — BP 90/62 | HR 78 | Ht 78.0 in | Wt 211.4 lb

## 2024-01-23 DIAGNOSIS — I480 Paroxysmal atrial fibrillation: Secondary | ICD-10-CM | POA: Insufficient documentation

## 2024-01-23 DIAGNOSIS — I5022 Chronic systolic (congestive) heart failure: Secondary | ICD-10-CM | POA: Insufficient documentation

## 2024-01-23 DIAGNOSIS — G4733 Obstructive sleep apnea (adult) (pediatric): Secondary | ICD-10-CM | POA: Insufficient documentation

## 2024-01-23 NOTE — Patient Instructions (Signed)

## 2024-01-23 NOTE — Progress Notes (Signed)
  Electrophysiology Office Note:    Date:  01/23/2024   ID:  Quamir, Willemsen [birth year], MRN [id number]  [facility] Cardiologist:  Jules Oar, MD  [facility] Electrophysiologist:  Boyce Byes, MD   Referring MD: Mardell Shade, MD   Chief Complaint: Atrial fibrillation  History of Present Illness:    Ethan Alexander is a 73 year old man who I am seeing today for an evaluation of atrial fibrillation at the request of Dr. Bensimhon.  The patient has a history of chronic systolic heart failure with an ejection fraction of 30%, rheumatoid arthritis on prednisone , diabetes, left bundle branch block and atrial fibrillation.  His atrial fibrillation was diagnosed in 2016.  He is on amiodarone .  He also has a history of GI bleeding in 2024 requiring blood transfusion.  His Eliquis  was stopped.  He has been off anticoagulation since the GI bleed.  He has been off amiodarone  since 2017 without known recurrence.  He follows his rhythm closely using an Apple Watch.  He presents to discuss options for stroke prophylaxis including continuing to hold anticoagulation versus left atrial appendage occlusion.  He confirms no longer taking OAC. No episode of AF since 2016.     Their past medical, social and family history was reviewed.   ROS:   Please see the history of present illness.    All other systems reviewed and are negative.  EKGs/Labs/Other Studies Reviewed:    The following studies were reviewed today:  February 2024 echo EF 45% RV normal No significant valvular abnormalities       Physical Exam:    VS:  BP 90/62 (BP Location: Left Arm, Patient Position: Sitting)   Pulse 78   Ht 6\' 6"  (1.981 m)   Wt 211 lb 6.4 oz (95.9 kg)   SpO2 96%   BMI 24.43 kg/m     Wt Readings from Last 3 Encounters:  01/23/24 211 lb 6.4 oz (95.9 kg)  01/08/24 211 lb 12.8 oz (96.1 kg)  11/30/22 213 lb 12.8 oz (97 kg)     GEN: no distress CARD: RRR, No MRG RESP: No IWOB.  CTAB.        ASSESSMENT AND PLAN:    1. Chronic systolic heart failure (HCC)   2. Paroxysmal atrial fibrillation (HCC)   3. OSA on CPAP     #Atrial fibrillation No recurrence since stopping amiodarone  in 2017.  Was diagnosed in the setting of pericardial effusion.  He has a history of GI bleeding requiring blood transfusion and is currently off anticoagulation.  We discussed his risk of stroke associated with his history of atrial fibrillation.  We also discussed alternatives to anticoagulation including left atrial appendage occlusion.  The patient is on prednisone  which further complicates the decision to pursue left atrial appendage occlusion given its impact on procedural risk.  In the absence of known atrial fibrillation recurrence, we discussed using a more conservative approach.  He is interested in this.  I would recommend continuing to monitor for atrial fibrillation recurrence using an Apple Watch or loop recorder.  If you were to have a known recurrence of atrial fibrillation, could consider left atrial appendage occlusion.  #Chronic systolic heart failure NYHA class I symptoms today.  Follows with Dr. Bensimhon.  #Obstructive sleep apnea Continue CPAP.  Follow-up with EP on an as-needed basis.      Signed, Leanora Prophet. Marven Slimmer, MD, [facility], [facility] 01/23/2024 12:01 PM    Electrophysiology  [facility]

## 2024-02-04 DIAGNOSIS — H353121 Nonexudative age-related macular degeneration, left eye, early dry stage: Secondary | ICD-10-CM | POA: Diagnosis not present

## 2024-02-04 DIAGNOSIS — H353112 Nonexudative age-related macular degeneration, right eye, intermediate dry stage: Secondary | ICD-10-CM | POA: Diagnosis not present

## 2024-02-04 DIAGNOSIS — H0100A Unspecified blepharitis right eye, upper and lower eyelids: Secondary | ICD-10-CM | POA: Diagnosis not present

## 2024-02-04 DIAGNOSIS — E119 Type 2 diabetes mellitus without complications: Secondary | ICD-10-CM | POA: Diagnosis not present

## 2024-02-04 DIAGNOSIS — H43813 Vitreous degeneration, bilateral: Secondary | ICD-10-CM | POA: Diagnosis not present

## 2024-02-04 DIAGNOSIS — H0100B Unspecified blepharitis left eye, upper and lower eyelids: Secondary | ICD-10-CM | POA: Diagnosis not present

## 2024-05-15 DIAGNOSIS — M0589 Other rheumatoid arthritis with rheumatoid factor of multiple sites: Secondary | ICD-10-CM | POA: Diagnosis not present

## 2024-05-15 DIAGNOSIS — M1991 Primary osteoarthritis, unspecified site: Secondary | ICD-10-CM | POA: Diagnosis not present

## 2024-05-15 DIAGNOSIS — R5382 Chronic fatigue, unspecified: Secondary | ICD-10-CM | POA: Diagnosis not present

## 2024-05-15 DIAGNOSIS — M810 Age-related osteoporosis without current pathological fracture: Secondary | ICD-10-CM | POA: Diagnosis not present

## 2024-05-15 DIAGNOSIS — M25551 Pain in right hip: Secondary | ICD-10-CM | POA: Diagnosis not present

## 2024-05-15 DIAGNOSIS — Z6824 Body mass index (BMI) 24.0-24.9, adult: Secondary | ICD-10-CM | POA: Diagnosis not present

## 2024-07-02 DIAGNOSIS — Z125 Encounter for screening for malignant neoplasm of prostate: Secondary | ICD-10-CM | POA: Diagnosis not present

## 2024-07-02 DIAGNOSIS — E785 Hyperlipidemia, unspecified: Secondary | ICD-10-CM | POA: Diagnosis not present

## 2024-07-02 DIAGNOSIS — Z79899 Other long term (current) drug therapy: Secondary | ICD-10-CM | POA: Diagnosis not present

## 2024-07-02 DIAGNOSIS — E1169 Type 2 diabetes mellitus with other specified complication: Secondary | ICD-10-CM | POA: Diagnosis not present

## 2024-07-20 DIAGNOSIS — L814 Other melanin hyperpigmentation: Secondary | ICD-10-CM | POA: Diagnosis not present

## 2024-07-20 DIAGNOSIS — L578 Other skin changes due to chronic exposure to nonionizing radiation: Secondary | ICD-10-CM | POA: Diagnosis not present

## 2024-07-20 DIAGNOSIS — C44629 Squamous cell carcinoma of skin of left upper limb, including shoulder: Secondary | ICD-10-CM | POA: Diagnosis not present

## 2024-07-20 DIAGNOSIS — L821 Other seborrheic keratosis: Secondary | ICD-10-CM | POA: Diagnosis not present

## 2024-07-20 DIAGNOSIS — D225 Melanocytic nevi of trunk: Secondary | ICD-10-CM | POA: Diagnosis not present

## 2024-07-28 ENCOUNTER — Other Ambulatory Visit (HOSPITAL_COMMUNITY): Payer: Self-pay | Admitting: Cardiology

## 2024-07-28 MED ORDER — CARVEDILOL 6.25 MG PO TABS: ORAL_TABLET | ORAL | 11 refills | Status: AC

## 2024-08-04 ENCOUNTER — Other Ambulatory Visit (HOSPITAL_COMMUNITY): Payer: Self-pay | Admitting: Cardiology

## 2024-08-04 MED ORDER — SACUBITRIL-VALSARTAN 24-26 MG PO TABS: 1.0000 | ORAL_TABLET | Freq: Two times a day (BID) | ORAL | 11 refills | Status: AC

## 2024-08-05 DIAGNOSIS — C44629 Squamous cell carcinoma of skin of left upper limb, including shoulder: Secondary | ICD-10-CM | POA: Diagnosis not present

## 2024-08-11 DIAGNOSIS — Z23 Encounter for immunization: Secondary | ICD-10-CM | POA: Diagnosis not present

## 2024-08-22 ENCOUNTER — Other Ambulatory Visit: Payer: Self-pay

## 2024-08-22 ENCOUNTER — Emergency Department (HOSPITAL_BASED_OUTPATIENT_CLINIC_OR_DEPARTMENT_OTHER)

## 2024-08-22 ENCOUNTER — Emergency Department (HOSPITAL_BASED_OUTPATIENT_CLINIC_OR_DEPARTMENT_OTHER)
Admission: EM | Admit: 2024-08-22 | Discharge: 2024-08-22 | Disposition: A | Discharge status: Home / Self Care | Attending: Emergency Medicine | Admitting: Emergency Medicine

## 2024-08-22 ENCOUNTER — Encounter (HOSPITAL_BASED_OUTPATIENT_CLINIC_OR_DEPARTMENT_OTHER): Payer: Self-pay | Admitting: Emergency Medicine

## 2024-08-22 DIAGNOSIS — E119 Type 2 diabetes mellitus without complications: Secondary | ICD-10-CM | POA: Insufficient documentation

## 2024-08-22 DIAGNOSIS — Z79899 Other long term (current) drug therapy: Secondary | ICD-10-CM | POA: Insufficient documentation

## 2024-08-22 DIAGNOSIS — S51819A Laceration without foreign body of unspecified forearm, initial encounter: Secondary | ICD-10-CM

## 2024-08-22 DIAGNOSIS — S62347A Nondisplaced fracture of base of fifth metacarpal bone. left hand, initial encounter for closed fracture: Secondary | ICD-10-CM | POA: Diagnosis not present

## 2024-08-22 DIAGNOSIS — I509 Heart failure, unspecified: Secondary | ICD-10-CM | POA: Diagnosis not present

## 2024-08-22 DIAGNOSIS — S61411A Laceration without foreign body of right hand, initial encounter: Secondary | ICD-10-CM | POA: Diagnosis not present

## 2024-08-22 DIAGNOSIS — Z87891 Personal history of nicotine dependence: Secondary | ICD-10-CM | POA: Insufficient documentation

## 2024-08-22 DIAGNOSIS — M79642 Pain in left hand: Secondary | ICD-10-CM | POA: Diagnosis present

## 2024-08-22 DIAGNOSIS — Z7984 Long term (current) use of oral hypoglycemic drugs: Secondary | ICD-10-CM | POA: Insufficient documentation

## 2024-08-22 DIAGNOSIS — I110 Hypertensive heart disease with heart failure: Secondary | ICD-10-CM | POA: Insufficient documentation

## 2024-08-22 DIAGNOSIS — Y92410 Unspecified street and highway as the place of occurrence of the external cause: Secondary | ICD-10-CM | POA: Diagnosis not present

## 2024-08-22 DIAGNOSIS — S51812A Laceration without foreign body of left forearm, initial encounter: Secondary | ICD-10-CM | POA: Insufficient documentation
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
## 2024-08-22 NOTE — ED Notes (Signed)
 ED Provider at bedside.

## 2024-08-22 NOTE — Discharge Instructions (Addendum)
 It was a pleasure caring for you today in the emergency department.  Please call orthopedics arrange follow-up in the next week or 2.  Please keep your wounds clean and dry, you can remove the initial bandages after 24 hours to change the dressing and can clean the wound with gentle soap and water.    Please return to the emergency department for any worsening or worrisome symptoms.

## 2024-08-28 ENCOUNTER — Telehealth: Payer: Self-pay | Admitting: Cardiology

## 2024-08-28 NOTE — Telephone Encounter (Signed)
 Call from his wife that he had severe respiratory symptoms tested positive influenza requested Tamiflu.  I sent the prescription to the 24-hour pharmacy [facility].  His wife reports 2 days later he is beginning to feel better.  I also advised him to take extra steroid with chronic corticosteroid therapy for his rheumatoid arthritis.  Prior to prescription I did a quick check and no contraindications or interactions of concerns with Tamiflu and his cardiac medications.
